# Patient Record
Sex: Female | Born: 1960 | Race: Black or African American | Hispanic: No | Marital: Single | Smoking: Former smoker
Health system: Southern US, Community
[De-identification: ages and names within clinical notes are randomized; demographics above are authoritative.]

## PROBLEM LIST (undated history)

## (undated) DIAGNOSIS — E039 Hypothyroidism, unspecified: Secondary | ICD-10-CM

## (undated) DIAGNOSIS — E785 Hyperlipidemia, unspecified: Secondary | ICD-10-CM

## (undated) DIAGNOSIS — I2510 Atherosclerotic heart disease of native coronary artery without angina pectoris: Secondary | ICD-10-CM

## (undated) DIAGNOSIS — I219 Acute myocardial infarction, unspecified: Secondary | ICD-10-CM

## (undated) DIAGNOSIS — F1010 Alcohol abuse, uncomplicated: Secondary | ICD-10-CM

## (undated) DIAGNOSIS — R7303 Prediabetes: Secondary | ICD-10-CM

## (undated) DIAGNOSIS — K429 Umbilical hernia without obstruction or gangrene: Secondary | ICD-10-CM

## (undated) DIAGNOSIS — F1410 Cocaine abuse, uncomplicated: Secondary | ICD-10-CM

## (undated) DIAGNOSIS — R7989 Other specified abnormal findings of blood chemistry: Secondary | ICD-10-CM

## (undated) DIAGNOSIS — M8580 Other specified disorders of bone density and structure, unspecified site: Secondary | ICD-10-CM

## (undated) DIAGNOSIS — I252 Old myocardial infarction: Secondary | ICD-10-CM

## (undated) DIAGNOSIS — K219 Gastro-esophageal reflux disease without esophagitis: Secondary | ICD-10-CM

## (undated) HISTORY — PX: CARDIAC CATHETERIZATION: SHX172

## (undated) HISTORY — DX: Old myocardial infarction: I25.2

## (undated) HISTORY — DX: Hypothyroidism, unspecified: E03.9

## (undated) HISTORY — DX: Alcohol abuse, uncomplicated: F10.10

## (undated) HISTORY — DX: Umbilical hernia without obstruction or gangrene: K42.9

## (undated) HISTORY — DX: Other specified disorders of bone density and structure, unspecified site: M85.80

## (undated) HISTORY — DX: Prediabetes: R73.03

## (undated) HISTORY — DX: Acute myocardial infarction, unspecified: I21.9

---

## 1979-10-10 HISTORY — PX: ABDOMINAL HYSTERECTOMY: SHX81

## 2004-11-14 ENCOUNTER — Ambulatory Visit: Payer: Self-pay | Admitting: Family Medicine

## 2004-12-13 ENCOUNTER — Emergency Department: Payer: Self-pay | Admitting: General Practice

## 2005-02-02 ENCOUNTER — Emergency Department: Payer: Self-pay | Admitting: General Practice

## 2005-05-08 ENCOUNTER — Emergency Department: Payer: Self-pay | Admitting: Unknown Physician Specialty

## 2006-02-23 ENCOUNTER — Emergency Department: Payer: Self-pay | Admitting: Unknown Physician Specialty

## 2008-08-31 ENCOUNTER — Emergency Department (HOSPITAL_BASED_OUTPATIENT_CLINIC_OR_DEPARTMENT_OTHER): Admission: EM | Admit: 2008-08-31 | Discharge: 2008-08-31 | Payer: Self-pay | Admitting: Emergency Medicine

## 2009-10-10 ENCOUNTER — Emergency Department (HOSPITAL_BASED_OUTPATIENT_CLINIC_OR_DEPARTMENT_OTHER): Admission: EM | Admit: 2009-10-10 | Discharge: 2009-10-10 | Payer: Self-pay | Admitting: Emergency Medicine

## 2010-06-19 ENCOUNTER — Emergency Department (HOSPITAL_BASED_OUTPATIENT_CLINIC_OR_DEPARTMENT_OTHER)
Admission: EM | Admit: 2010-06-19 | Discharge: 2010-06-20 | Payer: Self-pay | Source: Home / Self Care | Admitting: Emergency Medicine

## 2010-06-20 ENCOUNTER — Ambulatory Visit: Payer: Self-pay | Admitting: Diagnostic Radiology

## 2010-07-06 ENCOUNTER — Encounter: Payer: Self-pay | Admitting: Cardiology

## 2010-07-06 ENCOUNTER — Ambulatory Visit: Payer: Self-pay | Admitting: Cardiology

## 2010-07-06 ENCOUNTER — Encounter (INDEPENDENT_AMBULATORY_CARE_PROVIDER_SITE_OTHER): Payer: Self-pay | Admitting: *Deleted

## 2010-07-06 DIAGNOSIS — R079 Chest pain, unspecified: Secondary | ICD-10-CM

## 2010-07-06 DIAGNOSIS — I208 Other forms of angina pectoris: Secondary | ICD-10-CM

## 2010-07-06 DIAGNOSIS — F1910 Other psychoactive substance abuse, uncomplicated: Secondary | ICD-10-CM

## 2010-07-07 ENCOUNTER — Ambulatory Visit: Payer: Self-pay | Admitting: Cardiology

## 2010-07-07 ENCOUNTER — Inpatient Hospital Stay (HOSPITAL_BASED_OUTPATIENT_CLINIC_OR_DEPARTMENT_OTHER): Admission: RE | Admit: 2010-07-07 | Discharge: 2010-07-07 | Payer: Self-pay | Admitting: Cardiology

## 2010-07-07 LAB — CONVERTED CEMR LAB
BUN: 12 mg/dL (ref 6–23)
MCV: 80.8 fL (ref 78.0–100.0)
Platelets: 374 10*3/uL (ref 150–400)
Potassium: 4.5 meq/L (ref 3.5–5.3)
Prothrombin Time: 12.9 s (ref 11.6–15.2)
WBC: 7.1 10*3/uL (ref 4.0–10.5)

## 2010-07-13 ENCOUNTER — Ambulatory Visit: Payer: Self-pay | Admitting: Cardiology

## 2010-07-13 ENCOUNTER — Encounter: Payer: Self-pay | Admitting: Cardiology

## 2010-09-07 ENCOUNTER — Encounter: Payer: Self-pay | Admitting: Cardiology

## 2010-09-07 ENCOUNTER — Ambulatory Visit: Payer: Self-pay | Admitting: Cardiology

## 2010-10-09 DIAGNOSIS — I219 Acute myocardial infarction, unspecified: Secondary | ICD-10-CM

## 2010-10-09 HISTORY — DX: Acute myocardial infarction, unspecified: I21.9

## 2010-11-08 NOTE — Assessment & Plan Note (Signed)
Summary: [facility]   Visit Type:  Initial Consult  CC:  Sob and chest pains.  History of Present Illness: 50 year old female for evaluation of dyspnea. Seen in the emergency room on June 19, 2010 with a UTI and also dyspnea. Urinalysis revealed a UTI. Chest x-ray showed no acute abnormalities. Hemoglobin was 12.7. Renal function was normal. Cardiac markers was negative. Electrocardiogram revealed sinus bradycardia at a rate of 58. There were nonspecific anterior T wave changes. The patient states that over the past year she has had dyspnea on exertion relieved with rest. There is no orthopnea, PND, pedal edema or syncope. She also over the past 6 months has chest pain. It is substernal in location. It occurs with climbing stairs and resolves with rest. There is shortness of breath but no nausea vomiting or diaphoresis. She does not have these symptoms at rest. She does note occasional left shoulder pain but only since taking medications prescribed in the emergency room. She also has indigestion after eating certain types of food. Because of the above we were asked to further evaluate.  Current Medications (verified): 1)  Patient Does Not Take Any Medications R  Allergies (verified): 1)  ! * Ciprofloxacin 2)  ! Jonne Ply  Past History:  Past Medical History: No prior history  Past Surgical History: Partial hysterectomy  Family History: Reviewed history from 07/06/2010 and no changes required. Father died with CHF Mother with MI at age 33 and prior pacemaker  Social History: Reviewed history from 07/06/2010 and no changes required. She is a covering drug  (ETOH, Crack cocaine; none since January 2006). Former smoker (none since 2009) Former drinker Single 1 Daughter  Review of Systems       no fevers or chills, productive cough, hemoptysis, dysphasia, odynophagia, melena, hematochezia, dysuria, hematuria, rash, seizure activity, orthopnea, PND, pedal edema, claudication.  Remaining systems are negative.   Vital Signs:  Patient profile:   50 year old female Height:      66 inches Weight:      172.75 pounds BMI:     27.98 Pulse rate:   56 / minute Pulse rhythm:   regular Resp:     18 per minute BP sitting:   100 / 80  (left arm) Cuff size:   large  Vitals Entered By: Vikki Ports (July 06, 2010 10:31 AM)  Physical Exam  General:  Well developed/well nourished in NAD Skin warm/dry Patient not depressed No peripheral clubbing Back-normal HEENT-normal/normal eyelids Neck supple/normal carotid upstroke bilaterally; no bruits; no JVD; no thyromegaly chest - CTA/ normal expansion CV - RRR/normal S1 and S2; no murmurs, rubs or gallops;  PMI nondisplaced Abdomen -NT/ND, no HSM, no mass, + bowel sounds, no bruit 2+ femoral pulses, no bruits Ext-no edema, chords, 2+ DP Neuro-grossly nonfocal     EKG  Procedure date:  07/06/2010  Findings:      sinus bradycardia rate 56. Left axis deviation. Nonspecific T-wave changes.  Impression & Recommendations:  Problem # 1:  CHEST PAIN UNSPECIFIED (ICD-786.50) Patient's symptoms are concerning for coronary disease. She has a burning chest pain with climbing stairs which resolves with rest. She also has risk factors including prior tobacco use, cocaine use and a strong family history. Her electrocardiogram in the emergency room also showed anterior T-wave changes. I will begin enteric-coated aspirin 81 mg p.o. daily. We will proceed with left heart catheterization. The risks and benefits have been discussed including but not limited to myocardial infarction, CVA and death. She agrees to  proceed. This will be arranged in the outpatient laboratory. If coronary disease is demonstrated we will add a statin.  Problem # 2:  SUBSTANCE ABUSE (ICD-305.90) Now resolved for approximately 2 years.  Other Orders: T-Basic Metabolic Panel [phone]) T-CBC No Diff ([id number]) T-Protime, Auto  ([id number]) Cardiac Catheterization (Cardiac Cath)  Patient Instructions: 1)  Your physician recommends that you schedule a follow-up appointment in: 8 WEEKS 2)  Your physician has requested that you have a cardiac catheterization.  Cardiac catheterization is used to diagnose and/or treat various heart conditions. Doctors may recommend this procedure for a number of different reasons. The most common reason is to evaluate chest pain. Chest pain can be a symptom of coronary artery disease (CAD), and cardiac catheterization can show whether plaque is narrowing or blocking your heart's arteries. This procedure is also used to evaluate the valves, as well as measure the blood flow and oxygen levels in different parts of your heart.  For further information please visit [email or web address]  Please follow instruction sheet, as given.

## 2010-11-08 NOTE — Assessment & Plan Note (Signed)
Summary: [facility]   Visit Type:  Post-hospital  CC:  No complains.  History of Present Illness: 50 year old female I saw in Sept 2011 for evaluation of dyspnea and chest pain. Seen in the emergency room on June 19, 2010 with a UTI and also dyspnea. Urinalysis revealed a UTI. Chest x-ray showed no acute abnormalities. Hemoglobin was 12.7. Renal function was normal. Cardiac markers was negative. Electrocardiogram revealed sinus bradycardia at a rate of 58. There were nonspecific anterior T wave changes. We were concerned about her symptoms and scheduled a cardiac catheterization. This was performed in September of 2011 and revealed normal coronary arteries, normal LV function and normal pulmonary pressures. Since then the patient denies any dyspnea on exertion, orthopnea, PND, pedal edema, palpitations, syncope or chest pain.   Current Medications (verified): 1)  Patient Does Not Take Any Medications R  Allergies: 1)  ! * Ciprofloxacin 2)  ! Jonne Ply  Past History:  Past Medical History: Reviewed history from 07/06/2010 and no changes required. No prior history  Past Surgical History: Reviewed history from 07/06/2010 and no changes required. Partial hysterectomy  Social History: Reviewed history from 07/06/2010 and no changes required. She is a covering drug  (ETOH, Crack cocaine; none since January 2006). Former smoker (none since 2009) Former drinker Single 1 Daughter  Review of Systems       no fevers or chills, productive cough, hemoptysis, dysphasia, odynophagia, melena, hematochezia, dysuria, hematuria, rash, seizure activity, orthopnea, PND, pedal edema, claudication. Remaining systems are negative.   Vital Signs:  Patient profile:   50 year old female Height:      66 inches Weight:      174 pounds BMI:     28.19 Pulse rate:   68 / minute Pulse rhythm:   regular Resp:     18 per minute BP sitting:   120 / 70  (right arm) Cuff size:   large  Vitals  Entered By: Vikki Ports (July 13, 2010 10:28 AM)  Physical Exam  General:  Well-developed well-nourished in no acute distress.  Skin is warm and dry.  HEENT is normal.  Neck is supple. No thyromegaly.  Chest is clear to auscultation with normal expansion.  Cardiovascular exam is regular rate and rhythm.  Abdominal exam nontender or distended. No masses palpated. Right groin shows no hematoma and no bruit. Extremities show no edema. neuro grossly intact    Impression & Recommendations:  Problem # 1:  CHEST PAIN UNSPECIFIED (ICD-786.50) Patient's cardiac catheterization was normal. Her symptoms have resolved. No further workup.

## 2010-11-08 NOTE — Assessment & Plan Note (Signed)
Summary: [facility]   Visit Type:  Follow-up  CC:  Post-cath.  History of Present Illness: Pleasant female I saw in Sept 2011 for evaluation of dyspnea and chest pain. Seen in the emergency room on June 19, 2010 with a UTI and also dyspnea. Urinalysis revealed a UTI. Chest x-ray showed no acute abnormalities. Hemoglobin was 12.7. Renal function was normal. Cardiac markers was negative. Electrocardiogram revealed sinus bradycardia at a rate of 58. There were nonspecific anterior T wave changes. We were concerned about her symptoms and scheduled a cardiac catheterization. This was performed in September of 2011 and revealed normal coronary arteries, normal LV function and normal pulmonary pressures. I last saw her in Oct 2011. Since then, the patient denies any dyspnea on exertion, orthopnea, PND, pedal edema, palpitations, syncope or chest pain.    Current Medications (verified): 1)  Patient Does Not Take Any Medications R  Allergies: 1)  ! * Ciprofloxacin 2)  ! Jonne Ply  Past History:  Past Medical History: Reviewed history from 07/06/2010 and no changes required. No prior history  Past Surgical History: Reviewed history from 07/06/2010 and no changes required. Partial hysterectomy  Social History: Reviewed history from 07/06/2010 and no changes required. She is a covering drug  (ETOH, Crack cocaine; none since January 2006). Former smoker (none since 2009) Former drinker Single 1 Daughter  Review of Systems       no fevers or chills, productive cough, hemoptysis, dysphasia, odynophagia, melena, hematochezia, dysuria, hematuria, rash, seizure activity, orthopnea, PND, pedal edema, claudication. Remaining systems are negative.   Vital Signs:  Patient profile:   50 year old female Height:      66 inches Weight:      178 pounds BMI:     28.83 Pulse rate:   70 / minute Pulse rhythm:   regular Resp:     18 per minute BP sitting:   110 / 76  (left arm) Cuff size:    large  Vitals Entered By: Vikki Ports (September 07, 2010 10:26 AM)  Physical Exam  General:  Well-developed well-nourished in no acute distress.  Skin is warm and dry.  HEENT is normal.  Neck is supple. No thyromegaly.  Chest is clear to auscultation with normal expansion.  Cardiovascular exam is regular rate and rhythm.  Abdominal exam nontender or distended. No masses palpated. Extremities show no edema. neuro grossly intact    EKG  Procedure date:  09/07/2010  Findings:      Sinus rhythm with nonspecific anterior T-wave changes.  Impression & Recommendations:  Problem # 1:  CHEST PAIN UNSPECIFIED (ICD-786.50) No further symptoms. Cardiac catheterization normal. No further evaluation. I have given her Dr. Olegario Messier card and she will contact him for her primary care needs.

## 2010-11-08 NOTE — Letter (Signed)
Summary: Cardiac Catheterization Instructions- [facility] Lab  [facility]  [address]   [place], [place] [address]   Phone: [phone]  Fax:      07/06/2010 MRN: [id number]  Kaitlyn Bowman [address] [place], [place]  [address]  Dear [name],   You are scheduled for a Cardiac Catheterization on THURSDAY 07-07-10 with Dr. Juanda Chance  Please arrive to the 1st floor of the [facility] at [facility] at    9:30  am      on the day of your procedure. Please do not arrive before 6:30 a.m. Call the Heart and Vascular Center at [phone] if you are unable to make your appointmnet. The Code to get into the parking garage under the building is 0200. Take the elevators to the 1st floor. You must have someone to drive you home. Someone must be with you for the first 24 hours after you arrive home. Please wear clothes that are easy to get on and off and wear slip-on shoes. Do not eat or drink after midnight except water with your medications that morning. Bring all your medications and current insurance cards with you.  ___ DO NOT take these medications before your procedure: ________________________________________________________________  ___ Make sure you take your aspirin.  ___ You may take ALL of your medications with water that morning. ________________________________________________________________________________________________________________________________  ___ DO NOT take ANY medications before your procedure.  ___ Pre-med instructions:  ________________________________________________________________________________________________________________________________  The usual length of stay after your procedure is 2 to 3 hours. This can vary.  If you have any questions, please call the office at the number listed above.   Deliah Goody, RN

## 2010-12-22 LAB — URINALYSIS, ROUTINE W REFLEX MICROSCOPIC
Bilirubin Urine: NEGATIVE
Glucose, UA: NEGATIVE mg/dL
Hgb urine dipstick: NEGATIVE
Ketones, ur: NEGATIVE mg/dL
Nitrite: POSITIVE — AB
Protein, ur: NEGATIVE mg/dL
Specific Gravity, Urine: 1.014 (ref 1.005–1.030)
Urobilinogen, UA: 1 mg/dL (ref 0.0–1.0)
pH: 6.5 (ref 5.0–8.0)

## 2010-12-22 LAB — DIFFERENTIAL
Basophils Absolute: 0.2 10*3/uL — ABNORMAL HIGH (ref 0.0–0.1)
Eosinophils Absolute: 0.3 10*3/uL (ref 0.0–0.7)
Lymphocytes Relative: 57 % — ABNORMAL HIGH (ref 12–46)
Lymphs Abs: 6.1 10*3/uL — ABNORMAL HIGH (ref 0.7–4.0)
Monocytes Relative: 4 % (ref 3–12)

## 2010-12-22 LAB — URINE MICROSCOPIC-ADD ON

## 2010-12-22 LAB — POCT CARDIAC MARKERS
CKMB, poc: 1.2 ng/mL (ref 1.0–8.0)
Troponin i, poc: <0.05 ng/mL (ref 0.00–0.09)

## 2010-12-22 LAB — POCT I-STAT 3, VENOUS BLOOD GAS (G3P V)
Acid-base deficit: 4 mmol/L — ABNORMAL HIGH (ref 0.0–2.0)
pO2, Ven: 36 mmHg (ref 30.0–45.0)

## 2010-12-22 LAB — CBC
MCHC: 32.5 g/dL (ref 30.0–36.0)
MCV: 81.7 fL (ref 78.0–100.0)
Platelets: 318 10*3/uL (ref 150–400)
RDW: 14.5 % (ref 11.5–15.5)
WBC: 10.6 10*3/uL — ABNORMAL HIGH (ref 4.0–10.5)

## 2010-12-22 LAB — POCT I-STAT 3, ART BLOOD GAS (G3+)
pCO2 arterial: 38.2 mmHg (ref 35.0–45.0)
pH, Arterial: 7.37 (ref 7.350–7.400)

## 2010-12-22 LAB — BASIC METABOLIC PANEL
BUN: 15 mg/dL (ref 6–23)
Creatinine, Ser: 0.8 mg/dL (ref 0.4–1.2)
GFR calc non Af Amer: >60 mL/min (ref >60)

## 2010-12-22 LAB — PROTIME-INR
INR: 0.96 (ref 0.00–1.49)
Prothrombin Time: 13 seconds (ref 11.6–15.2)

## 2011-05-22 ENCOUNTER — Encounter: Payer: Self-pay | Admitting: Cardiology

## 2011-07-12 LAB — URINALYSIS, ROUTINE W REFLEX MICROSCOPIC
Ketones, ur: NEGATIVE
Nitrite: NEGATIVE
Protein, ur: NEGATIVE
Urobilinogen, UA: 0.2
pH: 6.5

## 2011-07-12 LAB — GLUCOSE, CAPILLARY

## 2011-08-25 ENCOUNTER — Emergency Department (HOSPITAL_BASED_OUTPATIENT_CLINIC_OR_DEPARTMENT_OTHER)
Admission: EM | Admit: 2011-08-25 | Discharge: 2011-08-25 | Disposition: A | Discharge status: Home / Self Care | Payer: BC Managed Care – PPO | Attending: Emergency Medicine | Admitting: Emergency Medicine

## 2011-08-25 ENCOUNTER — Encounter (HOSPITAL_BASED_OUTPATIENT_CLINIC_OR_DEPARTMENT_OTHER): Payer: Self-pay | Admitting: *Deleted

## 2011-08-25 DIAGNOSIS — J069 Acute upper respiratory infection, unspecified: Secondary | ICD-10-CM | POA: Insufficient documentation

## 2011-08-25 MED ORDER — PSEUDOEPHEDRINE-GUAIFENESIN ER 60-600 MG PO TB12: 1.0000 | ORAL_TABLET | Freq: Two times a day (BID) | ORAL | Status: DC

## 2011-08-25 NOTE — ED Notes (Signed)
Patient states she developed a sore throat  2 days ago.  Now had progressed to bodyaches, productive cough with yellow secretions, intermittent fever and chills.  Treating with OTC multiple symptom cold meds with minimal relief.

## 2011-08-25 NOTE — ED Provider Notes (Signed)
History     CSN: [id number] Arrival date & time: 08/25/2011  9:02 AM   First MD Initiated Contact with Patient 08/25/11 [id number]      Chief Complaint  Patient presents with  . URI    cough, body aches, fever, chills    (Consider location/radiation/quality/duration/timing/severity/associated sxs/prior treatment) HPI Comments: Has tried multiple over-the-counter remedies without significant relief  Patient is a 50 y.o. female presenting with URI. The history is provided by the patient. No language interpreter was used.  URI The primary symptoms include fatigue, sore throat and cough. Primary symptoms do not include fever, headaches, ear pain, wheezing, abdominal pain, nausea, vomiting or arthralgias. The current episode started 2 days ago. This is a new problem. The problem has been gradually worsening.  The fatigue began 2 days ago. The fatigue has been unchanged since its onset.  The cough began 2 days ago. The cough is new. The cough is productive. The sputum is yellow.  Symptoms associated with the illness include congestion. The illness is not associated with chills or rhinorrhea.    History reviewed. No pertinent past medical history.  Past Surgical History  Procedure Date  . Partial hysterectomy   . Cardiac catheterization 2010    Negative    Family History  Problem Relation Age of Onset  . Heart attack Mother 73    prior pacemaker  . Heart failure Father     History  Substance Use Topics  . Smoking status: Former Games developer  . Smokeless tobacco: Not on file   Comment: None since 2009   . Alcohol Use: No     former    OB History    Grav Para Term Preterm Abortions TAB SAB Ect Mult Living                  Review of Systems  Constitutional: Positive for activity change and fatigue. Negative for fever, chills and appetite change.  HENT: Positive for congestion and sore throat. Negative for ear pain, rhinorrhea, neck pain and neck stiffness.   Respiratory:  Positive for cough. Negative for shortness of breath and wheezing.   Cardiovascular: Negative for chest pain and palpitations.  Gastrointestinal: Negative for nausea, vomiting, abdominal pain, diarrhea and constipation.  Genitourinary: Negative for dysuria, urgency, frequency and flank pain.  Musculoskeletal: Negative for back pain and arthralgias.  Neurological: Negative for dizziness, weakness, numbness and headaches.  All other systems reviewed and are negative.    Allergies  Aspirin and Ciprofloxacin  Home Medications   Current Outpatient Rx  Name Route Sig Dispense Refill  . PSEUDOEPHEDRINE-GUAIFENESIN 60-600 MG PO TB12 Oral Take 1 tablet by mouth every 12 (twelve) hours. 20 tablet 0    BP 125/79  Pulse 69  Temp(Src) 97.9 F (36.6 C) (Oral)  Resp 20  Ht 5\' 6"  (1.676 m)  Wt 182 lb (82.555 kg)  BMI 29.38 kg/m2  SpO2 98%  Physical Exam  Nursing note and vitals reviewed. Constitutional: She is oriented to person, place, and time. She appears well-developed and well-nourished. No distress.  HENT:  Head: Normocephalic and atraumatic.  Right Ear: External ear normal.  Left Ear: External ear normal.  Mouth/Throat: Oropharynx is clear and moist.  Eyes: Conjunctivae and EOM are normal. Pupils are equal, round, and reactive to light.  Neck: Normal range of motion. Neck supple.  Cardiovascular: Normal rate, regular rhythm, normal heart sounds and intact distal pulses.  Exam reveals no gallop and no friction rub.   No murmur heard. Pulmonary/Chest:  Effort normal and breath sounds normal. No respiratory distress.  Abdominal: Soft. Bowel sounds are normal. There is no tenderness.  Musculoskeletal: Normal range of motion. She exhibits no tenderness.  Neurological: She is alert and oriented to person, place, and time.  Skin: Skin is warm and dry. No rash noted.    ED Course  Procedures (including critical care time)  Labs Reviewed - No data to display No results  found.   1. Upper respiratory infection       MDM  Symptoms consistent with a upper respiratory infection. I will prescribe Mucinex D and instruct her to take ibuprofen for myalgias and to drink plenty of fluids. I explained that this is a viral cause and antibiotics are not indicated. She's instructed to followup with her primary care physician in one week        Dayton Bailiff, MD 08/25/11 1004

## 2012-01-08 DIAGNOSIS — I252 Old myocardial infarction: Secondary | ICD-10-CM

## 2012-01-08 HISTORY — DX: Old myocardial infarction: I25.2

## 2012-01-12 ENCOUNTER — Emergency Department (HOSPITAL_BASED_OUTPATIENT_CLINIC_OR_DEPARTMENT_OTHER): Payer: BC Managed Care – PPO

## 2012-01-12 ENCOUNTER — Other Ambulatory Visit: Payer: Self-pay

## 2012-01-12 ENCOUNTER — Encounter (HOSPITAL_COMMUNITY)
Admission: EM | Disposition: A | Discharge status: Home / Self Care | Payer: Self-pay | Source: Home / Self Care | Attending: Cardiovascular Disease

## 2012-01-12 ENCOUNTER — Inpatient Hospital Stay (HOSPITAL_BASED_OUTPATIENT_CLINIC_OR_DEPARTMENT_OTHER)
Admission: EM | Admit: 2012-01-12 | Discharge: 2012-01-15 | DRG: 853 | Disposition: A | Discharge status: Home / Self Care | Payer: BC Managed Care – PPO | Attending: Cardiovascular Disease | Admitting: Cardiovascular Disease

## 2012-01-12 ENCOUNTER — Ambulatory Visit (HOSPITAL_COMMUNITY): Admit: 2012-01-12 | Payer: Self-pay | Admitting: Cardiovascular Disease

## 2012-01-12 ENCOUNTER — Encounter (HOSPITAL_BASED_OUTPATIENT_CLINIC_OR_DEPARTMENT_OTHER): Payer: Self-pay | Admitting: *Deleted

## 2012-01-12 DIAGNOSIS — I2119 ST elevation (STEMI) myocardial infarction involving other coronary artery of inferior wall: Secondary | ICD-10-CM

## 2012-01-12 DIAGNOSIS — Z87891 Personal history of nicotine dependence: Secondary | ICD-10-CM

## 2012-01-12 DIAGNOSIS — I2510 Atherosclerotic heart disease of native coronary artery without angina pectoris: Secondary | ICD-10-CM

## 2012-01-12 DIAGNOSIS — I213 ST elevation (STEMI) myocardial infarction of unspecified site: Secondary | ICD-10-CM

## 2012-01-12 DIAGNOSIS — E039 Hypothyroidism, unspecified: Secondary | ICD-10-CM | POA: Diagnosis present

## 2012-01-12 DIAGNOSIS — R079 Chest pain, unspecified: Secondary | ICD-10-CM

## 2012-01-12 DIAGNOSIS — E785 Hyperlipidemia, unspecified: Secondary | ICD-10-CM | POA: Diagnosis present

## 2012-01-12 DIAGNOSIS — Z8639 Personal history of other endocrine, nutritional and metabolic disease: Secondary | ICD-10-CM

## 2012-01-12 HISTORY — DX: Cocaine abuse, uncomplicated: F14.10

## 2012-01-12 HISTORY — DX: Other specified abnormal findings of blood chemistry: R79.89

## 2012-01-12 HISTORY — DX: Atherosclerotic heart disease of native coronary artery without angina pectoris: I25.10

## 2012-01-12 HISTORY — PX: LEFT HEART CATHETERIZATION WITH CORONARY ANGIOGRAM: SHX5451

## 2012-01-12 HISTORY — DX: Hyperlipidemia, unspecified: E78.5

## 2012-01-12 HISTORY — DX: Gastro-esophageal reflux disease without esophagitis: K21.9

## 2012-01-12 LAB — CARDIAC PANEL(CRET KIN+CKTOT+MB+TROPI)
CK, MB: 8 ng/mL (ref 0.3–4.0)
Troponin I: 1.72 ng/mL (ref <0.30)

## 2012-01-12 LAB — COMPREHENSIVE METABOLIC PANEL
BUN: 12 mg/dL (ref 6–23)
CO2: 22 mEq/L (ref 19–32)
Calcium: 8.8 mg/dL (ref 8.4–10.5)
Creatinine, Ser: 0.62 mg/dL (ref 0.50–1.10)
GFR calc Af Amer: >90 mL/min (ref >90)
GFR calc non Af Amer: >90 mL/min (ref >90)
Glucose, Bld: 103 mg/dL — ABNORMAL HIGH (ref 70–99)
Sodium: 137 mEq/L (ref 135–145)
Total Protein: 6.9 g/dL (ref 6.0–8.3)

## 2012-01-12 LAB — MAGNESIUM: Magnesium: 1.9 mg/dL (ref 1.5–2.5)

## 2012-01-12 LAB — DIFFERENTIAL
Basophils Relative: 0 % (ref 0–1)
Eosinophils Absolute: 0.1 10*3/uL (ref 0.0–0.7)
Eosinophils Relative: 1 % (ref 0–5)
Lymphs Abs: 4.6 10*3/uL — ABNORMAL HIGH (ref 0.7–4.0)
Monocytes Relative: 5 % (ref 3–12)
Neutrophils Relative %: 61 % (ref 43–77)

## 2012-01-12 LAB — PROTIME-INR
INR: 2.1 — ABNORMAL HIGH (ref 0.00–1.49)
Prothrombin Time: 23.9 seconds — ABNORMAL HIGH (ref 11.6–15.2)

## 2012-01-12 LAB — CBC
Hemoglobin: 10.7 g/dL — ABNORMAL LOW (ref 12.0–15.0)
MCH: 25.5 pg — ABNORMAL LOW (ref 26.0–34.0)
MCHC: 32.5 g/dL (ref 30.0–36.0)
MCV: 78.3 fL (ref 78.0–100.0)
RBC: 4.2 MIL/uL (ref 3.87–5.11)

## 2012-01-12 SURGERY — LEFT HEART CATHETERIZATION WITH CORONARY ANGIOGRAM
Anesthesia: Moderate Sedation

## 2012-01-12 MED ORDER — ONDANSETRON HCL 4 MG/2ML IJ SOLN: 4.0000 mg | Freq: Four times a day (QID) | INTRAMUSCULAR | Status: DC | PRN

## 2012-01-12 MED ORDER — ONDANSETRON HCL 4 MG/2ML IJ SOLN
4.0000 mg | Freq: Four times a day (QID) | INTRAMUSCULAR | Status: DC | PRN
  Administered 2012-01-13: 2 mg via INTRAVENOUS
  Filled 2012-01-12: qty 2

## 2012-01-12 MED ORDER — DOPAMINE-DEXTROSE 3.2-5 MG/ML-% IV SOLN
INTRAVENOUS | Status: AC
  Filled 2012-01-12: qty 250

## 2012-01-12 MED ORDER — HEPARIN (PORCINE) IN NACL 2-0.9 UNIT/ML-% IJ SOLN
INTRAMUSCULAR | Status: AC
  Filled 2012-01-12: qty 2000

## 2012-01-12 MED ORDER — SODIUM CHLORIDE 0.9 % IV SOLN
0.2500 mg/kg/h | INTRAVENOUS | Status: AC
  Filled 2012-01-12: qty 250

## 2012-01-12 MED ORDER — LIDOCAINE HCL (PF) 1 % IJ SOLN
INTRAMUSCULAR | Status: AC
  Filled 2012-01-12: qty 30

## 2012-01-12 MED ORDER — ZOLPIDEM TARTRATE 5 MG PO TABS
10.0000 mg | ORAL_TABLET | Freq: Every evening | ORAL | Status: DC | PRN
  Administered 2012-01-12: 10 mg via ORAL
  Filled 2012-01-12: qty 2

## 2012-01-12 MED ORDER — ACETAMINOPHEN 325 MG PO TABS: 650.0000 mg | ORAL_TABLET | ORAL | Status: DC | PRN

## 2012-01-12 MED ORDER — HEPARIN (PORCINE) IN NACL 100-0.45 UNIT/ML-% IJ SOLN
16.0000 [IU]/kg/h | Freq: Once | INTRAMUSCULAR | Status: AC
  Administered 2012-01-12: 16 [IU]/kg/h via INTRAVENOUS
  Filled 2012-01-12: qty 250

## 2012-01-12 MED ORDER — ASPIRIN 81 MG PO CHEW
CHEWABLE_TABLET | ORAL | Status: AC
  Filled 2012-01-12: qty 4

## 2012-01-12 MED ORDER — NITROGLYCERIN 0.4 MG SL SUBL
0.4000 mg | SUBLINGUAL_TABLET | SUBLINGUAL | Status: DC | PRN
  Administered 2012-01-13: 0.4 mg via SUBLINGUAL
  Filled 2012-01-12: qty 75

## 2012-01-12 MED ORDER — NITROGLYCERIN 0.2 MG/ML ON CALL CATH LAB
INTRAVENOUS | Status: AC
  Filled 2012-01-12: qty 1

## 2012-01-12 MED ORDER — NITROGLYCERIN IN D5W 200-5 MCG/ML-% IV SOLN
INTRAVENOUS | Status: AC
  Administered 2012-01-12: 10 ug/min via INTRAVENOUS
  Filled 2012-01-12: qty 250

## 2012-01-12 MED ORDER — MIDAZOLAM HCL 2 MG/2ML IJ SOLN
INTRAMUSCULAR | Status: AC
  Filled 2012-01-12: qty 2

## 2012-01-12 MED ORDER — MORPHINE SULFATE 2 MG/ML IJ SOLN
2.0000 mg | Freq: Once | INTRAMUSCULAR | Status: AC
  Administered 2012-01-12: 2 mg via INTRAVENOUS

## 2012-01-12 MED ORDER — TICAGRELOR 90 MG PO TABS
ORAL_TABLET | ORAL | Status: AC
  Administered 2012-01-13: 90 mg via ORAL
  Filled 2012-01-12: qty 2

## 2012-01-12 MED ORDER — OXYCODONE-ACETAMINOPHEN 5-325 MG PO TABS
1.0000 | ORAL_TABLET | ORAL | Status: DC | PRN
  Administered 2012-01-14: 1 via ORAL
  Filled 2012-01-12: qty 1

## 2012-01-12 MED ORDER — FENTANYL CITRATE 0.05 MG/ML IJ SOLN
INTRAMUSCULAR | Status: AC
  Filled 2012-01-12: qty 2

## 2012-01-12 MED ORDER — ONDANSETRON HCL 4 MG/2ML IJ SOLN
INTRAMUSCULAR | Status: AC
  Administered 2012-01-13: 2 mg via INTRAVENOUS
  Filled 2012-01-12: qty 2

## 2012-01-12 MED ORDER — TICAGRELOR 90 MG PO TABS
90.0000 mg | ORAL_TABLET | Freq: Two times a day (BID) | ORAL | Status: DC
  Administered 2012-01-12 – 2012-01-15 (×6): 90 mg via ORAL
  Filled 2012-01-12 (×7): qty 1

## 2012-01-12 MED ORDER — ASPIRIN 81 MG PO CHEW
243.0000 mg | CHEWABLE_TABLET | Freq: Once | ORAL | Status: AC
  Administered 2012-01-12: 243 mg via ORAL

## 2012-01-12 MED ORDER — ATORVASTATIN CALCIUM 80 MG PO TABS
80.0000 mg | ORAL_TABLET | Freq: Every day | ORAL | Status: DC
  Administered 2012-01-13 – 2012-01-14 (×2): 80 mg via ORAL
  Filled 2012-01-12 (×3): qty 1

## 2012-01-12 MED ORDER — MORPHINE SULFATE 2 MG/ML IJ SOLN
INTRAMUSCULAR | Status: AC
  Filled 2012-01-12: qty 1

## 2012-01-12 MED ORDER — BIVALIRUDIN 250 MG IV SOLR
INTRAVENOUS | Status: AC
  Filled 2012-01-12: qty 250

## 2012-01-12 MED ORDER — ASPIRIN EC 81 MG PO TBEC: 81.0000 mg | DELAYED_RELEASE_TABLET | Freq: Every day | ORAL | Status: DC

## 2012-01-12 MED ORDER — SODIUM CHLORIDE 0.9 % IV SOLN
INTRAVENOUS | Status: AC
  Administered 2012-01-12: 23:00:00 via INTRAVENOUS

## 2012-01-12 MED ORDER — ASPIRIN 81 MG PO CHEW
81.0000 mg | CHEWABLE_TABLET | Freq: Every day | ORAL | Status: DC
  Administered 2012-01-13 – 2012-01-15 (×3): 81 mg via ORAL
  Filled 2012-01-12 (×3): qty 1

## 2012-01-12 MED ORDER — NITROGLYCERIN IN D5W 200-5 MCG/ML-% IV SOLN
2.0000 ug/min | INTRAVENOUS | Status: DC
  Administered 2012-01-12 (×2): 10 ug/min via INTRAVENOUS

## 2012-01-12 MED ORDER — HEPARIN BOLUS VIA INFUSION
4000.0000 [IU] | Freq: Once | INTRAVENOUS | Status: AC
  Administered 2012-01-12: 4000 [IU] via INTRAVENOUS

## 2012-01-12 NOTE — Progress Notes (Signed)
Spiritual Care:  Paged at 1946 for a CODE STEMI arriving from [facility]. There was no ETA so I went to the Cath Lab and found that the pt was at least twenty minutes out and would go directly to the Cath Lab bypassing the ED. I left word at both the ED and Short Stay entrances to be paged should family arrive. Was paged at 2058 that family had arrived at the ED. I escorted the family members to Waiting Room 4 and provided comfort and care until 2140 when the attending physician briefed family on the success of the procedure. I escorted family to Unit [address] and requested a nurse alert the family in the 2900 waiting room when [name] was ready for visitors.  Ms Kaitlyn Bowman is a 51 year old female pt who has had one cath experience prior to this one, which was caused by a 100% blockage. She got through the procedure with the blockage opened. Family much relieved.  Benjie Karvonen. Paloma Grange, APC, D.Min. Chaplain 10:02 PM   01/12/2012

## 2012-01-12 NOTE — ED Notes (Signed)
Pt c/o bilat upper chest pain that began at apprx. 1330 hrs while eating lunch. Pt sts pain became worse on the way to the ER.

## 2012-01-12 NOTE — ED Provider Notes (Signed)
History     CSN: [id number]  Arrival date & time 01/12/12  Ernestina Columbia   First MD Initiated Contact with Patient 01/12/12 1930      Chief Complaint  Patient presents with  . Chest Pain    (Consider location/radiation/quality/duration/timing/severity/associated sxs/prior treatment) HPI Comments: Patient presents with substernal chest pain the first onset around 1:30 PM while eating lunch. It waxed and waned throughout the afternoon to come more severe in the last 30 minutes. Associated with shortness of breath and nausea and sweating. The pain radiates to her neck and arm. History is limited given patient's active MI. She reports having a clean catheterization in 2011.  The history is provided by the patient. The history is limited by the condition of the patient.    History reviewed. No pertinent past medical history.  Past Surgical History  Procedure Date  . Partial hysterectomy   . Cardiac catheterization 2010    Negative    Family History  Problem Relation Age of Onset  . Heart attack Mother 26    prior pacemaker  . Heart failure Father     History  Substance Use Topics  . Smoking status: Former Games developer  . Smokeless tobacco: Not on file   Comment: None since 2009   . Alcohol Use: No     former    OB History    Grav Para Term Preterm Abortions TAB SAB Ect Mult Living                  Review of Systems  Unable to perform ROS: Unstable vital signs  Cardiovascular: Positive for chest pain.    Allergies  Ciprofloxacin  Home Medications   Current Outpatient Rx  Name Route Sig Dispense Refill  . PSEUDOEPHEDRINE-GUAIFENESIN ER 60-600 MG PO TB12 Oral Take 1 tablet by mouth every 12 (twelve) hours. 20 tablet 0    BP 131/80  Pulse 75  Temp(Src) 98.3 F (36.8 C) (Oral)  Resp 18  Wt 185 lb (83.915 kg)  SpO2 100%  Physical Exam  Constitutional: She is oriented to person, place, and time. She appears well-developed and well-nourished. She appears distressed.     Anxious, diaphoretic  HENT:  Head: Normocephalic and atraumatic.  Mouth/Throat: Oropharynx is clear and moist. No oropharyngeal exudate.  Eyes: Conjunctivae are normal. Pupils are equal, round, and reactive to light.  Neck: Normal range of motion. Neck supple.  Cardiovascular: Normal rate and normal heart sounds.   No murmur heard. Pulmonary/Chest: Effort normal and breath sounds normal. No respiratory distress. She exhibits no tenderness.  Abdominal: Soft. There is no tenderness. There is no rebound and no guarding.  Musculoskeletal: Normal range of motion. She exhibits no edema and no tenderness.  Neurological: She is oriented to person, place, and time. No cranial nerve deficit.  Skin: Skin is warm.    ED Course  Procedures (including critical care time)   Labs Reviewed  CBC  DIFFERENTIAL  COMPREHENSIVE METABOLIC PANEL  TROPONIN I   No results found.   1. STEMI (ST elevation myocardial infarction)       MDM  Substernal chest pain with shortness of breath, diaphoresis, nausea. Vitals stable. Inferior STEMI on EKG with ST depressions in leads 1, aVL and V2 suggestive of right ventricle involvement.  Code STEMI activated.  Given ASA, NTG, Heparin.  Nitro gtt and heparin gtt begun.  D/w Dr. Clifton James.  EMS here to transport patient to cath lab.   Date: 01/12/2012  Rate: 65  Rhythm: normal  sinus rhythm  QRS Axis: normal  Intervals: normal  ST/T Wave abnormalities: ST elevations inferiorly and ST depressions anteriorly  Conduction Disutrbances:none  Narrative Interpretation:   Old EKG Reviewed: changes noted  CRITICAL CARE Performed by: Glynn Octave   Total critical care time: 30  Critical care time was exclusive of separately billable procedures and treating other patients.  Critical care was necessary to treat or prevent imminent or life-threatening deterioration.  Critical care was time spent personally by me on the following activities: development of  treatment plan with patient and/or surrogate as well as nursing, discussions with consultants, evaluation of patient's response to treatment, examination of patient, obtaining history from patient or surrogate, ordering and performing treatments and interventions, ordering and review of laboratory studies, ordering and review of radiographic studies, pulse oximetry and re-evaluation of patient's condition.   Glynn Octave, MD 01/12/12 2000

## 2012-01-12 NOTE — CV Procedure (Signed)
   Cardiac Catheterization Operative Report  Kaitlyn Bowman [id number] 4/5/20139:22 PM No primary provider on file.  Procedure Performed:  1. Left Heart Catheterization 2. Selective Coronary Angiography 3. Left ventricular angiogram 4. PTCA/DES x 1 mid RCA  Operator: Verne Carrow, MD  Indication: Inferior STEMI                                      Procedure Details: The pt was brought emergently to the cath lab by EMS. EKG with inferior ST elevation. Pt with ongoing chest pain. Emergency consent obtained. I explained risks, benefits, complications, treatment options, and expected outcomes with the patient. The patient agree with the plan. The patient was sedated with Versed and Fentanyl. Allens test was negative on the right wrist. The right groin was prepped and draped in the usual manner. Using the modified Seldinger access technique, a 6 French sheath was placed in the right femoral artery. Standard diagnostic catheters were used to perform selective coronary angiography. The RCA was found to be occluded. The patient was given a bolus of Angiomax and a drip was started. She was given Brilinta 180 mg po x 1. When the ACT was greater than 200, I engaged the RCA with a 6 [place] JR-4 guiding catheter. A Cougar IC wire was advanced down the RCA. Flow was re-established with the wire. A 2.5 x 12 mm balloon was used to pre-dilate the stenosis. A 2.5 x 20 mm Promus Element DES was deployed in the mid vessel. This was post-dilated with a 2.75 x 15 mm [place] balloon. There was an excellent result with excellent flow down the vessel. The guide and wire were removed.  A pigtail catheter was used to perform a left ventricular angiogram.  There were no immediate complications. The patient was taken to the CCU in stable condition.   Hemodynamic Findings: Central aortic pressure: 100/64 Left ventricular pressure: 103/10/23  Angiographic Findings:  Left main: No obstructive disease.   Left Anterior  Descending Artery: Large vessel that courses to the apex. Mild luminal irregularities in the mid vessel. Moderate sized diagonal branch with no disease.   Circumflex Artery: Large caliber vessel with serial 20% lesions in the mid vessel. OM1 is moderate sized with no disease. The second and third marginals are small. The fourth OM branch is moderate sized and has a 30% proximal stenosis.   Right Coronary Artery: Dominant vessel with 100% mid occlusion.   Left Ventricular Angiogram: LVEF 45%. Inferior wall hypokinesis.   Impression: 1. Acute inferior STEMI with occluded mid RCA 2. Successful PTCA/DES x 1 in the mid RCA 3. Hypokinesis of the inferior wall  Recommendations: She will need ASA and Brilinta for at least one year. Will add beta blocker as BP tolerates. High dose statin tonight.        Complications:  None. The patient tolerated the procedure well.

## 2012-01-12 NOTE — ED Notes (Signed)
[place] here for tranport

## 2012-01-12 NOTE — ED Notes (Signed)
Attempt to call report to report to cath report

## 2012-01-13 ENCOUNTER — Other Ambulatory Visit: Payer: Self-pay

## 2012-01-13 DIAGNOSIS — I219 Acute myocardial infarction, unspecified: Secondary | ICD-10-CM

## 2012-01-13 LAB — RAPID URINE DRUG SCREEN, HOSP PERFORMED
Benzodiazepines: POSITIVE — AB
Cocaine: NOT DETECTED

## 2012-01-13 LAB — LIPID PANEL
LDL Cholesterol: 197 mg/dL — ABNORMAL HIGH (ref 0–99)
Triglycerides: 201 mg/dL — ABNORMAL HIGH (ref <150)
VLDL: 40 mg/dL (ref 0–40)

## 2012-01-13 LAB — CBC
Hemoglobin: 10.7 g/dL — ABNORMAL LOW (ref 12.0–15.0)
MCH: 26.1 pg (ref 26.0–34.0)
Platelets: 247 10*3/uL (ref 150–400)
RBC: 4.1 MIL/uL (ref 3.87–5.11)
WBC: 10.5 10*3/uL (ref 4.0–10.5)

## 2012-01-13 LAB — BASIC METABOLIC PANEL
CO2: 23 mEq/L (ref 19–32)
Calcium: 9.1 mg/dL (ref 8.4–10.5)
Chloride: 107 mEq/L (ref 96–112)
Glucose, Bld: 104 mg/dL — ABNORMAL HIGH (ref 70–99)
Potassium: 4.3 mEq/L (ref 3.5–5.1)
Sodium: 139 mEq/L (ref 135–145)

## 2012-01-13 LAB — CARDIAC PANEL(CRET KIN+CKTOT+MB+TROPI)
CK, MB: 26.3 ng/mL (ref 0.3–4.0)
Total CK: 324 U/L — ABNORMAL HIGH (ref 7–177)
Troponin I: 6.33 ng/mL (ref <0.30)
Troponin I: 4.85 ng/mL (ref <0.30)

## 2012-01-13 MED ORDER — ATROPINE SULFATE 1 MG/ML IJ SOLN
INTRAMUSCULAR | Status: AC
  Filled 2012-01-13: qty 1

## 2012-01-13 MED ORDER — AMLODIPINE BESYLATE 5 MG PO TABS: 5.0000 mg | ORAL_TABLET | Freq: Every day | ORAL | Status: DC

## 2012-01-13 MED ORDER — SODIUM CHLORIDE 0.9 % IJ SOLN
3.0000 mL | Freq: Two times a day (BID) | INTRAMUSCULAR | Status: DC
  Administered 2012-01-13 – 2012-01-14 (×2): 3 mL via INTRAVENOUS

## 2012-01-13 MED ORDER — CARVEDILOL 3.125 MG PO TABS
3.1250 mg | ORAL_TABLET | Freq: Two times a day (BID) | ORAL | Status: DC
  Administered 2012-01-13 – 2012-01-15 (×5): 3.125 mg via ORAL
  Filled 2012-01-13 (×7): qty 1

## 2012-01-13 MED ORDER — LISINOPRIL 2.5 MG PO TABS
2.5000 mg | ORAL_TABLET | Freq: Every day | ORAL | Status: DC
  Administered 2012-01-13 – 2012-01-15 (×3): 2.5 mg via ORAL
  Filled 2012-01-13 (×3): qty 1

## 2012-01-13 MED ORDER — ALPRAZOLAM 0.25 MG PO TABS
0.2500 mg | ORAL_TABLET | Freq: Three times a day (TID) | ORAL | Status: DC | PRN
  Administered 2012-01-13 – 2012-01-14 (×4): 0.25 mg via ORAL
  Filled 2012-01-13 (×4): qty 1

## 2012-01-13 NOTE — Progress Notes (Signed)
Report from Night RN. Chart reviewed together. Handoff complete.  

## 2012-01-13 NOTE — Progress Notes (Addendum)
Patient ID: Kaitlyn Bowman, female   DOB: 1961/06/30, 51 y.o.   MRN: [id number]    SUBJECTIVE: No chest pain.  Mild dyspnea.  SBP in 110s this am.      . aspirin  243 mg Oral Once  . aspirin  81 mg Oral Daily  . atorvastatin  80 mg Oral q1800  . bivalirudin      . carvedilol  3.125 mg Oral BID WC  . DOPamine      . fentaNYL      . heparin      . heparin  16 Units/kg/hr Intravenous Once  . heparin  4,000 Units Intravenous Once  . lidocaine      . lisinopril  2.5 mg Oral Daily  . midazolam      .  morphine injection  2 mg Intravenous Once  . nitroGLYCERIN      . ondansetron      . sodium chloride  3 mL Intravenous Q12H  . Ticagrelor      . Ticagrelor  90 mg Oral BID  . DISCONTD: amLODipine  5 mg Oral Daily  . DISCONTD: aspirin EC  81 mg Oral Daily      Filed Vitals:   01/13/12 0400 01/13/12 0500 01/13/12 0600 01/13/12 0741  BP: 106/60 102/65 113/69   Pulse: 59 58 63   Temp:    97.6 F (36.4 C)  TempSrc:    Oral  Resp: 15 14 14    Height:      Weight:  183 lb 13.8 oz (83.4 kg)    SpO2: 99% 99% 96% 97%    Intake/Output Summary (Last 24 hours) at 01/13/12 0814 Last data filed at 01/13/12 0600  Gross per 24 hour  Intake 303.75 ml  Output   1000 ml  Net -696.25 ml    LABS: Basic Metabolic Panel:  Basename 01/13/12 0303 01/12/12 2200  NA 139 137  K 4.3 4.4  CL 107 104  CO2 23 22  GLUCOSE 104* 103*  BUN 10 12  CREATININE 0.68 0.62  CALCIUM 9.1 8.8  MG -- 1.9  PHOS -- --   Liver Function Tests:  Basename 01/12/12 2200  AST 39*  ALT 25  ALKPHOS 73  BILITOT 0.2*  PROT 6.9  ALBUMIN 3.6   No results found for this basename: LIPASE:2,AMYLASE:2 in the last 72 hours CBC:  Basename 01/13/12 0303 01/12/12 2200  WBC 10.5 13.8*  NEUTROABS -- 8.5*  HGB 10.7* 10.7*  HCT 32.2* 32.9*  MCV 78.5 78.3  PLT 247 259   Cardiac Enzymes:  Basename 01/13/12 0302 01/12/12 2137  CKTOTAL 325* 217*  CKMB 24.8* 8.0*  CKMBINDEX -- --  TROPONINI 6.33* 1.72*    BNP: No components found with this basename: POCBNP:3 D-Dimer: No results found for this basename: DDIMER:2 in the last 72 hours Hemoglobin A1C: No results found for this basename: HGBA1C in the last 72 hours Fasting Lipid Panel:  Basename 01/13/12 0303  CHOL 273*  HDL 36*  LDLCALC 197*  TRIG 201*  CHOLHDL 7.6  LDLDIRECT --   Thyroid Function Tests:  Basename 01/12/12 2200  TSH 6.838*  T4TOTAL --  T3FREE --  THYROIDAB --   Anemia Panel: No results found for this basename: VITAMINB12,FOLATE,FERRITIN,TIBC,IRON,RETICCTPCT in the last 72 hours  RADIOLOGY: No results found.  PHYSICAL EXAM General: NAD Neck: No JVD, no thyromegaly or thyroid nodule.  Lungs: Rare rhonchi CV: Nondisplaced PMI.  Heart regular S1/S2, no S3/S4, no murmur.  No peripheral edema.  No  carotid bruit.  Normal pedal pulses.  Abdomen: Soft, nontender, no hepatosplenomegaly, no distention.  Neurologic: Alert and oriented x 3.  Psych: Normal affect. Extremities: No clubbing or cyanosis.   TELEMETRY: Reviewed telemetry pt in NSR  ASSESSMENT AND PLAN:  51 yo with hyperlipidemia and FH of CAD had acute inferior MI now s/p DES to RCA.  EF 45% on LV-gram with inferior hypokinesis.  She quit smoking years ago and says she quit using cocaine years ago as well.  - Echocardiogram - Start low dose lisinopril (2.5 mg daily) and low dose Coreg (3.125 mg bid) as BP stable in 110s this am.  - Given prior h/o cocaine, will check UDS.   If positive will stop beta blocker.  - Mobilize.  - ASA/Brilinta/high dose statin - If doing well, may be able to go home tomorrow afternoon.   [name] 01/13/2012 8:16 AM

## 2012-01-13 NOTE — Progress Notes (Signed)
Pt reports no more pain.

## 2012-01-13 NOTE — Progress Notes (Signed)
CARDIAC REHAB PHASE I   PRE:  Rate/Rhythm: 69 SR w/ PVC  BP:  Supine: 125/60  Sitting:   Standing:    SaO2: 98% RA  MODE:  Ambulation: 350 ft   POST:  Rate/Rhythem: 75  BP:  Supine:   Sitting: 126/70  Standing:    SaO2: 98% RA  1108-1148 Pt tolerated ambulation well with assist x1, no c/o VSS. Completed MI education including restrictions, risk factor modification, CP, NTG use and calling 911, heart healthy diet and exercise guidelines given. Pt voices understanding of instructions given. Discussed Phase 2 cardiac rehab, and pt gave permission to send her contact info to Cardiac Rehab program at [facility].  [place], [place]

## 2012-01-13 NOTE — H&P (Signed)
Kaitlyn Bowman, Kaitlyn Bowman NO.:  [id number]  MEDICAL RECORD NO.:  [id number]  LOCATION:  2906                         FACILITY:  [facility]  PHYSICIAN:  Natasha Bence, MD       DATE OF BIRTH:  [birth year]  DATE OF ADMISSION:  01/12/2012 DATE OF DISCHARGE:                             HISTORY & PHYSICAL   CHIEF COMPLAINT:  Chest pain.  HISTORY OF PRESENT ILLNESS:  The patient is a 51 year old black female with no known past medical history, who presented to the outside emergency room with chest discomfort that started 1 o'clock this afternoon.  She was found to have inferior ST elevation and was transferred for emergent catheterization.  The patient reports that she began having substernal chest pressure around 1 o'clock this afternoon, also accompanied by shortness of breath and diaphoresis.  She has not had any recent angina or shortness of breath prior to this.  No palpitations, lightheadedness, or dizziness.  She has a history of crack cocaine use in the distant past, but she said she has not used anything in a years.  Of note, she had normal coronary angiograms in 2011. Otherwise, she denies any fevers, chills, or sweats.  She has not had any problems with bleeding or melena.  She denies any skin rash or ulcers.  She has no upcoming surgeries planned.  She reports she can be compliant with anti-platelet therapy.  She also denies any recent smoking history.  Currently, she is still hurting in her chest, but the pain is somewhat improved, otherwise.  REVIEW OF SYSTEMS:  A complete review of systems was performed and was negative.  PAST MEDICAL HISTORY:  None.  PAST SURGICAL HISTORY:  She had a hysterectomy.  FAMILY HISTORY:  Mother had a massive heart attack.  ALLERGIES:  She is allergic to CIPROFLOXACIN.  HOME MEDICATIONS:  None.  PHYSICAL EXAMINATION:  VITAL SIGNS:  She is afebrile, temperature 98.3, pulse is 75 and regular, blood pressure 131/80, respiratory  rate of 18, O2 sats 100%. GENERAL:  She is a well-developed, well-nourished, black female in no apparent distress. EYES:  She has anicteric sclerae. NECK:  Normal jugular venous pressure.  No carotid bruits. LUNGS:  Clear auscultation bilaterally. CARDIOVASCULAR:  She has a regular rate and rhythm.  No murmurs, rubs, or gallops. ABDOMEN:  Soft, nontender, nondistended. EXTREMITIES:  Warm with no edema, pulses symmetrical throughout. SKIN:  No rashes or ulcers. NEURO:  Grossly afocal.  LABORATORY DATA:  Sodium 137, potassium 4.4, chloride 104, bicarb 22, BUN 12, creatinine 0.62, calcium 8.8, glucose 103.  CK-MB was 8.0, CK 217.  Troponin was 1.72.  White count was 13.8, hematocrit was 33, platelet count 259.  EKG showed sinus mechanism, rate of 65 beats per minute; otherwise, she had inferior ST elevation reciprocal changes.  IMPRESSION AND PLAN:  This is a 51 year old black female with no known past medical history, who presents with acute chest pain, found have inferior ST elevation.  The patient was taken emergently to the cath lab, where she was found to have occluded right coronary artery, which was stented promptly.  She tolerated procedure well and was currently pain free.  She  will continue dual anti-platelet therapy with aspirin and Brilinta, and she also will be placed on high-dose statin.  We will check an echocardiogram in the morning as well as a lipid panel. Currently, she is pain free and hemodynamically stable.  We will monitor her in the ICU post-catheterization.          ______________________________ Natasha Bence, MD     MH/MEDQ  D:  01/12/2012  T:  01/12/2012  Job:  [id number]

## 2012-01-13 NOTE — Progress Notes (Signed)
CRITICAL VALUE ALERT  Critical value received:  Trop 1.72  CKMB 8   PTT > 200  Date of notification: 01/12/2012  Time of notification:  2257  Critical value read back: yea  Nurse who received alert:  Jerry Caras   MD notified (1st page):  Dr. Margo Aye  Time of first page:  2300  MD notified (2nd page):  Time of second page:  Responding MD:  Dr. Margo Aye  Time MD responded:  2310

## 2012-01-13 NOTE — Progress Notes (Signed)
0130  Rt Femoral sheath removed per order. Pressure held times 25 minutes. No hematoma. 2+ Rt pedal pulse. Pt tolerated well. No adverse events.  Pressure dressing applied and Pt educated to call for bleeding or pain. Bed rest til 0530 this am. Jerry Caras 01/13/2012

## 2012-01-13 NOTE — Progress Notes (Signed)
Pt c/o chest "burning" mid sternal non radiating. Rated 5/10. o2 placed 2 lpm via n/c EKG to bedside. bp 114/72 hr 58. No shob or diuresis. Skin normal in temp and consistancy.

## 2012-01-13 NOTE — Progress Notes (Addendum)
[id number] Initiated MI education with pt, MI book, heart healthy diet sheet given. Pt wanted to visit with arriving family, requested to ambulate  Later. Will return to complete education and ambulation.  Cristy Hilts, MS ACSM CES

## 2012-01-14 DIAGNOSIS — I059 Rheumatic mitral valve disease, unspecified: Secondary | ICD-10-CM

## 2012-01-14 DIAGNOSIS — Z8639 Personal history of other endocrine, nutritional and metabolic disease: Secondary | ICD-10-CM

## 2012-01-14 DIAGNOSIS — E039 Hypothyroidism, unspecified: Secondary | ICD-10-CM

## 2012-01-14 DIAGNOSIS — E785 Hyperlipidemia, unspecified: Secondary | ICD-10-CM

## 2012-01-14 DIAGNOSIS — I2119 ST elevation (STEMI) myocardial infarction involving other coronary artery of inferior wall: Secondary | ICD-10-CM

## 2012-01-14 LAB — BASIC METABOLIC PANEL
CO2: 26 mEq/L (ref 19–32)
Calcium: 9.2 mg/dL (ref 8.4–10.5)
Glucose, Bld: 96 mg/dL (ref 70–99)
Sodium: 139 mEq/L (ref 135–145)

## 2012-01-14 LAB — CBC
HCT: 36.3 % (ref 36.0–46.0)
Hemoglobin: 11.9 g/dL — ABNORMAL LOW (ref 12.0–15.0)
MCH: 26.4 pg (ref 26.0–34.0)
MCV: 80.7 fL (ref 78.0–100.0)
Platelets: 266 10*3/uL (ref 150–400)
RBC: 4.5 MIL/uL (ref 3.87–5.11)

## 2012-01-14 NOTE — Progress Notes (Signed)
Patient ID: Kaitlyn Bowman, female   DOB: 12/23/1960, 51 y.o.   MRN: [id number]     SUBJECTIVE: Chest pain yesterday afternoon, none since that time.  SBP in the 110s this am.     . aspirin  81 mg Oral Daily  . atorvastatin  80 mg Oral q1800  . carvedilol  3.125 mg Oral BID WC  . lisinopril  2.5 mg Oral Daily  . sodium chloride  3 mL Intravenous Q12H  . Ticagrelor  90 mg Oral BID      Filed Vitals:   01/13/12 1540 01/13/12 2000 01/13/12 2330 01/14/12 0325  BP:  99/54  95/72  Pulse:      Temp: 97.4 F (36.3 C) 97.8 F (36.6 C) 98.1 F (36.7 C) 98 F (36.7 C)  TempSrc: Oral Oral Oral Oral  Resp: 18 16 16 16   Height:      Weight:      SpO2: 99% 98% 94% 94%    Intake/Output Summary (Last 24 hours) at 01/14/12 0747 Last data filed at 01/13/12 2000  Gross per 24 hour  Intake    120 ml  Output    200 ml  Net    -80 ml    LABS: Basic Metabolic Panel:  Basename 01/14/12 0520 01/13/12 0303 01/12/12 2200  NA 139 139 --  K 4.0 4.3 --  CL 105 107 --  CO2 26 23 --  GLUCOSE 96 104* --  BUN 13 10 --  CREATININE 0.78 0.68 --  CALCIUM 9.2 9.1 --  MG -- -- 1.9  PHOS -- -- --   Liver Function Tests:  Basename 01/12/12 2200  AST 39*  ALT 25  ALKPHOS 73  BILITOT 0.2*  PROT 6.9  ALBUMIN 3.6   No results found for this basename: LIPASE:2,AMYLASE:2 in the last 72 hours CBC:  Basename 01/14/12 0520 01/13/12 0303 01/12/12 2200  WBC 7.5 10.5 --  NEUTROABS -- -- 8.5*  HGB 11.9* 10.7* --  HCT 36.3 32.2* --  MCV 80.7 78.5 --  PLT 266 247 --   Cardiac Enzymes:  Basename 01/13/12 1007 01/13/12 0302 01/12/12 2137  CKTOTAL 324* 325* 217*  CKMB 26.3* 24.8* 8.0*  CKMBINDEX -- -- --  TROPONINI 4.85* 6.33* 1.72*   BNP: No components found with this basename: POCBNP:3 D-Dimer: No results found for this basename: DDIMER:2 in the last 72 hours Hemoglobin A1C: No results found for this basename: HGBA1C in the last 72 hours Fasting Lipid Panel:  Basename 01/13/12 0303    CHOL 273*  HDL 36*  LDLCALC 197*  TRIG 201*  CHOLHDL 7.6  LDLDIRECT --   Thyroid Function Tests:  Basename 01/12/12 2200  TSH 6.838*  T4TOTAL --  T3FREE --  THYROIDAB --   Anemia Panel: No results found for this basename: VITAMINB12,FOLATE,FERRITIN,TIBC,IRON,RETICCTPCT in the last 72 hours  RADIOLOGY: No results found.  PHYSICAL EXAM General: NAD Neck: No JVD, no thyromegaly or thyroid nodule.  Lungs: Rare rhonchi CV: Nondisplaced PMI.  Heart regular S1/S2, no S3/S4, no murmur.  No peripheral edema.  No carotid bruit.  Normal pedal pulses.  Abdomen: Soft, nontender, no hepatosplenomegaly, no distention.  Neurologic: Alert and oriented x 3.  Psych: Normal affect. Extremities: No clubbing or cyanosis.   TELEMETRY: Reviewed telemetry pt in NSR  ASSESSMENT AND PLAN:  51 yo with hyperlipidemia and FH of CAD had acute inferior MI now s/p DES to RCA.  EF 45% on LV-gram with inferior hypokinesis.  She quit smoking years ago and  says she quit using cocaine years ago as well.  - Awaiting echocardiogram - Continue low dose lisinopril (2.5 mg daily) and low dose Coreg (3.125 mg bid) as BP stable in 110s this am.  - UDS negative for cocaine. - Cardiac rehab - ASA/Brilinta/high dose statin - Plan discharge Monday.  Transfer to telemetry now.   Kaitlyn Bowman 01/14/2012 7:47 AM

## 2012-01-14 NOTE — Progress Notes (Signed)
  Echocardiogram 2D Echocardiogram has been performed.  Kaitlyn Bowman, Real Cons 01/14/2012, 5:25 PM

## 2012-01-14 NOTE — Progress Notes (Signed)
Patient Name: Kaitlyn Bowman      SUBJECTIVE:admitted with chest pain>>IMI RCA stent  ejection fraction 45% with inferior wall hypokinesis; remote history of cocaine use but UDS was negative. Plan was discharge on Monday. Currently on beta blockers and ACE inhibitors  Past Medical History  Diagnosis Date  . Coronary artery disease   . Angina   . Shortness of breath   . GERD (gastroesophageal reflux disease)   . Headache     PHYSICAL EXAM Filed Vitals:   01/13/12 2000 01/13/12 2330 01/14/12 0325 01/14/12 0823  BP: 99/54  95/72   Pulse:      Temp: 97.8 F (36.6 C) 98.1 F (36.7 C) 98 F (36.7 C) 97.9 F (36.6 C)  TempSrc: Oral Oral Oral Oral  Resp: 16 16 16 20   Height:      Weight:      SpO2: 98% 94% 94% 98%   Well developed and nourished in no acute distress HENT normal Neck supple with JVP-flat Clear Regular rate and rhythm, no murmurs or gallops Abd-soft with active BS No Clubbing cyanosis edema Skin-warm and dry A & Oriented  Grossly normal sensory and motor function  TELEMETRY: Reviewed telemetry pt in NSR:    Intake/Output Summary (Last 24 hours) at 01/14/12 0955 Last data filed at 01/13/12 2000  Gross per 24 hour  Intake    120 ml  Output    200 ml  Net    -80 ml    LABS: Basic Metabolic Panel:  Lab 01/14/12 1610 01/13/12 0303 01/12/12 2200  NA 139 139 137  K 4.0 4.3 4.4  CL 105 107 104  CO2 26 23 22   GLUCOSE 96 104* 103*  BUN 13 10 12   CREATININE 0.78 0.68 0.62  CALCIUM 9.2 9.1 --  MG -- -- 1.9  PHOS -- -- --   Cardiac Enzymes:  Basename 01/13/12 1007 01/13/12 0302 01/12/12 2137  CKTOTAL 324* 325* 217*  CKMB 26.3* 24.8* 8.0*  CKMBINDEX -- -- --  TROPONINI 4.85* 6.33* 1.72*   CBC:  Lab 01/14/12 0520 01/13/12 0303 01/12/12 2200  WBC 7.5 10.5 13.8*  NEUTROABS -- -- 8.5*  HGB 11.9* 10.7* 10.7*  HCT 36.3 32.2* 32.9*  MCV 80.7 78.5 78.3  PLT 266 247 259   PROTIME:  Basename 01/12/12 2200  LABPROT 23.9*  INR 2.10*   Liver  Function Tests:  Basename 01/12/12 2200  AST 39*  ALT 25  ALKPHOS 73  BILITOT 0.2*  PROT 6.9  ALBUMIN 3.6     Basename 01/13/12 0303  CHOL 273*  HDL 36*  LDLCALC 197*  TRIG 201*  CHOLHDL 7.6  LDLDIRECT --   Thyroid Function Tests:  Basename 01/12/12 2200  TSH 6.838*  T4TOTAL --  T3FREE --  THYROIDAB --   Anemia Panel: No results found for this basename: VITAMINB12,FOLATE,FERRITIN,TIBC,IRON,RETICCTPCT in the last 72 hours      ASSESSMENT AND PLAN:  Patient Active Hospital Problem List: Inferior MI-status post stenting 4/13 (01/14/2012)   Hyperlipidemia (01/14/2012)   Hypothyroidism (01/14/2012)  a patient is status post acute inferior wall MI. Blood pressures are borderline on low-dose ACE inhibitors and beta blockers. She is also on Brelinta  Her TSH is elevated free T3 and free T4 are pending. Anticipated discharge is tomorrow  Financial issues are concerned. We will the case manager to help her with this. We have discussed [employer] program And also the need to stop eating a [employer]  Signed, Sherryl Manges MD  01/14/2012

## 2012-01-15 ENCOUNTER — Encounter (HOSPITAL_COMMUNITY): Payer: Self-pay | Admitting: Physician Assistant

## 2012-01-15 LAB — CBC
HCT: 39.5 % (ref 36.0–46.0)
MCHC: 32.7 g/dL (ref 30.0–36.0)
RDW: 15 % (ref 11.5–15.5)

## 2012-01-15 LAB — T3, FREE: T3, Free: 3 pg/mL (ref 2.3–4.2)

## 2012-01-15 LAB — POCT ACTIVATED CLOTTING TIME: Activated Clotting Time: 435 s

## 2012-01-15 MED ORDER — CARVEDILOL 3.125 MG PO TABS: 3.1250 mg | ORAL_TABLET | Freq: Two times a day (BID) | ORAL | Status: DC

## 2012-01-15 MED ORDER — NITROGLYCERIN 0.4 MG SL SUBL: 0.4000 mg | SUBLINGUAL_TABLET | SUBLINGUAL | Status: DC | PRN

## 2012-01-15 MED ORDER — ASPIRIN 81 MG PO TABS: 81.0000 mg | ORAL_TABLET | Freq: Every day | ORAL | Status: AC

## 2012-01-15 MED ORDER — TICAGRELOR 90 MG PO TABS: 90.0000 mg | ORAL_TABLET | Freq: Two times a day (BID) | ORAL | Status: DC

## 2012-01-15 MED ORDER — LISINOPRIL 2.5 MG PO TABS: 2.5000 mg | ORAL_TABLET | Freq: Every day | ORAL | Status: DC

## 2012-01-15 MED ORDER — ATORVASTATIN CALCIUM 80 MG PO TABS: 80.0000 mg | ORAL_TABLET | Freq: Every day | ORAL | Status: DC

## 2012-01-15 NOTE — Discharge Summary (Signed)
Discharge Summary   Patient ID: Kaitlyn Bowman MRN: [id number], DOB/AGE: 52/01/1961 52 y.o. Admit date: 01/12/2012 D/C date:     01/15/2012   Primary Discharge Diagnoses:  1. Newly diagnosed CAD  - with inferior STEMI this admission s/p PTCA/DES to mid RCA 01/13/12 - initial LV dysfunction with EF 45% by cath, 55-60% by echo the next day 2. Dyslipidemia (trig 201, HDL 36, LDL 197)  - will need f/u LFTs/lipids in 6 weeks 3. Abnormal TSH - instructed to f/u with PCP 4. History of remote cocaine abuse  Hospital Course: 51 y/o F with very remote hx of crack cocaine use and reported normal coronary angiogram in 2011 who presented to [facility] 01/13/12 overnight with complaints of substernal chest pressure starting around 1pm earlier that day with associated SOB and diaphoresis. EKG demonstrated sinus rhythm with inferior ST elevation and reciprocal changes and she was taken for emergent cath demonstrating occluded mid RCA which successfully had PTCA/DES to this vessel. LVEF was 45% with inferior hypokinesis by cath, later estimated at 55-60% by echo the following day. She was started on ASA, Brilinta, statin, & low-dose BB as blood pressure tolerated. UDS was negative for cocaine (+ for benzo, opiates but she had received while in hospital). TSH was mildly elevated. Free T3 is pending at time of discharge but patient will be regardless instructed to follow-up with PCP for recheck as abnormal TFTs are seen in acute stress. Today she is feeling well and has ambulated. The patient was seen and examined today and felt stable for discharge by Dr. Clifton James. He feels she can return to work in 1 week; the patient requests light duty thereafter which is reasonable until her followup appointment in the office.   Discharge Vitals: Blood pressure 109/76, pulse 73, temperature 98.1 F (36.7 C), temperature source Oral, resp. rate 18, height 5\' 4"  (1.626 m), weight 187 lb 6.3 oz (85 kg), SpO2 95.00%.  Labs: Lab  Results  Component Value Date   WBC 7.0 01/15/2012   HGB 12.9 01/15/2012   HCT 39.5 01/15/2012   MCV 80.1 01/15/2012   PLT 313 01/15/2012    Lab 01/14/12 0520 01/12/12 2200  NA 139 --  K 4.0 --  CL 105 --  CO2 26 --  BUN 13 --  CREATININE 0.78 --  CALCIUM 9.2 --  PROT -- 6.9  BILITOT -- 0.2*  ALKPHOS -- 73  ALT -- 25  AST -- 39*  GLUCOSE 96 --    Basename 01/13/12 1007 01/13/12 0302 01/12/12 2137  CKTOTAL 324* 325* 217*  CKMB 26.3* 24.8* 8.0*  TROPONINI 4.85* 6.33* 1.72*   Lab Results  Component Value Date   CHOL 273* 01/13/2012   HDL 36* 01/13/2012   LDLCALC 197* 01/13/2012   TRIG 201* 01/13/2012  Baseline coags were abnormal felt secondary to anticoag medicine used during catheterization. She had normal Hgb at time of discharge and no s/sx of bleeding or liver dysfunction.  Diagnostic Studies/Procedures   1. 2D echo Study Conclusions 01/14/12 - Left ventricle: The cavity size was normal. Systolic function was normal. The estimated ejection fraction was in the range of 55% to 60%. Wall motion was normal; there were no regional wall motion abnormalities. - Mitral valve: Mild regurgitation. - Left atrium: The atrium was mildly dilated. - Atrial septum: No defect or patent foramen ovale was identified.  2. Cardiac catheterization this admission, please see full report and above for summary.  Discharge Medications   Medication List  As of 01/15/2012 11:05 AM   STOP taking these medications         pseudoephedrine-guaifenesin 60-600 MG per tablet         TAKE these medications         aspirin 81 MG tablet   Take 1 tablet (81 mg total) by mouth daily.      atorvastatin 80 MG tablet   Commonly known as: LIPITOR   Take 1 tablet (80 mg total) by mouth at bedtime.      carvedilol 3.125 MG tablet   Commonly known as: COREG   Take 1 tablet (3.125 mg total) by mouth 2 (two) times daily with a meal.      lisinopril 2.5 MG tablet   Commonly known as: PRINIVIL,ZESTRIL   Take 1  tablet (2.5 mg total) by mouth daily.      nitroGLYCERIN 0.4 MG SL tablet   Commonly known as: NITROSTAT   Place 1 tablet (0.4 mg total) under the tongue every 5 (five) minutes x 3 doses as needed for chest pain.      Ticagrelor 90 MG Tabs tablet   Commonly known as: BRILINTA   Take 1 tablet (90 mg total) by mouth 2 (two) times daily.            Disposition   The patient will be discharged in stable condition to home. Discharge Orders    Future Appointments: Provider: Department: Dept Phone: Center:   01/30/2012 2:00 PM Kathleene Hazel, MD [facility] [phone] [id number]     Future Orders Please Complete By Expires   Diet - low sodium heart healthy      Increase activity slowly      Comments:   No driving for 2 weeks. No heavy lifting for 4 weeks. No sexual activity for 4 weeks. You may return to work on 01/23/12 light duty - you will see Dr. Clifton James 4/23 at which time you may be released back to full duty. Keep procedure site clean & dry. If you notice increased pain, swelling, bleeding or pus, call/return!  You may shower, but no soaking baths/hot tubs/pools for 1 week.       Follow-up Information    Follow up with [facility], [place]. (01/30/12 at 2pm)    Contact information:   [facility] [address] [place] [address] [phone]       Follow up with Primary Care Doctor. (Your thyroid function labwork was mildly abnormal but this is common to see in situations of acute stress on the body. Please see primary care doctor for repeat thyroid hormone testing.)            Duration of Discharge Encounter: Greater than 30 minutes including physician and PA time.  Signed, Peg Fifer PA-C 01/15/2012, 11:05 AM

## 2012-01-15 NOTE — Discharge Instructions (Signed)
Myocardial Infarction A myocardial infarction (MI) is damage to the heart that is not reversible. It is also called a heart attack. An MI usually occurs when a heart (coronary) artery becomes blocked or narrowed. This cuts off the blood supply to the heart. When one or more of the heart (coronary) arteries becomes blocked, that area of the heart begins to die. This causes pain felt during an MI.  If you think you might be having an MI, call your local emergency services immediately (911 in [place] It is recommended that you take a 162 mg non-enteric coated aspirin if you do not have an aspirin allergy. Do not drive yourself to the hospital or wait to see if your symptoms go away. The sooner MI is treated, the greater the amount of heart muscle saved. Time is muscle. It can save your life. CAUSES  An MI can occur from:  A gradual buildup of a fatty substance called plaque. When plaque builds up in the arteries, this condition is called atherosclerosis. This buildup can block or reduce the blood supply to the heart artery(s).   A sudden plaque rupture within a heart artery that causes a blood clot (thrombus). A blood clot can block the heart artery which does not allow blood flow to the heart.   A severe tightening (spasm) of the heart artery. This is a less common cause of a heart attack. When a heart artery spasms, it cuts off blood flow through the artery. Spasms can occur in heart arteries that do not have atherosclerosis.  RISK FACTORS People at risk for an MI usually have one or more risk factors, such as:  High blood pressure.   High cholesterol.   Smoking.   Gender. Men have a higher heart attack risk.   Overweight/obesity.   Age.   Family history.   Lack of exercise.   Diabetes.   Stress.   Excessive alcohol use.   Street drug use (cocaine and methamphetamines).  SYMPTOMS  MI symptoms can vary, such as:  In both men and women, MI symptoms can include the following:    Chest pain. The chest pain may feel like a crushing, squeezing, or "pressure" type feeling. MI pain can be "referred," meaning pain can be caused in one part of the body but felt in another part of the body. Referred MI pain may occur in the left arm, neck, or jaw. Pain may even be felt in the right arm.   Shortness of breath (dyspnea).   Heartburn or indigestion with or without vomiting, shortness of breath, or sweating (diaphoresis).   Sudden, cold sweats.   Sudden lightheadedness.   Upper back pain.   Women can have unique MI symptoms, such as:   Unexplained feelings of nervousness or anxiety.   Discomfort between the shoulder blades (scapula) or upper back.   Tingling in the hands and arms.   In elderly people (regardless of gender), MI symptoms can be subtle, such as:   Sweating (diaphoresis).   Shortness of breath (dyspnea).   General tiredness (fatigue) or not feeling well (malaise).  DIAGNOSIS  Diagnosis of an MI involves several tests such as:  An assessment of your vital signs such as heart rhythm, blood pressure, respiratory rate, and oxygen level.   An EKG (ECG) to look at the electrical activity of your heart.   Blood tests called cardiac markers are drawn at scheduled times to measure proteins or enzymes released by the damaged heart muscle.   A chest   X-ray.   An echocardiogram to evaluate heart motion and blood flow.   Coronary angiography (cardiac catheterization). This is a diagnostic procedure to look at the heart arteries.  TREATMENT  Acute Intervention. For an MI, the national standard in the [place] is to have an acute intervention in under 90 minutes from the time you get to the hospital. An acute intervention is a special procedure to open up the heart arteries. It is done in a treatment room called a "catheterization lab" (cath lab). Some hospitals do no have a cath lab. If you are having an MI and the hospital does not have a cath lab, the  standard is to transport you to a hospital that has one. In the cath lab, acute intervention includes:  Angioplasty. An angioplasty involves inserting a thin, flexible tube (catheter) into an artery in either your groin or wrist. The catheter is threaded to the heart arteries. A balloon at the end of the catheter is inflated to open a narrowed or blocked heart artery. During an angioplasty procedure, a small mesh tube (stent) may be used to keep the heart artery open. Depending on your condition and health history, one of two types of stents may be placed:   Drug-eluting stent (DES). A DES is coated with a medicine to prevent scar tissue from growing over the stent. With drug-eluting stents, blood thinning medicine will need to be taken for up to a year.   Bare metal stent. This type of stent has no special coating to keep tissue from growing over it. This type of stent is used if you cannot take blood thinning medicine for a prolonged time or you need surgery in the near future. After a bare metal stent is placed, blood thinning medicine will need to be taken for about a month.   If you are taking blood thinning medicine (anti-platelet therapy) after stent placement, do not stop taking it unless your caregiver says it is okay to do so. Make sure you understand how long you need to take the medicine.  Surgical Intervention  If an acute intervention is not successful, surgery may be needed:   Open heart surgery (coronary artery bypass graft, CABG). CABG takes a vein (saphenous vein) from your leg. The vein is then attached to the blocked heart artery which bypasses the blockage. This then allows blood flow to the heart muscle.  Additional Interventions  A "clot buster" medicine (thrombolytic) may be given. This medicine can help break up a clot in the heart artery. This medicine may be given if a person cannot get to a cath lab right away.   Intra-aortic balloon pump (IABP). If you have suffered a  very severe MI and are too unstable to go to the cath lab or to surgery, an IABP may be used. This is a temporary mechanical device used to increase blood flow to the heart and reduce the workload of the heart until you are stable enough to go to the cath lab or surgery.  HOME CARE INSTRUCTIONS After an MI, you may need the following:  Medication. Take medication as directed by your caregiver. Medications after an MI may:   Keep your blood from clotting easily (blood thinners).   Control your blood pressure.   Help lower your cholesterol.   Control abnormal heart rhythms.   Lifestyle changes. Under the guidance of your caregiver, lifestyle changes include:   Quitting smoking, if you smoke. Your caregiver can help you quit.   Being   physically active.   Maintaining a healthy weight.   Eating a heart healthy diet. A dietician can help you learn healthy eating options.   Managing diabetes.   Reducing stress.   Limiting alcohol intake.  SEEK IMMEDIATE MEDICAL CARE IF:   You have severe chest pain, especially if the pain is crushing or pressure-like and spreads to the arms, back, neck, or jaw. This is an emergency. Do not wait to see if the pain will go away. Get medical help at once. Call your local emergency services (911 in the [place] Do not drive yourself to the hospital.   You have shortness of breath during rest, sleep, or with activity.   You have sudden sweating or clammy skin.   You feel sick to your stomach (nauseous) and throw up (vomit).   You suddenly become lightheaded or dizzy.   You feel your heart beating rapidly or you notice "skipped" beats.  MAKE SURE YOU:   Understand these instructions.   Will watch your condition.   Will get help right away if you are not doing well or get worse.  Document Released: 09/25/2005 Document Revised: 09/14/2011 Document Reviewed: 02/22/2011 Exit[employer] Patient Information 2012 [employer]. 

## 2012-01-15 NOTE — Progress Notes (Signed)
UR Completed. Simmons, Marquelle Balow F [phone]  

## 2012-01-15 NOTE — Progress Notes (Signed)
    SUBJECTIVE: No chest pain or SOB.   BP 106/69  Pulse 73  Temp(Src) 98.1 F (36.7 C) (Oral)  Resp 18  Ht 5\' 4"  (1.626 m)  Wt 187 lb 6.3 oz (85 kg)  BMI 32.17 kg/m2  SpO2 95%  Intake/Output Summary (Last 24 hours) at 01/15/12 0802 Last data filed at 01/14/12 1900  Gross per 24 hour  Intake    480 ml  Output      0 ml  Net    480 ml    PHYSICAL EXAM General: Well developed, well nourished, in no acute distress. Alert and oriented x 3.  Psych:  Good affect, responds appropriately Neck: No JVD. No masses noted.  Lungs: Clear bilaterally with no wheezes or rhonci noted.  Heart: RRR with no murmurs noted. Abdomen: Bowel sounds are present. Soft, non-tender.  Extremities: No lower extremity edema.   LABS: Basic Metabolic Panel:  Basename 01/14/12 0520 01/13/12 0303 01/12/12 2200  NA 139 139 --  K 4.0 4.3 --  CL 105 107 --  CO2 26 23 --  GLUCOSE 96 104* --  BUN 13 10 --  CREATININE 0.78 0.68 --  CALCIUM 9.2 9.1 --  MG -- -- 1.9  PHOS -- -- --   CBC:  Basename 01/15/12 0645 01/14/12 0520 01/12/12 2200  WBC 7.0 7.5 --  NEUTROABS -- -- 8.5*  HGB 12.9 11.9* --  HCT 39.5 36.3 --  MCV 80.1 80.7 --  PLT 313 266 --   Cardiac Enzymes:  Basename 01/13/12 1007 01/13/12 0302 01/12/12 2137  CKTOTAL 324* 325* 217*  CKMB 26.3* 24.8* 8.0*  CKMBINDEX -- -- --  TROPONINI 4.85* 6.33* 1.72*   Fasting Lipid Panel:  Basename 01/13/12 0303  CHOL 273*  HDL 36*  LDLCALC 197*  TRIG 201*  CHOLHDL 7.6  LDLDIRECT --    Current Meds:    . aspirin  81 mg Oral Daily  . atorvastatin  80 mg Oral q1800  . carvedilol  3.125 mg Oral BID WC  . lisinopril  2.5 mg Oral Daily  . sodium chloride  3 mL Intravenous Q12H  . Ticagrelor  90 mg Oral BID     ASSESSMENT AND PLAN:  1. Inferior STEMI: s/p DES x 1 RCA. Doing well. She is on ASA and Brilinta and will need dual antiplatelet therapy for one year.  Continue high dose statin. Continue low dose beta blocker and  Ace-inhibitor.   2. Dispo: D/C home today. She can f/u with me in 2-3 weeks. She will need paperwork for assistance with Brilinta and free 30 day card.   Jazzalyn Loewenstein  4/8/20138:02 AM

## 2012-01-15 NOTE — Progress Notes (Signed)
   CARE MANAGEMENT NOTE 01/15/2012  Patient:  Kaitlyn Bowman, Kaitlyn Bowman   Account Number:  [id number]  Date Initiated:  01/15/2012  Documentation initiated by:  Johny Shock  Subjective/Objective Assessment:   Request for assistance with Brilinta     Action/Plan:   Met with pt and gave card for 30 day free and 11 refills with savings. search for [employer] with Brilinta. Located this at [facility] on [address]. Pt familiar with location.   Anticipated DC Date:  01/15/2012   Anticipated DC Plan:  HOME/SELF CARE         Choice offered to / List presented to:             Status of service:  Completed, signed off Medicare Important Message given?   (If response is "NO", the following Medicare IM given date fields will be blank) Date Medicare IM given:   Date Additional Medicare IM given:    Discharge Disposition:  HOME/SELF CARE  Per UR Regulation:    If discussed at Long Length of Stay Meetings, dates discussed:    Comments:

## 2012-01-16 NOTE — Discharge Summary (Signed)
See full note.cdm 

## 2012-01-18 ENCOUNTER — Encounter: Payer: Self-pay | Admitting: *Deleted

## 2012-01-18 ENCOUNTER — Telehealth: Payer: Self-pay | Admitting: Cardiovascular Disease

## 2012-01-18 NOTE — Telephone Encounter (Signed)
That will be ok. Thanks, chris 

## 2012-01-18 NOTE — Telephone Encounter (Signed)
Spoke with pt and she had planned to return to work on January 23, 2012 but now feels she needs to wait until seen by Dr. Clifton James on January 30, 2012 and would like note sent to her employer stating she can return to work on January 31, 2012.  Note should be faxed to [phone]. Attention:Liz Sherral Hammers.  I told pt I would review with Dr. Clifton James and if OK with him I would fax note.

## 2012-01-18 NOTE — Telephone Encounter (Signed)
Please return call to patient at hm# [phone] regarding medication side affects  Patient had stent last week, medication seems to be giving patient a hard time.  C/o diarrhea and upset stomach, unable to sleep.  Please return call to patient to advise.

## 2012-01-18 NOTE — Telephone Encounter (Signed)
Spoke with pt. She states she had upset stomach last night and took Brilinta, Coreg and Lipitor at bedtime. Shortly after this she had diarrhea. Diarrhea continued a couple times during night.This is the first time this happened. Has been tolerating medications up to this point.  Today she is feeling better. No diarrhea and upset stomach improved. States she has not been able to sleep well since being in hospital. I asked pt to continue medications since symptoms have improved and to call us if upset stomach/diarrhea reoccurs.She is agreeable with this plan.   She is also concerned about lipitor and possible side effects and will discuss these with Dr. Clifton James at upcoming appt.

## 2012-01-18 NOTE — Telephone Encounter (Signed)
Letter written and faxed.

## 2012-01-18 NOTE — Telephone Encounter (Signed)
New problem:  Patient calling need 2 weeks out of work .  pls call back to discuss.

## 2012-01-24 ENCOUNTER — Encounter: Payer: Self-pay | Admitting: Family

## 2012-01-24 ENCOUNTER — Ambulatory Visit (INDEPENDENT_AMBULATORY_CARE_PROVIDER_SITE_OTHER): Payer: BC Managed Care – PPO | Admitting: Family

## 2012-01-24 VITALS — BP 100/74 | HR 74 | Temp 97.9°F | Resp 16 | Ht 67.5 in | Wt 177.0 lb

## 2012-01-24 DIAGNOSIS — E039 Hypothyroidism, unspecified: Secondary | ICD-10-CM

## 2012-01-24 DIAGNOSIS — I2119 ST elevation (STEMI) myocardial infarction involving other coronary artery of inferior wall: Secondary | ICD-10-CM

## 2012-01-24 DIAGNOSIS — J302 Other seasonal allergic rhinitis: Secondary | ICD-10-CM

## 2012-01-24 DIAGNOSIS — F1910 Other psychoactive substance abuse, uncomplicated: Secondary | ICD-10-CM

## 2012-01-24 DIAGNOSIS — J309 Allergic rhinitis, unspecified: Secondary | ICD-10-CM

## 2012-01-24 NOTE — Assessment & Plan Note (Signed)
Recommended that she try claritin (not claritin D) once daily as needed.  She has a mildly enlarged cervical lymph node which is tender and likely contributing to her neck pain.  Recommended tylenol for this as needed and to call us if symptoms worsen.

## 2012-01-24 NOTE — Progress Notes (Signed)
Subjective:    Patient ID: Kaitlyn Bowman, female    DOB: 16-Jun-1961, 51 y.o.   MRN: [id number]  HPI  Ms.  Dinger is a 51 yr old female who presents today to establish care.  She was admitted earlier this month for acute inferior MI.  She underwent cardiac catheterization with placement of DES to RCA. EF 45% on LV-gram with inferior hypokinesis.  Hospitalized x 3 days. She has follow up apt with cardiology on 4/23 (Dr. Sanjuana Kava)  She has not had a primary care physician for many years. Denies further chest pain or shortness of breath.  She is maintained on brilinta.   She reports some neck pain, swelling. Hurts to turn her head, especially to the left.  She notes feeling a little off balance.  Has been sneezing.   She reports low back pain.   Hypothyroid- reports they told her thyroid was underactive.  She reports that her weight has fluctuated for the last 1 year.  Appetite is fair.    Substance abuse- "clean for 8 yrs." Hx of crack and alcohol.  Went to rehab.    Hyperlipidemia- on statin now.    Review of Systems  Constitutional: Positive for fatigue.  HENT: Positive for neck stiffness.   Eyes: Negative for visual disturbance.  Respiratory: Negative for shortness of breath.   Cardiovascular: Negative for leg swelling.  Gastrointestinal:       Occasional nausea. No vomitting  Genitourinary: Negative for dysuria.  Musculoskeletal: Positive for back pain.  Skin: Negative for rash.  Neurological: Negative for headaches.  Hematological: Does not bruise/bleed easily.  Psychiatric/Behavioral:       Denies depression/anxiety   Past Medical History  Diagnosis Date  . Coronary artery disease      inferior STEMI 01/13/12 s/p PTCA/DES to mid RCA. Initial EF 45% by cath but improved to 55-60% by echo 01/14/12  . GERD (gastroesophageal reflux disease)   . Cocaine abuse     Remote crack cocaine use. UDS negative for cocaine 01/2012  . Dyslipidemia     Trig 201, HDL 36, LDL 197 01/2012  .  Abnormal TSH   . History of MI (myocardial infarction) 01/2012    History   Social History  . Marital Status: Single    Spouse Name: N/A    Number of Children: 1  . Years of Education: N/A   Occupational History  . 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 Smoking status: Former Smoker -- 33 years    Types: Cigarettes    Quit date: 06/13/2008  . Smokeless tobacco: Not on file   Comment: None since 2009   . Alcohol Use: No     former  . Drug Use: No     Crack cocaine; none since Jan 2006   . Sexually Active: Yes    Birth Control/ Protection: Surgical   Other Topics Concern  . Not on file   Social History Narrative   Regular exercise:  Walks 5-7 x weeklyCaffeine use:  1 dailyManager on duty at [employer] 6 GSOSingleDaughter age 62 and 52 yr old grandson- daughter lives in [place].Enjoys reading, sewing, Fill in puzzles, walkingCompleted GED.No pets.    Past Surgical History  Procedure Date  . Partial hysterectomy   . Cardiac catheterization 2010, 2013    Negative. 2013--100% blockage  . Abdominal hysterectomy     partial    Family History  Problem Relation Age of Onset  . Heart attack Mother 35  prior pacemaker  . Heart disease Mother   . Diabetes Mother   . Heart failure Father   . Heart disease Father     Allergies  Allergen Reactions  . Ciprofloxacin     REACTION: nause-sweating-chest tightness    Current Outpatient Prescriptions on File Prior to Visit  Medication Sig Dispense Refill  . aspirin 81 MG tablet Take 1 tablet (81 mg total) by mouth daily.      Marland Kitchen atorvastatin (LIPITOR) 80 MG tablet Take 1 tablet (80 mg total) by mouth at bedtime.  30 tablet  6  . carvedilol (COREG) 3.125 MG tablet Take 1 tablet (3.125 mg total) by mouth 2 (two) times daily with a meal.  60 tablet  6  . lisinopril (PRINIVIL,ZESTRIL) 2.5 MG tablet Take 1 tablet (2.5 mg total) by mouth daily.  30 tablet  6  . nitroGLYCERIN (NITROSTAT) 0.4 MG SL tablet Place 1 tablet (0.4 mg  total) under the tongue every 5 (five) minutes x 3 doses as needed for chest pain.  25 tablet  4  . Ticagrelor (BRILINTA) 90 MG TABS tablet Take 1 tablet (90 mg total) by mouth 2 (two) times daily.  60 tablet  6    BP 100/74  Pulse 74  Temp(Src) 97.9 F (36.6 C) (Oral)  Resp 16  Ht 5' 7.5" (1.715 m)  Wt 177 lb (80.287 kg)  BMI 27.31 kg/m2  SpO2 98%        Objective:   Physical Exam  Constitutional: She appears well-developed and well-nourished. No distress.  HENT:  Head: Normocephalic and atraumatic.  Right Ear: Tympanic membrane and ear canal normal.  Left Ear: Tympanic membrane and ear canal normal.  Mouth/Throat: No posterior oropharyngeal edema or posterior oropharyngeal erythema.  Neck:       Tender cervical lymph node on the left beneath chin.   Cardiovascular: Normal rate and regular rhythm.   No murmur heard. Pulmonary/Chest: Effort normal and breath sounds normal. No respiratory distress. She has no wheezes. She has no rales. She exhibits no tenderness.  Musculoskeletal: She exhibits no edema.  Skin: Skin is warm and dry. No erythema.  Psychiatric: She has a normal mood and affect. Her behavior is normal. Judgment and thought content normal.          Assessment & Plan:

## 2012-01-24 NOTE — Patient Instructions (Signed)
Please complete your lab work prior to leaving.  Follow up in 1 month for a complete physical. Welcome to [place]! 

## 2012-01-24 NOTE — Assessment & Plan Note (Signed)
TSH 6.8.  Obtain free T3/T4.  Will plan to start synthroid after review of these results.

## 2012-01-24 NOTE — Assessment & Plan Note (Signed)
She reports that she has been clean for 8 yrs.

## 2012-01-24 NOTE — Assessment & Plan Note (Signed)
Clinically stable.  Defer management to cardiology. 

## 2012-01-26 ENCOUNTER — Telehealth: Payer: Self-pay | Admitting: Family

## 2012-01-26 MED ORDER — LEVOTHYROXINE SODIUM 25 MCG PO TABS: 25.0000 ug | ORAL_TABLET | Freq: Every day | ORAL | Status: DC

## 2012-01-26 NOTE — Telephone Encounter (Signed)
Pls call pt and let her know that I have reviewed her labs and would like for her to start synthroid.  Keep follow up in 1 month.

## 2012-01-29 NOTE — Telephone Encounter (Signed)
Notified pt. 

## 2012-01-30 ENCOUNTER — Encounter: Payer: Self-pay | Admitting: *Deleted

## 2012-01-30 ENCOUNTER — Ambulatory Visit (INDEPENDENT_AMBULATORY_CARE_PROVIDER_SITE_OTHER): Payer: BC Managed Care – PPO | Admitting: Cardiovascular Disease

## 2012-01-30 ENCOUNTER — Encounter: Payer: Self-pay | Admitting: Cardiovascular Disease

## 2012-01-30 VITALS — BP 117/77 | HR 64 | Ht 66.0 in | Wt 177.0 lb

## 2012-01-30 DIAGNOSIS — I2510 Atherosclerotic heart disease of native coronary artery without angina pectoris: Secondary | ICD-10-CM | POA: Insufficient documentation

## 2012-01-30 NOTE — Assessment & Plan Note (Addendum)
Stable post inferior MI. She is tolerating all medications well. Continue current medications. She will need dual antiplatelet therapy with ASA and Brilinta for at least 6 months then may switch to ASA and Plavix. Continue beta blocker, statin and Ace-inh. Repeat lipids and LFTs in 12 weeks.

## 2012-01-30 NOTE — Progress Notes (Signed)
History of Present Illness: 51 yo AAF with history of recent acute inferior MI 01/12/12 with DES x 1 mid RCA, former substance abuse, hyperlipidemia, hypothyroidism here today for cardiac follow up. She underwent emergent cardiac catheterization 01/12/12 with placement of DES to RCA to the occluded mid RCA. EF 45% on LV-gram with inferior hypokinesis. Echo 01/14/12 with improvement in LVEF to 55%.  Hospitalized x 3 days and had no complications.   She is here today for cardiac follow up. She is doing great. She has had no chest pain, SOB, palpitations, near syncope or syncope. Tolerating all meds.   Primary Care Provider: Sandford Craze  Last Lipid Profile:  Lipid Panel     Component Value Date/Time   CHOL 273* 01/13/2012 0303   TRIG 201* 01/13/2012 0303   HDL 36* 01/13/2012 0303   CHOLHDL 7.6 01/13/2012 0303   VLDL 40 01/13/2012 0303   LDLCALC 197* 01/13/2012 0303     Past Medical History  Diagnosis Date  . Coronary artery disease      inferior STEMI 01/13/12 s/p PTCA/DES to mid RCA. Initial EF 45% by cath but improved to 55-60% by echo 01/14/12  . GERD (gastroesophageal reflux disease)   . Cocaine abuse     Remote crack cocaine use. UDS negative for cocaine 01/2012  . Dyslipidemia     Trig 201, HDL 36, LDL 197 01/2012  . Abnormal TSH   . History of MI (myocardial infarction) 01/2012    Past Surgical History  Procedure Date  . Partial hysterectomy   . Cardiac catheterization 2010, 2013    Negative. 2013--100% blockage  . Abdominal hysterectomy     partial    Current Outpatient Prescriptions  Medication Sig Dispense Refill  . aspirin 81 MG tablet Take 1 tablet (81 mg total) by mouth daily.      Marland Kitchen atorvastatin (LIPITOR) 80 MG tablet Take 1 tablet (80 mg total) by mouth at bedtime.  30 tablet  6  . carvedilol (COREG) 3.125 MG tablet Take 1 tablet (3.125 mg total) by mouth 2 (two) times daily with a meal.  60 tablet  6  . levothyroxine (SYNTHROID, LEVOTHROID) 25 MCG tablet Take 1 tablet  (25 mcg total) by mouth daily.  30 tablet  1  . lisinopril (PRINIVIL,ZESTRIL) 2.5 MG tablet Take 1 tablet (2.5 mg total) by mouth daily.  30 tablet  6  . nitroGLYCERIN (NITROSTAT) 0.4 MG SL tablet Place 1 tablet (0.4 mg total) under the tongue every 5 (five) minutes x 3 doses as needed for chest pain.  25 tablet  4  . Ticagrelor (BRILINTA) 90 MG TABS tablet Take 1 tablet (90 mg total) by mouth 2 (two) times daily.  60 tablet  6    Allergies  Allergen Reactions  . Ciprofloxacin     REACTION: nause-sweating-chest tightness    History   Social History  . Marital Status: Single    Spouse Name: N/A    Number of Children: 1  . Years of Education: N/A   Occupational History  . 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 Smoking status: Former Smoker -- 33 years    Types: Cigarettes    Quit date: 06/13/2008  . Smokeless tobacco: Not on file   Comment: None since 2009   . Alcohol Use: No     former  . Drug Use: No     Crack cocaine; none since Jan 2006   . Sexually Active: Yes  Birth Control/ Protection: Surgical   Other Topics Concern  . Not on file   Social History Narrative   Regular exercise:  Walks 5-7 x weeklyCaffeine use:  1 dailyManager on duty at [employer] 6 GSOSingleDaughter age 57 and 27 yr old grandson- daughter lives in [place].Enjoys reading, sewing, Fill in puzzles, walkingCompleted GED.No pets.    Family History  Problem Relation Age of Onset  . Heart attack Mother 34    prior pacemaker  . Heart disease Mother   . Diabetes Mother   . Heart failure Father   . Heart disease Father     Review of Systems:  As stated in the HPI and otherwise negative.   BP 117/77  Pulse 64  Ht 5\' 6"  (1.676 m)  Wt 177 lb (80.287 kg)  BMI 28.57 kg/m2  Physical Examination: General: Well developed, well nourished, NAD HEENT: OP clear, mucus membranes moist SKIN: warm, dry. No rashes. Neuro: No focal deficits Musculoskeletal: Muscle strength 5/5 all ext Psychiatric:  Mood and affect normal Neck: No JVD, no carotid bruits, no thyromegaly, no lymphadenopathy. Lungs:Clear bilaterally, no wheezes, rhonci, crackles Cardiovascular: Regular rate and rhythm. No murmurs, gallops or rubs. Abdomen:Soft. Bowel sounds present. Non-tender.  Extremities: No lower extremity edema. Pulses are 2 + in the bilateral DP/PT.   Cardiac Cath: 01/12/12:  Left main: No obstructive disease.  Left Anterior Descending Artery: Large vessel that courses to the apex. Mild luminal irregularities in the mid vessel. Moderate sized diagonal branch with no disease.  Circumflex Artery: Large caliber vessel with serial 20% lesions in the mid vessel. OM1 is moderate sized with no disease. The second and third marginals are small. The fourth OM branch is moderate sized and has a 30% proximal stenosis.  Right Coronary Artery: Dominant vessel with 100% mid occlusion.  Left Ventricular Angiogram: LVEF 45%. Inferior wall hypokinesis.  Impression:  1. Acute inferior STEMI with occluded mid RCA  2. Successful PTCA/DES x 1 in the mid RCA  3. Hypokinesis of the inferior wall

## 2012-01-30 NOTE — Patient Instructions (Signed)
Your physician wants you to follow-up in: 6 months. You will receive a reminder letter in the mail two months in advance. If you don't receive a letter, please call our office to schedule the follow-up appointment.  Your physician recommends that you return for fasting lab work in: 12 weeks.  (week of April 22, 2012).  Lipid and Liver profile.  The lab opens at 8:30 AM every day

## 2012-01-31 ENCOUNTER — Telehealth: Payer: Self-pay | Admitting: Cardiovascular Disease

## 2012-01-31 NOTE — Telephone Encounter (Signed)
New msg Pt was here yesterday. She had some questions for you . Please call

## 2012-01-31 NOTE — Telephone Encounter (Signed)
Spoke with pt who is asking if OK to continue taking a multi vitamin.  I told her it was OK to continue this

## 2012-02-06 ENCOUNTER — Telehealth: Payer: Self-pay | Admitting: Cardiovascular Disease

## 2012-02-06 NOTE — Telephone Encounter (Signed)
Spoke with pt. She reports a cough for the last 5 days or so. Worse when lying down at night. No nasal drainage or symptoms of a cold.  Lisinopril was started during last hospitalization. Is on 2.5 mg daily. I told pt I would send to Dr. Clifton James for review

## 2012-02-06 NOTE — Telephone Encounter (Signed)
New msg Pt wants to talk to you about a cough that she has. Please call her back

## 2012-02-06 NOTE — Telephone Encounter (Signed)
Can we call her and  Have her stop the Lisinopril and see if her cough clears up? Thanks,chris

## 2012-02-07 NOTE — Telephone Encounter (Signed)
Patient is aware to stop taken Lisinopril medication.

## 2012-02-14 ENCOUNTER — Telehealth: Payer: Self-pay | Admitting: Cardiovascular Disease

## 2012-02-14 NOTE — Telephone Encounter (Signed)
New msg Pt wants to talk to you about her coughing at night. No chest pain. Please call

## 2012-02-14 NOTE — Telephone Encounter (Signed)
Spoke with pt. She reports she still has cough at times at night. Has nasal drainage and sneezing.  Using Claritin as needed.  Woke up coughing during night and noticed small blood clot in saliva when brushing her teeth.  None since.  She will monitor and call us back if any increase in bleeding. Has appt with primary care for physical on May 15.

## 2012-02-21 ENCOUNTER — Ambulatory Visit (INDEPENDENT_AMBULATORY_CARE_PROVIDER_SITE_OTHER): Payer: BC Managed Care – PPO | Admitting: Family

## 2012-02-21 ENCOUNTER — Other Ambulatory Visit: Payer: Self-pay | Admitting: Family

## 2012-02-21 ENCOUNTER — Ambulatory Visit (HOSPITAL_BASED_OUTPATIENT_CLINIC_OR_DEPARTMENT_OTHER)
Admission: RE | Admit: 2012-02-21 | Discharge: 2012-02-21 | Disposition: A | Discharge status: Home / Self Care | Payer: BC Managed Care – PPO | Source: Ambulatory Visit | Attending: Family | Admitting: Family

## 2012-02-21 ENCOUNTER — Ambulatory Visit (HOSPITAL_BASED_OUTPATIENT_CLINIC_OR_DEPARTMENT_OTHER): Admission: RE | Admit: 2012-02-21 | Payer: BC Managed Care – PPO | Source: Ambulatory Visit

## 2012-02-21 ENCOUNTER — Encounter: Payer: Self-pay | Admitting: Family

## 2012-02-21 ENCOUNTER — Encounter: Payer: Self-pay | Admitting: Gastroenterology

## 2012-02-21 DIAGNOSIS — Z0000 Encounter for general adult medical examination without abnormal findings: Secondary | ICD-10-CM | POA: Insufficient documentation

## 2012-02-21 DIAGNOSIS — E039 Hypothyroidism, unspecified: Secondary | ICD-10-CM

## 2012-02-21 DIAGNOSIS — Z1231 Encounter for screening mammogram for malignant neoplasm of breast: Secondary | ICD-10-CM | POA: Insufficient documentation

## 2012-02-21 DIAGNOSIS — Z23 Encounter for immunization: Secondary | ICD-10-CM

## 2012-02-21 DIAGNOSIS — R05 Cough: Secondary | ICD-10-CM

## 2012-02-21 LAB — LIPID PANEL: LDL Cholesterol: 52 mg/dL (ref 0–99)

## 2012-02-21 LAB — TSH: TSH: 2.302 u[IU]/mL (ref 0.350–4.500)

## 2012-02-21 LAB — HEPATIC FUNCTION PANEL
ALT: 31 U/L (ref 0–35)
Alkaline Phosphatase: 84 U/L (ref 39–117)
Indirect Bilirubin: 0.2 mg/dL (ref 0.0–0.9)
Total Protein: 7.3 g/dL (ref 6.0–8.3)

## 2012-02-21 MED ORDER — HYDROCOD POLST-CHLORPHEN POLST 10-8 MG/5ML PO LQCR
5.0000 mL | Freq: Every evening | ORAL | Status: DC | PRN
Start: 2012-02-21 — End: 2012-08-20

## 2012-02-21 MED ORDER — FLUTICASONE PROPIONATE 50 MCG/ACT NA SUSP: 2.0000 | Freq: Every day | NASAL | Status: DC

## 2012-02-21 NOTE — Assessment & Plan Note (Signed)
Likely related to recent ACE inhibitor use and post nasal drip.  Plan to have pt continue claritin.  Add flonase, tussionex HS prn.  Pt to call if symptoms worsen, or if no improvement.

## 2012-02-21 NOTE — Assessment & Plan Note (Signed)
Will refer for dexa, colo, mammo.  Tdap given today.

## 2012-02-21 NOTE — Progress Notes (Signed)
Addended by: Mervin Kung A on: 02/21/2012 10:37 AM   Modules accepted: Orders

## 2012-02-21 NOTE — Progress Notes (Signed)
Subjective:    Patient ID: Kaitlyn Bowman, female    DOB: 09-11-1961, 51 y.o.   MRN: [id number]  HPI  Ms.  Friesenhahn is a 51 yr old female who presents today for her complete physical.   Cough- x 2 weeks, improved with coming off of lisinopril, but not resolved.  Using cough drop.  She denies fever. Trouble sleeping at night. Delsym helping.   Hypothyroid- pt started on synthroid recently.  Reports feeling much better since starting.   Preventative-  She has never had colo, or dexa.  Last pap 5 yrs ago- s/p partial hyserectomy. Last tetanus >10 yrs ago.  Declines pneumovax.   Review of Systems  Constitutional: Negative for unexpected weight change.  HENT: Negative for hearing loss.   Eyes: Negative for visual disturbance.  Respiratory: Positive for cough.   Cardiovascular: Negative for chest pain, palpitations and leg swelling.  Gastrointestinal: Negative for nausea, vomiting and diarrhea.  Genitourinary: Negative for dysuria and hematuria.  Musculoskeletal: Negative for myalgias.       She reports some mild muscle soreness in her legs.  Improves with walking.    Neurological: Negative for headaches.  Hematological: Bruises/bleeds easily.  Psychiatric/Behavioral:       Denies depression   Past Medical History  Diagnosis Date  . Coronary artery disease      inferior STEMI 01/13/12 s/p PTCA/DES to mid RCA. Initial EF 45% by cath but improved to 55-60% by echo 01/14/12  . GERD (gastroesophageal reflux disease)   . Cocaine abuse     Remote crack cocaine use. UDS negative for cocaine 01/2012  . Dyslipidemia     Trig 201, HDL 36, LDL 197 01/2012  . Abnormal TSH   . History of MI (myocardial infarction) 01/2012    History   Social History  . Marital Status: Single    Spouse Name: N/A    Number of Children: 1  . Years of Education: N/A   Occupational History  . 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 Smoking status: Former Smoker -- 33 years    Types: Cigarettes    Quit  date: 06/13/2008  . Smokeless tobacco: Not on file   Comment: None since 2009   . Alcohol Use: No     former  . Drug Use: No     Crack cocaine; none since Jan 2006   . Sexually Active: Yes    Birth Control/ Protection: Surgical   Other Topics Concern  . Not on file   Social History Narrative   Regular exercise:  Walks 5-7 x weeklyCaffeine use:  1 dailyManager on duty at [employer] 6 GSOSingleDaughter age 20 and 98 yr old grandson- daughter lives in [place].Enjoys reading, sewing, Fill in puzzles, walkingCompleted GED.No pets.    Past Surgical History  Procedure Date  . Partial hysterectomy   . Cardiac catheterization 2010, 2013    Negative. 2013--100% blockage  . Abdominal hysterectomy     partial    Family History  Problem Relation Age of Onset  . Heart attack Mother 13    prior pacemaker  . Heart disease Mother   . Diabetes Mother   . Heart failure Father   . Heart disease Father     Allergies  Allergen Reactions  . Ciprofloxacin     REACTION: nause-sweating-chest tightness    Current Outpatient Prescriptions on File Prior to Visit  Medication Sig Dispense Refill  . aspirin 81 MG tablet Take 1 tablet (81 mg total)  by mouth daily.      Marland Kitchen atorvastatin (LIPITOR) 80 MG tablet Take 1 tablet (80 mg total) by mouth at bedtime.  30 tablet  6  . carvedilol (COREG) 3.125 MG tablet Take 1 tablet (3.125 mg total) by mouth 2 (two) times daily with a meal.  60 tablet  6  . levothyroxine (SYNTHROID, LEVOTHROID) 25 MCG tablet Take 1 tablet (25 mcg total) by mouth daily.  30 tablet  1  . nitroGLYCERIN (NITROSTAT) 0.4 MG SL tablet Place 1 tablet (0.4 mg total) under the tongue every 5 (five) minutes x 3 doses as needed for chest pain.  25 tablet  4  . Ticagrelor (BRILINTA) 90 MG TABS tablet Take 1 tablet (90 mg total) by mouth 2 (two) times daily.  60 tablet  6  . fluticasone (FLONASE) 50 MCG/ACT nasal spray Place 2 sprays into the nose daily.  16 g  3    BP 100/72  Pulse 66   Temp(Src) 98.2 F (36.8 C) (Oral)  Resp 16  Ht 5' 7.5" (1.715 m)  Wt 176 lb (79.833 kg)  BMI 27.16 kg/m2  SpO2 99%       Objective:   Physical Exam  Physical Exam  Constitutional: She is oriented to person, place, and time. She appears well-developed and well-nourished. No distress.  HENT: Poor dentition. Head: Normocephalic and atraumatic.  Right Ear: Tympanic membrane and ear canal normal.  Left Ear: Tympanic membrane and ear canal normal.  Mouth/Throat: Oropharynx is clear and moist.  Eyes: Pupils are equal, round, and reactive to light. No scleral icterus.  Neck: Normal range of motion. No thyromegaly present.  Cardiovascular: Normal rate and regular rhythm.   No murmur heard. Pulmonary/Chest: Effort normal and breath sounds normal. No respiratory distress. He has no wheezes. She has no rales. She exhibits no tenderness.  Abdominal: Soft. Bowel sounds are normal. He exhibits no distension and no mass. There is no tenderness. There is no rebound and no guarding.  Musculoskeletal: She exhibits no edema.  Lymphadenopathy:    She has no cervical adenopathy.  Neurological: She is alert and oriented to person, place, and time. She has normal reflexes. She exhibits normal muscle tone. Coordination normal.  Skin: Skin is warm and dry.  Psychiatric: She has a normal mood and affect. Her behavior is normal. Judgment and thought content normal.  Breasts: Examined lying Right: Without masses, retractions, discharge or axillary adenopathy.  Left: Without masses, retractions, discharge or axillary adenopathy.  Inguinal/mons: Normal without inguinal adenopathy  External genitalia: Normal  BUS/Urethra/Skene's glands: Normal  Bladder: Normal  Vagina: Normal  Cervix: surgically absent Uterus: absent Adnexa/parametria:  Rt: Without masses or tenderness.  Lt: Without masses or tenderness.  Anus and perineum: Normal           Assessment & Plan:    Assessment & Plan:

## 2012-02-21 NOTE — Patient Instructions (Signed)
Please complete lab work prior to leaving. Schedule mammogram on first floor. Schedule bone density at front desk. You will be contact about your referral for colonoscopy. Please let us know if you have not heard back within 1 week about your referral. Call if your cough worsens, if you develop fever, or if you are not feeling better in 1 week. Follow up in 3 months- sooner if problems/concerns.

## 2012-02-21 NOTE — Assessment & Plan Note (Signed)
Check TSH 

## 2012-02-22 LAB — URINALYSIS, ROUTINE W REFLEX MICROSCOPIC
Glucose, UA: NEGATIVE mg/dL
Leukocytes, UA: NEGATIVE
Protein, ur: NEGATIVE mg/dL
pH: 6 (ref 5.0–8.0)

## 2012-02-22 LAB — CBC WITH DIFFERENTIAL/PLATELET
Hemoglobin: 12 g/dL (ref 12.0–15.0)
Lymphs Abs: 3.4 10*3/uL (ref 0.7–4.0)
Monocytes Relative: 3 % (ref 3–12)
Neutro Abs: 3.4 10*3/uL (ref 1.7–7.7)
Neutrophils Relative %: 47 % (ref 43–77)
RBC: 4.62 MIL/uL (ref 3.87–5.11)
WBC: 7.2 10*3/uL (ref 4.0–10.5)

## 2012-02-22 LAB — BASIC METABOLIC PANEL WITH GFR
BUN: 7 mg/dL (ref 6–23)
Chloride: 108 mEq/L (ref 96–112)
GFR, Est African American: >89 mL/min
Glucose, Bld: 96 mg/dL (ref 70–99)
Potassium: 4.1 mEq/L (ref 3.5–5.3)

## 2012-02-27 ENCOUNTER — Telehealth: Payer: Self-pay | Admitting: Cardiovascular Disease

## 2012-02-27 NOTE — Telephone Encounter (Signed)
Patient calling for samples of Brilenta RX, she can be reached at [phone].

## 2012-02-27 NOTE — Telephone Encounter (Signed)
Spoke with pt and told her I would leave samples at front desk for her to pick up.  Brilinta 32 tablets--Lot [id number]--exp 03/09/14 left at desk

## 2012-03-12 ENCOUNTER — Encounter: Payer: Self-pay | Admitting: Gastroenterology

## 2012-03-25 ENCOUNTER — Other Ambulatory Visit: Payer: Self-pay | Admitting: Family

## 2012-03-26 ENCOUNTER — Telehealth: Payer: Self-pay | Admitting: Cardiovascular Disease

## 2012-03-26 NOTE — Telephone Encounter (Signed)
Pt needs samples Brilinta if not at work she can be reached at her cell number

## 2012-03-26 NOTE — Telephone Encounter (Signed)
Spoke with pt and told her I would leave samples at front desk for her to pick up.  Brilinta 90 mg --40 tabs--Lot [id number]--exp 04/08/14 left at desk.

## 2012-03-27 ENCOUNTER — Encounter: Payer: BC Managed Care – PPO | Admitting: Gastroenterology

## 2012-04-02 ENCOUNTER — Encounter: Payer: Self-pay | Admitting: Cardiovascular Disease

## 2012-04-02 ENCOUNTER — Ambulatory Visit (INDEPENDENT_AMBULATORY_CARE_PROVIDER_SITE_OTHER): Payer: BC Managed Care – PPO | Admitting: Cardiovascular Disease

## 2012-04-02 VITALS — BP 122/82 | HR 60 | Ht 66.0 in | Wt 173.0 lb

## 2012-04-02 DIAGNOSIS — I2510 Atherosclerotic heart disease of native coronary artery without angina pectoris: Secondary | ICD-10-CM

## 2012-04-02 NOTE — Patient Instructions (Addendum)
Your physician wants you to follow-up in:  6 months. You will receive a reminder letter in the mail two months in advance. If you don't receive a letter, please call our office to schedule the follow-up appointment.   

## 2012-04-02 NOTE — Progress Notes (Signed)
History of Present Illness: 51 yo AAF with history of recent acute inferior MI 01/12/12 with DES x 1 mid RCA, former substance abuse, hyperlipidemia, hypothyroidism here today for cardiac follow up. She underwent emergent cardiac catheterization 01/12/12 with placement of DES to RCA to the occluded mid RCA. EF 45% on LV-gram with inferior hypokinesis. Echo 01/14/12 with improvement in LVEF to 55%.   She is here today for cardiac follow up. She is doing great. She has had no chest pain, SOB, palpitations, near syncope or syncope. Tolerating all meds. She still has a cough. This is non-productive. Her cough was present 6 weeks ago but went away for several weeks and now has recurred. BP has been well controlled at home.   Primary Care Provider: Sandford Craze   Last Lipid Profile:  Lipid Panel    Component  Value  Date/Time    CHOL  273*  01/13/2012 0303    TRIG  201*  01/13/2012 0303    HDL  36*  01/13/2012 0303    CHOLHDL  7.6  01/13/2012 0303    VLDL  40  01/13/2012 0303    LDLCALC  197*  01/13/2012 0303     Past Medical History  Diagnosis Date  . Coronary artery disease      inferior STEMI 01/13/12 s/p PTCA/DES to mid RCA. Initial EF 45% by cath but improved to 55-60% by echo 01/14/12  . GERD (gastroesophageal reflux disease)   . Cocaine abuse     Remote crack cocaine use. UDS negative for cocaine 01/2012  . Dyslipidemia     Trig 201, HDL 36, LDL 197 01/2012  . Abnormal TSH   . History of MI (myocardial infarction) 01/2012    Past Surgical History  Procedure Date  . Partial hysterectomy   . Cardiac catheterization 2010, 2013    Negative. 2013--100% blockage  . Abdominal hysterectomy     partial    Current Outpatient Prescriptions  Medication Sig Dispense Refill  . aspirin 81 MG tablet Take 1 tablet (81 mg total) by mouth daily.      Marland Kitchen atorvastatin (LIPITOR) 80 MG tablet Take 1 tablet (80 mg total) by mouth at bedtime.  30 tablet  6  . carvedilol (COREG) 3.125 MG tablet Take 1 tablet  (3.125 mg total) by mouth 2 (two) times daily with a meal.  60 tablet  6  . chlorpheniramine-HYDROcodone (TUSSIONEX) 10-8 MG/5ML LQCR Take 5 mLs by mouth at bedtime as needed.  140 mL  0  . fluticasone (FLONASE) 50 MCG/ACT nasal spray Place 2 sprays into the nose daily.  16 g  3  . levothyroxine (SYNTHROID, LEVOTHROID) 25 MCG tablet TAKE ONE TABLET BY MOUTH ONE TIME DAILY  30 tablet  2  . nitroGLYCERIN (NITROSTAT) 0.4 MG SL tablet Place 1 tablet (0.4 mg total) under the tongue every 5 (five) minutes x 3 doses as needed for chest pain.  25 tablet  4  . Ticagrelor (BRILINTA) 90 MG TABS tablet Take 1 tablet (90 mg total) by mouth 2 (two) times daily.  60 tablet  6    Allergies  Allergen Reactions  . Ciprofloxacin     REACTION: nause-sweating-chest tightness    History   Social History  . Marital Status: Single    Spouse Name: N/A    Number of Children: 1  . Years of Education: N/A   Occupational History  . 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 Smoking status: Former Smoker --  33 years    Types: Cigarettes    Quit date: 06/13/2008  . Smokeless tobacco: Not on file   Comment: None since 2009   . Alcohol Use: No     former  . Drug Use: No     Crack cocaine; none since Jan 2006   . Sexually Active: Yes    Birth Control/ Protection: Surgical   Other Topics Concern  . Not on file   Social History Narrative   Regular exercise:  Walks 5-7 x weeklyCaffeine use:  1 dailyManager on duty at [employer] 6 GSOSingleDaughter age 64 and 81 yr old grandson- daughter lives in [place].Enjoys reading, sewing, Fill in puzzles, walkingCompleted GED.No pets.    Family History  Problem Relation Age of Onset  . Heart attack Mother 72    prior pacemaker  . Heart disease Mother   . Diabetes Mother   . Heart failure Father   . Heart disease Father     Review of Systems:  As stated in the HPI and otherwise negative.   BP 122/82  Pulse 60  Ht 5\' 6"  (1.676 m)  Wt 173 lb (78.472 kg)   BMI 27.92 kg/m2  Physical Examination: General: Well developed, well nourished, NAD HEENT: OP clear, mucus membranes moist SKIN: warm, dry. No rashes. Neuro: No focal deficits Musculoskeletal: Muscle strength 5/5 all ext Psychiatric: Mood and affect normal Neck: No JVD, no carotid bruits, no thyromegaly, no lymphadenopathy. Lungs:Clear bilaterally, no wheezes, rhonci, crackles Cardiovascular: Regular rate and rhythm. No murmurs, gallops or rubs. Abdomen:Soft. Bowel sounds present. Non-tender.  Extremities: No lower extremity edema. Pulses are 2 + in the bilateral DP/PT.

## 2012-04-02 NOTE — Assessment & Plan Note (Signed)
Stable. No chest pains. Continue current medical therapy. Cough appears unrelated to medications. NO changes today. Lipids well controlled. BP well controlled.

## 2012-04-15 ENCOUNTER — Encounter: Payer: Self-pay | Admitting: Gastroenterology

## 2012-04-15 ENCOUNTER — Ambulatory Visit (INDEPENDENT_AMBULATORY_CARE_PROVIDER_SITE_OTHER): Payer: BC Managed Care – PPO | Admitting: Gastroenterology

## 2012-04-15 VITALS — BP 110/70 | HR 64 | Ht 66.0 in | Wt 174.2 lb

## 2012-04-15 DIAGNOSIS — Z1211 Encounter for screening for malignant neoplasm of colon: Secondary | ICD-10-CM | POA: Insufficient documentation

## 2012-04-15 NOTE — Progress Notes (Signed)
History of Present Illness: Kaitlyn Bowman 51 year old Afro-American female deferred at the request of [name] O. Lendell Caprice for screening colonoscopy. She has no GI complaints including change of bowel habits, abdominal pain, melena or hematochezia She had an acute MI in March, 2013.  A drug-eluting stent was placed and she was put on Brilinta.    Past Medical History  Diagnosis Date  . Coronary artery disease      inferior STEMI 01/13/12 s/p PTCA/DES to mid RCA. Initial EF 45% by cath but improved to 55-60% by echo 01/14/12  . GERD (gastroesophageal reflux disease)   . Cocaine abuse     Remote crack cocaine use. UDS negative for cocaine 01/2012  . Dyslipidemia     Trig 201, HDL 36, LDL 197 01/2012  . Abnormal TSH   . History of MI (myocardial infarction) 01/2012  . Alcohol abuse   . Hypothyroidism    Past Surgical History  Procedure Date  . Cardiac catheterization 2010, 2013    Negative. 2013--100% blockage  . Abdominal hysterectomy     partial   family history includes Diabetes in her mother; Heart attack (age of onset:58) in her mother; Heart disease in her father and mother; Heart failure in her father; and Stroke in her brother. Current Outpatient Prescriptions  Medication Sig Dispense Refill  . aspirin 81 MG tablet Take 1 tablet (81 mg total) by mouth daily.      Marland Kitchen atorvastatin (LIPITOR) 80 MG tablet Take 1 tablet (80 mg total) by mouth at bedtime.  30 tablet  6  . carvedilol (COREG) 3.125 MG tablet Take 1 tablet (3.125 mg total) by mouth 2 (two) times daily with a meal.  60 tablet  6  . chlorpheniramine-HYDROcodone (TUSSIONEX) 10-8 MG/5ML LQCR Take 5 mLs by mouth at bedtime as needed.  140 mL  0  . levothyroxine (SYNTHROID, LEVOTHROID) 25 MCG tablet TAKE ONE TABLET BY MOUTH ONE TIME DAILY  30 tablet  2  . nitroGLYCERIN (NITROSTAT) 0.4 MG SL tablet Place 1 tablet (0.4 mg total) under the tongue every 5 (five) minutes x 3 doses as needed for chest pain.  25 tablet  4  . Ticagrelor (BRILINTA)  90 MG TABS tablet Take 1 tablet (90 mg total) by mouth 2 (two) times daily.  60 tablet  6   Allergies as of 04/15/2012 - Review Complete 04/15/2012  Allergen Reaction Noted  . Ciprofloxacin  07/06/2010    reports that she quit smoking about 3 years ago. Her smoking use included Cigarettes. She quit after 33 years of use. She has never used smokeless tobacco. She reports that she does not drink alcohol or use illicit drugs.     Review of Systems: Pertinent positive and negative review of systems were noted in the above HPI section. All other review of systems were otherwise negative.  Vital signs were reviewed in today's medical record Physical Exam: General: Well developed , well nourished, no acute distress Head: Normocephalic and atraumatic Eyes:  sclerae anicteric, EOMI Ears: Normal auditory acuity Mouth: No deformity or lesions Neck: Supple, no masses or thyromegaly Lungs: Clear throughout to auscultation Heart: Regular rate and rhythm; no murmurs, rubs or bruits Abdomen: Soft, non tender and non distended. No masses, hepatosplenomegaly or hernias noted. Normal Bowel sounds Rectal:deferred Musculoskeletal: Symmetrical with no gross deformities  Skin: No lesions on visible extremities Pulses:  Normal pulses noted Extremities: No clubbing, cyanosis, edema or deformities noted Neurological: Alert oriented x 4, grossly nonfocal Cervical Nodes:  No significant cervical adenopathy  Inguinal Nodes: No significant inguinal adenopathy Psychological:  Alert and cooperative. Normal mood and affect

## 2012-04-15 NOTE — Assessment & Plan Note (Signed)
Screening colonoscopy will be deferred until Brilinta is discontinued. She has no GI symptoms prompting any urgency for this procedure.

## 2012-04-15 NOTE — Patient Instructions (Addendum)
Patient will call back to schedule colonoscopy after she has completed taking her blood thinner

## 2012-04-23 ENCOUNTER — Other Ambulatory Visit: Payer: BC Managed Care – PPO

## 2012-04-29 ENCOUNTER — Telehealth: Payer: Self-pay | Admitting: Cardiovascular Disease

## 2012-04-29 NOTE — Telephone Encounter (Signed)
Pt needs [place] samples , will pick up tomorrow when comes in for lab work

## 2012-04-29 NOTE — Telephone Encounter (Signed)
Samples outfront 

## 2012-04-30 ENCOUNTER — Other Ambulatory Visit (INDEPENDENT_AMBULATORY_CARE_PROVIDER_SITE_OTHER): Payer: BC Managed Care – PPO

## 2012-04-30 DIAGNOSIS — I2510 Atherosclerotic heart disease of native coronary artery without angina pectoris: Secondary | ICD-10-CM

## 2012-04-30 LAB — LIPID PANEL
Cholesterol: 139 mg/dL (ref 0–200)
VLDL: 26.8 mg/dL (ref 0.0–40.0)

## 2012-04-30 LAB — HEPATIC FUNCTION PANEL
ALT: 41 U/L — ABNORMAL HIGH (ref 0–35)
AST: 30 U/L (ref 0–37)
Bilirubin, Direct: 0 mg/dL (ref 0.0–0.3)
Total Protein: 7.9 g/dL (ref 6.0–8.3)

## 2012-05-17 ENCOUNTER — Telehealth: Payer: Self-pay | Admitting: Cardiovascular Disease

## 2012-05-17 NOTE — Telephone Encounter (Signed)
Patient called back was told samples of brilinta left at 3rd floor front desk.Patient stated she is weak, has had dry cough for 4 days.Advised to see PCP.

## 2012-05-17 NOTE — Telephone Encounter (Signed)
New problem:    patient calling C/O cough - side effect from  medication - levothyroxine. Also  Sample of brilinta 90 mg.

## 2012-05-17 NOTE — Telephone Encounter (Signed)
I left a message for the patient to call. I went ahead and pulled samples of Brilinta 90 mg. Lot # [id number] , Exp: 08/09/14, # 48. I am not certain about the side effect of cough from levothyroxine.

## 2012-05-20 ENCOUNTER — Ambulatory Visit (INDEPENDENT_AMBULATORY_CARE_PROVIDER_SITE_OTHER): Payer: BC Managed Care – PPO | Admitting: Family

## 2012-05-20 ENCOUNTER — Ambulatory Visit (HOSPITAL_BASED_OUTPATIENT_CLINIC_OR_DEPARTMENT_OTHER)
Admission: RE | Admit: 2012-05-20 | Discharge: 2012-05-20 | Disposition: A | Discharge status: Home / Self Care | Payer: BC Managed Care – PPO | Source: Ambulatory Visit | Attending: Family | Admitting: Family

## 2012-05-20 DIAGNOSIS — K429 Umbilical hernia without obstruction or gangrene: Secondary | ICD-10-CM

## 2012-05-20 DIAGNOSIS — R05 Cough: Secondary | ICD-10-CM

## 2012-05-20 DIAGNOSIS — R059 Cough, unspecified: Secondary | ICD-10-CM | POA: Insufficient documentation

## 2012-05-20 DIAGNOSIS — Z87891 Personal history of nicotine dependence: Secondary | ICD-10-CM | POA: Insufficient documentation

## 2012-05-20 MED ORDER — FLUTICASONE PROPIONATE 50 MCG/ACT NA SUSP: 2.0000 | Freq: Every day | NASAL | Status: DC

## 2012-05-20 MED ORDER — LORATADINE 10 MG PO TABS: 10.0000 mg | ORAL_TABLET | Freq: Every day | ORAL | Status: DC

## 2012-05-20 NOTE — Assessment & Plan Note (Addendum)
New. Suspect that her "knot" is an umbilical hernia which today is reduced.   We discussed obtaining CT to confirm- she declines at this time.  We discussed signs/symptoms of incarcerated hernia and that she is to go to ED if this occurs.  Pt verbalizes understanding.

## 2012-05-20 NOTE — Assessment & Plan Note (Signed)
ACE cough is resolved.  Today's symptoms I believe are most likely allergy related.  I recommended to the patient that she start claritin and flonase.  Will obtain baseline cxr due to hx of tobacco abuse in the past.

## 2012-05-20 NOTE — Progress Notes (Signed)
Subjective:    Patient ID: Kaitlyn Bowman, female    DOB: 23-Nov-1960, 51 y.o.   MRN: [id number]  HPI  Ms.  Kaitlyn Bowman is a 51 yr old female who presents today with chief complaint of cough.  She reports that her cough started on 8/4.  Previously she had developed an ACE cough in the spring.  ACE was discontinued.  She reports that her cough resolved until 8/4.  Cough is described as dry.  Denies associated fever, chest pain or unusual shortness of breath.  She does report  associated "tickle in my throat," eyes watering and post nasal drip.  Abdominal "knot"- reports bulge above her belly button which is now "gone."    Review of Systems    see HPI  Past Medical History  Diagnosis Date  . Coronary artery disease      inferior STEMI 01/13/12 s/p PTCA/DES to mid RCA. Initial EF 45% by cath but improved to 55-60% by echo 01/14/12  . GERD (gastroesophageal reflux disease)   . Cocaine abuse     Remote crack cocaine use. UDS negative for cocaine 01/2012  . Dyslipidemia     Trig 201, HDL 36, LDL 197 01/2012  . Abnormal TSH   . History of MI (myocardial infarction) 01/2012  . Alcohol abuse   . Hypothyroidism     History   Social History  . Marital Status: Single    Spouse Name: N/A    Number of Children: 1  . Years of Education: N/A   Occupational History  . 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 Smoking status: Former Smoker -- 33 years    Types: Cigarettes    Quit date: 06/13/2008  . Smokeless tobacco: Never Used   Comment: None since 2009   . Alcohol Use: No     former  . Drug Use: No     Crack cocaine; none since Jan 2006   . Sexually Active: Yes    Birth Control/ Protection: Surgical   Other Topics Concern  . Not on file   Social History Narrative   Regular exercise:  Walks 5-7 x weeklyCaffeine use:  1 dailyManager on duty at [employer] 6 GSOSingleDaughter age 60 and 70 yr old grandson- daughter lives in [place].Enjoys reading, sewing, Fill in puzzles, walkingCompleted  GED.No pets.    Past Surgical History  Procedure Date  . Cardiac catheterization 2010, 2013    Negative. 2013--100% blockage  . Abdominal hysterectomy     partial    Family History  Problem Relation Age of Onset  . Heart attack Mother 64    prior pacemaker  . Heart disease Mother   . Diabetes Mother   . Heart failure Father     CHF  . Heart disease Father   . Stroke Brother     Allergies  Allergen Reactions  . Ciprofloxacin     REACTION: nause-sweating-chest tightness    Current Outpatient Prescriptions on File Prior to Visit  Medication Sig Dispense Refill  . aspirin 81 MG tablet Take 1 tablet (81 mg total) by mouth daily.      Marland Kitchen atorvastatin (LIPITOR) 80 MG tablet Take 1 tablet (80 mg total) by mouth at bedtime.  30 tablet  6  . carvedilol (COREG) 3.125 MG tablet Take 1 tablet (3.125 mg total) by mouth 2 (two) times daily with a meal.  60 tablet  6  . chlorpheniramine-HYDROcodone (TUSSIONEX) 10-8 MG/5ML LQCR Take 5 mLs by mouth at bedtime as  needed.  140 mL  0  . levothyroxine (SYNTHROID, LEVOTHROID) 25 MCG tablet TAKE ONE TABLET BY MOUTH ONE TIME DAILY  30 tablet  2  . nitroGLYCERIN (NITROSTAT) 0.4 MG SL tablet Place 1 tablet (0.4 mg total) under the tongue every 5 (five) minutes x 3 doses as needed for chest pain.  25 tablet  4  . Ticagrelor (BRILINTA) 90 MG TABS tablet Take 1 tablet (90 mg total) by mouth 2 (two) times daily.  60 tablet  6  . loratadine (CLARITIN) 10 MG tablet Take 1 tablet (10 mg total) by mouth daily.  30 tablet  0    There were no vitals taken for this visit.    Objective:   Physical Exam  Constitutional: She appears well-developed and well-nourished. No distress.  Cardiovascular: Normal rate and regular rhythm.   No murmur heard. Pulmonary/Chest: Effort normal and breath sounds normal. No respiratory distress. She has no wheezes. She has no rales. She exhibits no tenderness.  Abdominal: Soft. Bowel sounds are normal. She exhibits no  distension.    Psychiatric: She has a normal mood and affect. Her behavior is normal. Judgment and thought content normal.          Assessment & Plan:

## 2012-05-20 NOTE — Patient Instructions (Addendum)
Please complete your chest x-ray on the first floor.  Call if cough worsens, or if no improvement in 1-2 weeks.  Follow up in 3 months.

## 2012-05-21 ENCOUNTER — Telehealth: Payer: Self-pay | Admitting: *Deleted

## 2012-05-21 NOTE — Telephone Encounter (Signed)
Pt notified and voices understanding.  Notes Recorded by Kathi Simpers, CMA on 05/20/2012 at 5:36 PM Left message on voicemail to return my call. ------  Notes Recorded by Sandford Craze, NP on 05/20/2012 at 2:44 PM Pls let pt know cxr is negative. No sign of infection. I think cough is related to allergies

## 2012-05-22 ENCOUNTER — Ambulatory Visit: Payer: BC Managed Care – PPO | Admitting: Family

## 2012-06-11 ENCOUNTER — Telehealth: Payer: Self-pay | Admitting: Cardiovascular Disease

## 2012-06-11 ENCOUNTER — Other Ambulatory Visit: Payer: Self-pay | Admitting: Family

## 2012-06-11 NOTE — Telephone Encounter (Signed)
Message left on pt's identified voicemail that samples would be left at front desk for her to pick up. Left message to call back if questions. . Brilinta 90 mg --48 tablets--exp 09/08/14--Lot [id number]

## 2012-06-11 NOTE — Telephone Encounter (Signed)
Pt would like samples of Brilinta if we have them

## 2012-06-11 NOTE — Telephone Encounter (Signed)
Done/SLS 

## 2012-06-17 ENCOUNTER — Ambulatory Visit (INDEPENDENT_AMBULATORY_CARE_PROVIDER_SITE_OTHER): Payer: BC Managed Care – PPO | Admitting: Family

## 2012-06-17 ENCOUNTER — Encounter: Payer: Self-pay | Admitting: Family

## 2012-06-17 VITALS — BP 116/80 | HR 65 | Temp 98.3°F | Resp 16 | Ht 67.5 in | Wt 175.0 lb

## 2012-06-17 DIAGNOSIS — M791 Myalgia, unspecified site: Secondary | ICD-10-CM

## 2012-06-17 DIAGNOSIS — IMO0001 Reserved for inherently not codable concepts without codable children: Secondary | ICD-10-CM

## 2012-06-17 DIAGNOSIS — K429 Umbilical hernia without obstruction or gangrene: Secondary | ICD-10-CM

## 2012-06-17 NOTE — Patient Instructions (Addendum)
You will be contact about your referral to surgeon. Please let us know if you have not heard back within 1 week about your referral.   Hernia A hernia occurs when an internal organ pushes out through a weak spot in the abdominal wall. Hernias most commonly occur in the groin and around the navel. Hernias often can be pushed back into place (reduced). Most hernias tend to get worse over time. Some abdominal hernias can get stuck in the opening (irreducible or incarcerated hernia) and cannot be reduced. An irreducible abdominal hernia which is tightly squeezed into the opening is at risk for impaired blood supply (strangulated hernia). A strangulated hernia is a medical emergency. Because of the risk for an irreducible or strangulated hernia, surgery may be recommended to repair a hernia. CAUSES   Heavy lifting.   Prolonged coughing.   Straining to have a bowel movement.   A cut (incision) made during an abdominal surgery.  HOME CARE INSTRUCTIONS   Bed rest is not required. You may continue your normal activities.   Avoid lifting more than 10 pounds (4.5 kg) or straining.   Cough gently. If you are a smoker it is best to stop. Even the best hernia repair can break down with the continual strain of coughing. Even if you do not have your hernia repaired, a cough will continue to aggravate the problem.   Do not wear anything tight over your hernia. Do not try to keep it in with an outside bandage or truss. These can damage abdominal contents if they are trapped within the hernia sac.   Eat a normal diet.   Avoid constipation. Straining over long periods of time will increase hernia size and encourage breakdown of repairs. If you cannot do this with diet alone, stool softeners may be used.  SEEK IMMEDIATE MEDICAL CARE IF:   You have a fever.   You develop increasing abdominal pain.   You feel nauseous or vomit.   Your hernia is stuck outside the abdomen, looks discolored, feels hard, or  is tender.   You have any changes in your bowel habits or in the hernia that are unusual for you.   You have increased pain or swelling around the hernia.   You cannot push the hernia back in place by applying gentle pressure while lying down.  MAKE SURE YOU:   Understand these instructions.   Will watch your condition.   Will get help right away if you are not doing well or get worse.  Document Released: 09/25/2005 Document Revised: 09/14/2011 Document Reviewed: 05/14/2008 [facility] Patient Information 2012 [place], [place].

## 2012-06-17 NOTE — Progress Notes (Signed)
Subjective:    Patient ID: Kaitlyn Bowman, female    DOB: 1961-04-05, 51 y.o.   MRN: [id number]  HPI  Kaitlyn Bowman is a 51 yr old female who presents today with chief complaint of abdominal pain.  She reports that she went to [facility] ED on Friday and was told that she has an abdominal hernia.  "There is fat in it."  Per pt was told that ultimately it was "ok."  Records are currently unavailable for review.  Over the weekend she reports that she slept well.  She reports feeling "a little better."  Currently tol po's denies nausea.  Last BM was a few days ago.     Review of Systems See HPI  Past Medical History  Diagnosis Date  . Coronary artery disease      inferior STEMI 01/13/12 s/p PTCA/DES to mid RCA. Initial EF 45% by cath but improved to 55-60% by echo 01/14/12  . GERD (gastroesophageal reflux disease)   . Cocaine abuse     Remote crack cocaine use. UDS negative for cocaine 01/2012  . Dyslipidemia     Trig 201, HDL 36, LDL 197 01/2012  . Abnormal TSH   . History of MI (myocardial infarction) 01/2012  . Alcohol abuse   . Hypothyroidism     History   Social History  . Marital Status: Single    Spouse Name: N/A    Number of Children: 1  . Years of Education: N/A   Occupational History  . 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 Smoking status: Former Smoker -- 33 years    Types: Cigarettes    Quit date: 06/13/2008  . Smokeless tobacco: Never Used   Comment: None since 2009   . Alcohol Use: No     former  . Drug Use: No     Crack cocaine; none since Jan 2006   . Sexually Active: Yes    Birth Control/ Protection: Surgical   Other Topics Concern  . Not on file   Social History Narrative   Regular exercise:  Walks 5-7 x weeklyCaffeine use:  1 dailyManager on duty at [employer] 6 GSOSingleDaughter age 49 and 36 yr old grandson- daughter lives in [place].Enjoys reading, sewing, Fill in puzzles, walkingCompleted GED.No pets.    Past Surgical History  Procedure Date    . Cardiac catheterization 2010, 2013    Negative. 2013--100% blockage  . Abdominal hysterectomy     partial    Family History  Problem Relation Age of Onset  . Heart attack Mother 97    prior pacemaker  . Heart disease Mother   . Diabetes Mother   . Heart failure Father     CHF  . Heart disease Father   . Stroke Brother     Allergies  Allergen Reactions  . Ciprofloxacin     REACTION: nause-sweating-chest tightness    Current Outpatient Prescriptions on File Prior to Visit  Medication Sig Dispense Refill  . aspirin 81 MG tablet Take 1 tablet (81 mg total) by mouth daily.      Marland Kitchen atorvastatin (LIPITOR) 80 MG tablet Take 1 tablet (80 mg total) by mouth at bedtime.  30 tablet  6  . carvedilol (COREG) 3.125 MG tablet Take 1 tablet (3.125 mg total) by mouth 2 (two) times daily with a meal.  60 tablet  6  . chlorpheniramine-HYDROcodone (TUSSIONEX) 10-8 MG/5ML LQCR Take 5 mLs by mouth at bedtime as needed.  140 mL  0  . fluticasone (FLONASE) 50 MCG/ACT nasal spray Place 2 sprays into the nose daily.  16 g  3  . levothyroxine (SYNTHROID, LEVOTHROID) 25 MCG tablet TAKE ONE TABLET BY MOUTH ONE TIME DAILY  30 tablet  2  . loratadine (CLARITIN) 10 MG tablet Take 1 tablet (10 mg total) by mouth daily.  30 tablet  0  . nitroGLYCERIN (NITROSTAT) 0.4 MG SL tablet Place 1 tablet (0.4 mg total) under the tongue every 5 (five) minutes x 3 doses as needed for chest pain.  25 tablet  4  . Ticagrelor (BRILINTA) 90 MG TABS tablet Take 1 tablet (90 mg total) by mouth 2 (two) times daily.  60 tablet  6    BP 116/80  Pulse 65  Temp 98.3 F (36.8 C) (Oral)  Resp 16  Ht 5' 7.5" (1.715 m)  Wt 175 lb 0.6 oz (79.398 kg)  BMI 27.01 kg/m2  SpO2 98%       Objective:   Physical Exam  Constitutional: She appears well-developed and well-nourished. No distress.  Cardiovascular: Normal rate and regular rhythm.   No murmur heard. Pulmonary/Chest: Effort normal and breath sounds normal. No respiratory  distress. She has no wheezes. She has no rales. She exhibits no tenderness.  Abdominal: Soft. Bowel sounds are normal. She exhibits no distension. There is no rebound and no guarding.       Mild tenderness above umbilicus without palpable mass or hernia today.  Psychiatric: She has a normal mood and affect. Her speech is normal.          Assessment & Plan:

## 2012-06-18 LAB — CK: Total CK: 108 U/L (ref 7–177)

## 2012-06-18 NOTE — Assessment & Plan Note (Signed)
Will request CT for review from [facility], and then plan referral to surgeon- pt is agreeable.  In the meantime, we reviewed symptoms of incarcerated hernia and pt knows to return to the ED if this occurs.

## 2012-06-25 ENCOUNTER — Telehealth: Payer: Self-pay | Admitting: *Deleted

## 2012-06-25 DIAGNOSIS — K469 Unspecified abdominal hernia without obstruction or gangrene: Secondary | ICD-10-CM

## 2012-06-25 NOTE — Telephone Encounter (Signed)
Notified pt. Correction to documentation below, records reviewed were from [facility]; verified with Provider.

## 2012-06-25 NOTE — Telephone Encounter (Signed)
Pt left message requesting status of referral for her hernia.  Please advise.

## 2012-06-25 NOTE — Telephone Encounter (Signed)
I have reviewed the records from [facility] and made referral to surgeon, Myriam Jacobson will call her apt date/time.

## 2012-06-27 ENCOUNTER — Encounter (INDEPENDENT_AMBULATORY_CARE_PROVIDER_SITE_OTHER): Payer: Self-pay

## 2012-06-27 ENCOUNTER — Encounter (INDEPENDENT_AMBULATORY_CARE_PROVIDER_SITE_OTHER): Payer: Self-pay | Admitting: Surgery

## 2012-07-09 ENCOUNTER — Telehealth: Payer: Self-pay | Admitting: Cardiovascular Disease

## 2012-07-09 NOTE — Telephone Encounter (Signed)
Pt requesting samples of brilinta

## 2012-07-09 NOTE — Telephone Encounter (Signed)
Pt called back, she is aware that we do not have Brilinta 90 mg samples, and was offered a medication card to get one month supply. Patient states six months ago she was given the same card. The pharmacist could not activate the card. Pt would like for Dr. Gibson Ramp nurse to call her regarding this.

## 2012-07-09 NOTE — Telephone Encounter (Signed)
Left pt a message regarding not having Brilinta samples, except for a card for one month supply of medication. Pt to call back.

## 2012-07-10 ENCOUNTER — Ambulatory Visit (INDEPENDENT_AMBULATORY_CARE_PROVIDER_SITE_OTHER): Payer: BC Managed Care – PPO | Admitting: Surgery

## 2012-07-10 NOTE — Telephone Encounter (Signed)
We now have samples in office. I spoke with pt and told her I would leave at front desk for her to pick up.  Brilinta 90 mg--64 tablets- Lot [id number], exp. 09/08/14 left at front desk.

## 2012-07-18 ENCOUNTER — Telehealth: Payer: Self-pay | Admitting: Cardiovascular Disease

## 2012-07-18 ENCOUNTER — Telehealth: Payer: Self-pay | Admitting: *Deleted

## 2012-07-18 DIAGNOSIS — I2510 Atherosclerotic heart disease of native coronary artery without angina pectoris: Secondary | ICD-10-CM

## 2012-07-18 MED ORDER — CLOPIDOGREL BISULFATE 75 MG PO TABS: 75.0000 mg | ORAL_TABLET | Freq: Every day | ORAL | Status: DC

## 2012-07-18 NOTE — Telephone Encounter (Signed)
Reviewed pt's cough with Dr. Clifton James.  Will forward this note to Dr. Clifton James for review

## 2012-07-18 NOTE — Telephone Encounter (Signed)
Spoke with pt who reports cough went away for awhile but it has returned. She also states she stopped taking thyroid medicine for a couple of days but has since resumed.  No chest pain or shortness of breath.  I asked her to contact primary care for evaluation of cough.  Pt agreeable with this plan.

## 2012-07-18 NOTE — Telephone Encounter (Signed)
Pt cough has come back and it is worse this time what can she do

## 2012-07-18 NOTE — Telephone Encounter (Signed)
Left message to call back  

## 2012-07-18 NOTE — Telephone Encounter (Signed)
May be due to Brilinta. Can we have her stop Brilinta and start clopidogrel 75 mg po Qdaily? Thanks, chris

## 2012-07-18 NOTE — Telephone Encounter (Signed)
Spoke with pt and gave her instructions from Dr. Clifton James. I will send prescription for clopidogrel to [facility] on [address] In [place].

## 2012-07-20 ENCOUNTER — Telehealth: Payer: Self-pay | Admitting: Physician Assistant

## 2012-07-20 NOTE — Telephone Encounter (Signed)
Patient has been feeling lightheaded for several days, even after being told to stop the Brilinta. Her last dose was 3 days ago, and she just started Plavix today. She continued on low dose ASA. She denies any CP.  I advised her to check her BP, given that she is on Coreg. She is not on any other anti hypertensives. I suggested that the Coreg may be responsible for her sxs, although she has been taking this same low dose since 03/2012.  She agreed with this recommendation and appreciated the call back.

## 2012-08-10 ENCOUNTER — Other Ambulatory Visit: Payer: Self-pay | Admitting: Physician Assistant

## 2012-08-19 ENCOUNTER — Ambulatory Visit: Payer: BC Managed Care – PPO | Admitting: Family

## 2012-08-20 ENCOUNTER — Ambulatory Visit (INDEPENDENT_AMBULATORY_CARE_PROVIDER_SITE_OTHER): Payer: BC Managed Care – PPO | Admitting: Family

## 2012-08-20 ENCOUNTER — Encounter: Payer: Self-pay | Admitting: Family

## 2012-08-20 VITALS — BP 116/84 | HR 67 | Temp 98.1°F | Resp 16 | Ht 67.5 in | Wt 178.0 lb

## 2012-08-20 DIAGNOSIS — E039 Hypothyroidism, unspecified: Secondary | ICD-10-CM

## 2012-08-20 DIAGNOSIS — R05 Cough: Secondary | ICD-10-CM

## 2012-08-20 DIAGNOSIS — I10 Essential (primary) hypertension: Secondary | ICD-10-CM

## 2012-08-20 LAB — BASIC METABOLIC PANEL
BUN: 13 mg/dL (ref 6–23)
CO2: 25 mEq/L (ref 19–32)
Calcium: 9.7 mg/dL (ref 8.4–10.5)
Creat: 0.75 mg/dL (ref 0.50–1.10)
Glucose, Bld: 86 mg/dL (ref 70–99)
Sodium: 141 mEq/L (ref 135–145)

## 2012-08-20 LAB — TSH: TSH: 1.688 u[IU]/mL (ref 0.350–4.500)

## 2012-08-20 MED ORDER — BENZONATATE 100 MG PO CAPS: 100.0000 mg | ORAL_CAPSULE | Freq: Three times a day (TID) | ORAL | Status: AC | PRN

## 2012-08-20 MED ORDER — FLUTICASONE PROPIONATE 50 MCG/ACT NA SUSP: 2.0000 | Freq: Every day | NASAL | Status: DC

## 2012-08-20 MED ORDER — FAMOTIDINE 20 MG PO TABS: 20.0000 mg | ORAL_TABLET | Freq: Two times a day (BID) | ORAL | Status: DC

## 2012-08-20 NOTE — Patient Instructions (Addendum)
Please complete your blood work prior to leaving.  Please call if cough worsens or if no improvement in 1-2 weeks.   Please schedule a follow up appointment in 3 months.

## 2012-08-20 NOTE — Assessment & Plan Note (Signed)
Clinically stable on synthroid. Obtain tsh.  

## 2012-08-20 NOTE — Assessment & Plan Note (Signed)
Suspect allergic rhinitis and possible GERD could be contributing factors.  Resume clartin and flonase. Start pepcid.

## 2012-08-20 NOTE — Progress Notes (Signed)
Subjective:    Patient ID: Kaitlyn Bowman, female    DOB: Oct 25, 1960, 51 y.o.   MRN: [id number]  HPI  Kaitlyn Bowman is a 51 year old female who presents today for follow up.  Cough-  Reports that she has had dry cough x 1 week.  Reports associated tearing due to cough.  In the past she has had improvement with flonase but she has not used. Some improvement with cough drops.  Reports that cardiology took her off of Brilinta due cough.  Instead, she was placed on plavix.    Hypothyroid- reports tolerating synthroid.      Review of Systems See HPI  Past Medical History  Diagnosis Date  . Coronary artery disease      inferior STEMI 01/13/12 s/p PTCA/DES to mid RCA. Initial EF 45% by cath but improved to 55-60% by echo 01/14/12  . GERD (gastroesophageal reflux disease)   . Cocaine abuse     Remote crack cocaine use. UDS negative for cocaine 01/2012  . Dyslipidemia     Trig 201, HDL 36, LDL 197 01/2012  . Abnormal TSH   . History of MI (myocardial infarction) 01/2012  . Alcohol abuse   . Hypothyroidism     History   Social History  . Marital Status: Single    Spouse Name: N/A    Number of Children: 1  . Years of Education: N/A   Occupational History  . 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 Smoking status: Former Smoker -- 33 years    Types: Cigarettes    Quit date: 06/13/2008  . Smokeless tobacco: Never Used     Comment: None since 2009   . Alcohol Use: No     Comment: former  . Drug Use: No     Comment: Crack cocaine; none since Jan 2006   . Sexually Active: Yes    Birth Control/ Protection: Surgical   Other Topics Concern  . Not on file   Social History Narrative   Regular exercise:  Walks 5-7 x weeklyCaffeine use:  1 dailyManager on duty at [employer] 6 GSOSingleDaughter age 6 and 14 yr old grandson- daughter lives in [place].Enjoys reading, sewing, Fill in puzzles, walkingCompleted GED.No pets.    Past Surgical History  Procedure Date  . Cardiac  catheterization 2010, 2013    Negative. 2013--100% blockage  . Abdominal hysterectomy     partial    Family History  Problem Relation Age of Onset  . Heart attack Mother 73    prior pacemaker  . Heart disease Mother   . Diabetes Mother   . Heart failure Father     CHF  . Heart disease Father   . Stroke Brother     Allergies  Allergen Reactions  . Ciprofloxacin     REACTION: nause-sweating-chest tightness    Current Outpatient Prescriptions on File Prior to Visit  Medication Sig Dispense Refill  . aspirin 81 MG tablet Take 1 tablet (81 mg total) by mouth daily.      Marland Kitchen atorvastatin (LIPITOR) 80 MG tablet TAKE ONE TABLET BY MOUTH AT BEDTIME  30 tablet  3  . carvedilol (COREG) 3.125 MG tablet TAKE ONE TABLET BY MOUTH TWICE DAILY  60 tablet  3  . clopidogrel (PLAVIX) 75 MG tablet Take 1 tablet (75 mg total) by mouth daily.  30 tablet  6  . levothyroxine (SYNTHROID, LEVOTHROID) 25 MCG tablet TAKE ONE TABLET BY MOUTH ONE TIME DAILY  30 tablet  2  . nitroGLYCERIN (NITROSTAT) 0.4 MG SL tablet Place 1 tablet (0.4 mg total) under the tongue every 5 (five) minutes x 3 doses as needed for chest pain.  25 tablet  4  . famotidine (PEPCID) 20 MG tablet Take 1 tablet (20 mg total) by mouth 2 (two) times daily.  60 tablet  2  . loratadine (CLARITIN) 10 MG tablet Take 1 tablet (10 mg total) by mouth daily.  30 tablet  0    BP 116/84  Pulse 67  Temp 98.1 F (36.7 C) (Oral)  Resp 16  Ht 5' 7.5" (1.715 m)  Wt 178 lb (80.74 kg)  BMI 27.47 kg/m2  SpO2 99%       Objective:   Physical Exam  Constitutional: She appears well-developed and well-nourished. No distress.  Cardiovascular: Normal rate and regular rhythm.   No murmur heard. Pulmonary/Chest: Effort normal and breath sounds normal. No respiratory distress. She has no wheezes. She has no rales. She exhibits no tenderness.  Musculoskeletal: She exhibits no edema.  Skin: Skin is warm and dry.  Psychiatric: She has a normal mood and  affect. Her behavior is normal. Judgment and thought content normal.          Assessment & Plan:

## 2012-08-22 ENCOUNTER — Encounter: Payer: Self-pay | Admitting: Family

## 2012-08-29 ENCOUNTER — Telehealth: Payer: Self-pay | Admitting: *Deleted

## 2012-08-29 NOTE — Telephone Encounter (Signed)
tussionex 5 cc po hs prn # 115 cc. Follow up in office tomorrow. Will need cxr.

## 2012-08-29 NOTE — Telephone Encounter (Signed)
Pt reports continued dry cough and doesn't feel like she can take it any longer. Reports that she has been using flonase and claritin regularly without relief of symptoms. States that she can't sleep at night because of the cough and wants to know what she should do?  Please advise.

## 2012-08-30 ENCOUNTER — Encounter: Payer: Self-pay | Admitting: Family

## 2012-08-30 ENCOUNTER — Ambulatory Visit (INDEPENDENT_AMBULATORY_CARE_PROVIDER_SITE_OTHER): Payer: BC Managed Care – PPO | Admitting: Family

## 2012-08-30 VITALS — BP 118/80 | HR 84 | Temp 98.2°F | Resp 16 | Wt 180.1 lb

## 2012-08-30 DIAGNOSIS — R05 Cough: Secondary | ICD-10-CM

## 2012-08-30 MED ORDER — PREDNISONE 10 MG PO TABS: ORAL_TABLET | ORAL | Status: DC

## 2012-08-30 MED ORDER — AZITHROMYCIN 250 MG PO TABS: ORAL_TABLET | ORAL | Status: DC

## 2012-08-30 NOTE — Assessment & Plan Note (Signed)
No improvement with claritin and flonase- pt wishes to stop these meds but continue with the pepcid.  Will plan trial of pred taper as well as a zpak for treatment of possible bronchitis with bronchospasm.  Will also obtain CXR.  If no improvement with these measures plan referral to pulmonary.

## 2012-08-30 NOTE — Patient Instructions (Addendum)
Please complete chest x-ray on the first floor prior to leaving. Call if your cough is not improved in 1 week.

## 2012-08-30 NOTE — Progress Notes (Signed)
Subjective:    Patient ID: Kaitlyn Bowman, female    DOB: Sep 05, 1961, 51 y.o.   MRN: [id number]  HPI  Kaitlyn Bowman is a 51 yr old female who presents today with chief complaint of cough.  She was seen on 11/12 with complaint of cough and claritin, flonase and pepcid was added.  She reports no improvement in her cough with these measures. Having trouble sleeping due to cough. Cough is worse with laying on her side.    Review of Systems See HPI  Past Medical History  Diagnosis Date  . Coronary artery disease      inferior STEMI 01/13/12 s/p PTCA/DES to mid RCA. Initial EF 45% by cath but improved to 55-60% by echo 01/14/12  . GERD (gastroesophageal reflux disease)   . Cocaine abuse     Remote crack cocaine use. UDS negative for cocaine 01/2012  . Dyslipidemia     Trig 201, HDL 36, LDL 197 01/2012  . Abnormal TSH   . History of MI (myocardial infarction) 01/2012  . Alcohol abuse   . Hypothyroidism     History   Social History  . Marital Status: Single    Spouse Name: N/A    Number of Children: 1  . Years of Education: N/A   Occupational History  . 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 Smoking status: Former Smoker -- 33 years    Types: Cigarettes    Quit date: 06/13/2008  . Smokeless tobacco: Never Used     Comment: None since 2009   . Alcohol Use: No     Comment: former  . Drug Use: No     Comment: Crack cocaine; none since Jan 2006   . Sexually Active: Yes    Birth Control/ Protection: Surgical   Other Topics Concern  . Not on file   Social History Narrative   Regular exercise:  Walks 5-7 x weeklyCaffeine use:  1 dailyManager on duty at [employer] 6 GS[employer]ingleDaughter age 67 and 12 yr old grandson- daughter lives in [place].Enjoys reading, sewing, Fill in puzzles, walkingCompleted GED.No pets.    Past Surgical History  Procedure Date  . Cardiac catheterization 2010, 2013    Negative. 2013--100% blockage  . Abdominal hysterectomy     partial    Family History    Problem Relation Age of Onset  . Heart attack Mother 77    prior pacemaker  . Heart disease Mother   . Diabetes Mother   . Heart failure Father     CHF  . Heart disease Father   . Stroke Brother     Allergies  Allergen Reactions  . Ciprofloxacin     REACTION: nause-sweating-chest tightness    Current Outpatient Prescriptions on File Prior to Visit  Medication Sig Dispense Refill  . aspirin 81 MG tablet Take 1 tablet (81 mg total) by mouth daily.      Marland Kitchen atorvastatin (LIPITOR) 80 MG tablet TAKE ONE TABLET BY MOUTH AT BEDTIME  30 tablet  3  . carvedilol (COREG) 3.125 MG tablet TAKE ONE TABLET BY MOUTH TWICE DAILY  60 tablet  3  . clopidogrel (PLAVIX) 75 MG tablet Take 1 tablet (75 mg total) by mouth daily.  30 tablet  6  . famotidine (PEPCID) 20 MG tablet Take 1 tablet (20 mg total) by mouth 2 (two) times daily.  60 tablet  2  . fluticasone (FLONASE) 50 MCG/ACT nasal spray Place 2 sprays into the nose daily.  16  g  3  . levothyroxine (SYNTHROID, LEVOTHROID) 25 MCG tablet TAKE ONE TABLET BY MOUTH ONE TIME DAILY  30 tablet  2  . loratadine (CLARITIN) 10 MG tablet Take 1 tablet (10 mg total) by mouth daily.  30 tablet  0  . nitroGLYCERIN (NITROSTAT) 0.4 MG SL tablet Place 1 tablet (0.4 mg total) under the tongue every 5 (five) minutes x 3 doses as needed for chest pain.  25 tablet  4    BP 118/80  Pulse 84  Temp 98.2 F (36.8 C) (Oral)  Resp 16  Wt 180 lb 1.3 oz (81.684 kg)  SpO2 98%       Objective:   Physical Exam  Constitutional: She appears well-developed and well-nourished.  Cardiovascular: Normal rate and regular rhythm.   No murmur heard. Pulmonary/Chest: Effort normal and breath sounds normal. No respiratory distress. She has no wheezes. She has no rales. She exhibits no tenderness.  Musculoskeletal: She exhibits no edema.  Psychiatric:       Pt became tearful when discussing her frustration with her cough          Assessment & Plan:

## 2012-08-30 NOTE — Telephone Encounter (Signed)
Notified pt of need to be seen. Rx not sent to pharmacy. Pt scheduled appt for today at 3:15pm.

## 2012-09-10 ENCOUNTER — Telehealth: Payer: Self-pay | Admitting: *Deleted

## 2012-09-10 NOTE — Telephone Encounter (Signed)
I would like her to repeat- I see no record in epic of it being completed either.

## 2012-09-10 NOTE — Telephone Encounter (Signed)
Left detailed message on pt's cell# to call radiology to schedule time for CXR and to call me if any further questions.

## 2012-09-10 NOTE — Telephone Encounter (Signed)
Received message from pt requesting results of her chest xray from 08/30/12. Spoke with pt and she reports that she completed xray on 08/30/12. Reports resolution of her cough. Spoke with radiology and they state they have no record of pt completing xray. Should pt have xray repeated?  Please advise.

## 2012-09-13 ENCOUNTER — Ambulatory Visit (HOSPITAL_BASED_OUTPATIENT_CLINIC_OR_DEPARTMENT_OTHER)
Admission: RE | Admit: 2012-09-13 | Discharge: 2012-09-13 | Disposition: A | Discharge status: Home / Self Care | Payer: BC Managed Care – PPO | Source: Ambulatory Visit | Attending: Family | Admitting: Family

## 2012-09-13 DIAGNOSIS — R05 Cough: Secondary | ICD-10-CM | POA: Insufficient documentation

## 2012-09-13 DIAGNOSIS — R059 Cough, unspecified: Secondary | ICD-10-CM | POA: Insufficient documentation

## 2012-09-18 ENCOUNTER — Telehealth: Payer: Self-pay | Admitting: Family

## 2012-09-18 DIAGNOSIS — K429 Umbilical hernia without obstruction or gangrene: Secondary | ICD-10-CM

## 2012-09-18 MED ORDER — LEVOTHYROXINE SODIUM 25 MCG PO TABS: 25.0000 ug | ORAL_TABLET | Freq: Every day | ORAL | Status: DC

## 2012-09-18 NOTE — Telephone Encounter (Signed)
She should have a 3 month follow up please.

## 2012-09-18 NOTE — Telephone Encounter (Signed)
Refill sent to pharmacy. Please advise when pt will be due for follow up as she was just seen in November but has no future appts on file.

## 2012-09-18 NOTE — Telephone Encounter (Signed)
Refill-levothyroxine 0.025mg (75mcg)tab. Take one tablet by mouth one time daily. Qty 30 last fill 12.10.13

## 2012-09-19 NOTE — Telephone Encounter (Signed)
Please call pt to arrange appointment as below.

## 2012-09-20 NOTE — Telephone Encounter (Signed)
Patient scheduled an appointment for February for a 3 month follow up  Patient also states that she would like to proceed with referral to GI for her hernia.

## 2012-09-20 NOTE — Telephone Encounter (Signed)
Please advise re: referral request below.

## 2012-09-20 NOTE — Telephone Encounter (Signed)
Surgery usually takes care of this, referral has been placed.

## 2012-10-14 ENCOUNTER — Ambulatory Visit: Payer: BC Managed Care – PPO | Admitting: Cardiovascular Disease

## 2012-10-22 ENCOUNTER — Encounter (INDEPENDENT_AMBULATORY_CARE_PROVIDER_SITE_OTHER): Payer: Self-pay | Admitting: Surgery

## 2012-10-22 ENCOUNTER — Ambulatory Visit (INDEPENDENT_AMBULATORY_CARE_PROVIDER_SITE_OTHER): Payer: BC Managed Care – PPO | Admitting: Surgery

## 2012-10-22 ENCOUNTER — Encounter (INDEPENDENT_AMBULATORY_CARE_PROVIDER_SITE_OTHER): Payer: Self-pay | Admitting: General Surgery

## 2012-10-22 VITALS — BP 138/80 | HR 80 | Temp 98.6°F | Resp 14 | Ht 66.0 in | Wt 182.2 lb

## 2012-10-22 DIAGNOSIS — K429 Umbilical hernia without obstruction or gangrene: Secondary | ICD-10-CM

## 2012-10-22 NOTE — Progress Notes (Signed)
Subjective:     Patient ID: Kaitlyn Bowman, female   DOB: Jul 04, 1961, 52 y.o.   MRN: [id number]  HPI She was initially referred to me back in September for evaluation of umbilical hernia. She missed that appointment is now here back today. She has occasional discomfort at the umbilicus but no obstructive symptoms. She is currently on Plavix after having a heart stent placed in April of 2013 after a MI.  Review of Systems     Objective:   Physical Exam She does have a chronically incarcerated hernia above the umbilicus which shows tenderness. I believe is incarcerated with omentum. Her abdomen is otherwise soft and nontender    Assessment:     Umbilical hernia    Plan:     At some point we will plan surgical repair. She will be seeing her cardiologist in February. If she remains fairly symptomatic, I could do this repair while still on Plavix. She may elect to wait until after she is done with her Plavix.

## 2012-11-18 ENCOUNTER — Ambulatory Visit (INDEPENDENT_AMBULATORY_CARE_PROVIDER_SITE_OTHER): Payer: Managed Care, Other (non HMO) | Admitting: Family

## 2012-11-18 ENCOUNTER — Encounter: Payer: Self-pay | Admitting: Family

## 2012-11-18 VITALS — BP 124/78 | HR 68 | Temp 97.7°F | Resp 16 | Ht 67.5 in | Wt 182.0 lb

## 2012-11-18 DIAGNOSIS — K429 Umbilical hernia without obstruction or gangrene: Secondary | ICD-10-CM

## 2012-11-18 DIAGNOSIS — I2510 Atherosclerotic heart disease of native coronary artery without angina pectoris: Secondary | ICD-10-CM

## 2012-11-18 NOTE — Patient Instructions (Addendum)
Please schedule a follow up appointment in 3 months.

## 2012-11-18 NOTE — Progress Notes (Signed)
Subjective:    Patient ID: Kaitlyn Bowman, female    DOB: 12-20-1960, 52 y.o.   MRN: [id number]  HPI  Kaitlyn Bowman is a 52 yr old female who presents today for follow up.  1) HTN- continues beta blocker.   2) HA- reports intermittent HA ranges 3-5/10 for about 6 weeks.  She reports that symptoms improve with tylenol then return.  She attributes HA to beta blocker.  3) Hernia- saw Dr.Blackman who plans repair at some point, however this may be deferred until after she has completed her plavix therapy.   Review of Systems See hpi  Past Medical History  Diagnosis Date  . Coronary artery disease      inferior STEMI 01/13/12 s/p PTCA/DES to mid RCA. Initial EF 45% by cath but improved to 55-60% by echo 01/14/12  . GERD (gastroesophageal reflux disease)   . Cocaine abuse     Remote crack cocaine use. UDS negative for cocaine 01/2012  . Dyslipidemia     Trig 201, HDL 36, LDL 197 01/2012  . Abnormal TSH   . History of MI (myocardial infarction) 01/2012  . Alcohol abuse   . Hypothyroidism     History   Social History  . Marital Status: Single    Spouse Name: Kaitlyn Bowman    Number of Children: 1  . Years of Education: Kaitlyn Bowman   Occupational History  . 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 Smoking status: Former Smoker -- 33 years    Types: Cigarettes    Quit date: 06/13/2008  . Smokeless tobacco: Never Used     Comment: None since 2009   . Alcohol Use: No     Comment: former  . Drug Use: No     Comment: Crack cocaine; none since Jan 2006   . Sexually Active: Yes    Birth Control/ Protection: Surgical   Other Topics Concern  . Not on file   Social History Narrative   Regular exercise:  Walks 5-7 x weekly   Caffeine use:  1 daily   Manager on duty at [employer]   Single   Daughter age 69 and 33 yr old grandson- daughter lives in [place].   Enjoys reading, sewing, Fill in puzzles, walking   Completed GED.   No pets.                   Past Surgical History  Procedure  Laterality Date  . Cardiac catheterization  2010, 2013    Negative. 2013--100% blockage  . Abdominal hysterectomy      partial    Family History  Problem Relation Age of Onset  . Heart attack Mother 32    prior pacemaker  . Heart disease Mother   . Diabetes Mother   . Heart failure Father     CHF  . Heart disease Father   . Stroke Brother     Allergies  Allergen Reactions  . Ciprofloxacin     REACTION: nause-sweating-chest tightness    Current Outpatient Prescriptions on File Prior to Visit  Medication Sig Dispense Refill  . aspirin 81 MG tablet Take 1 tablet (81 mg total) by mouth daily.      Marland Kitchen atorvastatin (LIPITOR) 80 MG tablet TAKE ONE TABLET BY MOUTH AT BEDTIME  30 tablet  3  . carvedilol (COREG) 3.125 MG tablet TAKE ONE TABLET BY MOUTH TWICE DAILY  60 tablet  3  . clopidogrel (PLAVIX) 75 MG tablet Take 1  tablet (75 mg total) by mouth daily.  30 tablet  6  . levothyroxine (SYNTHROID, LEVOTHROID) 25 MCG tablet Take 1 tablet (25 mcg total) by mouth daily.  30 tablet  2  . nitroGLYCERIN (NITROSTAT) 0.4 MG SL tablet Place 1 tablet (0.4 mg total) under the tongue every 5 (five) minutes x 3 doses as needed for chest pain.  25 tablet  4   No current facility-administered medications on file prior to visit.    BP 124/78  Pulse 68  Temp(Src) 97.7 F (36.5 C) (Oral)  Resp 16  Ht 5' 7.5" (1.715 m)  Wt 182 lb (82.555 kg)  BMI 28.07 kg/m2  SpO2 97%       Objective:   Physical Exam  Constitutional: She appears well-developed and well-nourished. No distress.  Cardiovascular: Normal rate and regular rhythm.   No murmur heard. Pulmonary/Chest: Effort normal and breath sounds normal. No respiratory distress. She has no wheezes. She has no rales.  Neurological: She is alert.  Skin: Skin is warm. There is erythema.  Psychiatric: She has a normal mood and affect. Her behavior is normal. Thought content normal.          Assessment & Plan:

## 2012-11-21 NOTE — Assessment & Plan Note (Signed)
Clinically unchanged, no sign of incarceration. She is established with surgeon who wishes to defer repair until she has completed plavix rx.

## 2012-11-21 NOTE — Assessment & Plan Note (Signed)
Pt has upcoming apt with cardiology. Recommended that she discuss her concern  re: HA and coreg with cards. Cont prn coreg for now.

## 2012-11-26 ENCOUNTER — Encounter: Payer: Self-pay | Admitting: Cardiovascular Disease

## 2012-11-26 ENCOUNTER — Ambulatory Visit (INDEPENDENT_AMBULATORY_CARE_PROVIDER_SITE_OTHER): Payer: Managed Care, Other (non HMO) | Admitting: Cardiovascular Disease

## 2012-11-26 VITALS — BP 125/75 | HR 64 | Ht 66.0 in | Wt 181.0 lb

## 2012-11-26 DIAGNOSIS — I2510 Atherosclerotic heart disease of native coronary artery without angina pectoris: Secondary | ICD-10-CM

## 2012-11-26 NOTE — Progress Notes (Signed)
History of Present Illness: 52 yo AAF with history of acute inferior MI 01/12/12 with DES x 1 mid RCA, former substance abuse, hyperlipidemia, hypothyroidism here today for cardiac follow up. She underwent emergent cardiac catheterization 01/12/12 with placement of DES to RCA to the occluded mid RCA. EF 45% on LV-gram with inferior hypokinesis. Echo 01/14/12 with improvement in LVEF to 55%.   She is here today for cardiac follow up. She is doing great. She has had no chest pain, SOB, palpitations, near syncope or syncope. Tolerating all meds. BP has been well controlled at home. She walks often.   Primary Care Provider: Sandford Craze   Last Lipid Profile:Lipid Panel     Component Value Date/Time   CHOL 139 04/30/2012 0845   TRIG 134.0 04/30/2012 0845   HDL 43.00 04/30/2012 0845   CHOLHDL 3 04/30/2012 0845   VLDL 26.8 04/30/2012 0845   LDLCALC 69 04/30/2012 0845     Past Medical History  Diagnosis Date  . Coronary artery disease      inferior STEMI 01/13/12 s/p PTCA/DES to mid RCA. Initial EF 45% by cath but improved to 55-60% by echo 01/14/12  . GERD (gastroesophageal reflux disease)   . Cocaine abuse     Remote crack cocaine use. UDS negative for cocaine 01/2012  . Dyslipidemia     Trig 201, HDL 36, LDL 197 01/2012  . Abnormal TSH   . History of MI (myocardial infarction) 01/2012  . Alcohol abuse   . Hypothyroidism     Past Surgical History  Procedure Laterality Date  . Cardiac catheterization  2010, 2013    Negative. 2013--100% blockage  . Abdominal hysterectomy      partial    Current Outpatient Prescriptions  Medication Sig Dispense Refill  . aspirin 81 MG tablet Take 1 tablet (81 mg total) by mouth daily.      Marland Kitchen atorvastatin (LIPITOR) 80 MG tablet TAKE ONE TABLET BY MOUTH AT BEDTIME  30 tablet  3  . carvedilol (COREG) 3.125 MG tablet TAKE ONE TABLET BY MOUTH TWICE DAILY  60 tablet  3  . clopidogrel (PLAVIX) 75 MG tablet Take 1 tablet (75 mg total) by mouth daily.  30  tablet  6  . levothyroxine (SYNTHROID, LEVOTHROID) 25 MCG tablet Take 1 tablet (25 mcg total) by mouth daily.  30 tablet  2  . nitroGLYCERIN (NITROSTAT) 0.4 MG SL tablet Place 1 tablet (0.4 mg total) under the tongue every 5 (five) minutes x 3 doses as needed for chest pain.  25 tablet  4   No current facility-administered medications for this visit.    Allergies  Allergen Reactions  . Ciprofloxacin     REACTION: nause-sweating-chest tightness    History   Social History  . Marital Status: Single    Spouse Name: N/A    Number of Children: 1  . Years of Education: N/A   Occupational History  . 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 Smoking status: Former Smoker -- 33 years    Types: Cigarettes    Quit date: 06/13/2008  . Smokeless tobacco: Never Used     Comment: None since 2009   . Alcohol Use: No     Comment: former  . Drug Use: No     Comment: Crack cocaine; none since Jan 2006   . Sexually Active: Yes    Birth Control/ Protection: Surgical   Other Topics Concern  . 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   Enjoys reading, sewing, Fill in puzzles, walking   Completed GED.   No pets.                   Family History  Problem Relation Age of Onset  . Heart attack Mother 18    prior pacemaker  . Heart disease Mother   . Diabetes Mother   . Heart failure Father     CHF  . Heart disease Father   . Stroke Brother     Review of Systems:  As stated in the HPI and otherwise negative.   BP 125/75  Pulse 64  Ht 5\' 6"  (1.676 m)  Wt 181 lb (82.101 kg)  BMI 29.23 kg/m2  Physical Examination: General: Well developed, well nourished, NAD HEENT: OP clear, mucus membranes moist SKIN: warm, dry. No rashes. Neuro: No focal deficits Musculoskeletal: Muscle strength 5/5 all  ext Psychiatric: Mood and affect normal Neck: No JVD, no carotid bruits, no thyromegaly, no lymphadenopathy. Lungs:Clear bilaterally, no wheezes, rhonci, crackles Cardiovascular: Regular rate and rhythm. No murmurs, gallops or rubs. Abdomen:Soft. Bowel sounds present. Non-tender.  Extremities: No lower extremity edema. Pulses are 2 + in the bilateral DP/PT.  Assessment and Plan:   1. CAD: Stable. No chest pains. Continue current medical therapy. No changes today. Lipids well controlled. BP well controlled.

## 2012-11-26 NOTE — Patient Instructions (Addendum)
Your physician wants you to follow-up in: 6 months. You will receive a reminder letter in the mail two months in advance. If you don't receive a letter, please call our office to schedule the follow-up appointment. Fasting lab work will be done at this appt.    

## 2012-11-28 ENCOUNTER — Encounter: Payer: Self-pay | Admitting: Cardiovascular Disease

## 2012-12-02 ENCOUNTER — Ambulatory Visit (INDEPENDENT_AMBULATORY_CARE_PROVIDER_SITE_OTHER): Payer: Private Health Insurance - Indemnity | Admitting: Surgery

## 2012-12-02 ENCOUNTER — Encounter (INDEPENDENT_AMBULATORY_CARE_PROVIDER_SITE_OTHER): Payer: Self-pay | Admitting: Surgery

## 2012-12-02 VITALS — BP 128/82 | HR 59 | Temp 96.0°F | Resp 18 | Ht 66.0 in | Wt 184.4 lb

## 2012-12-02 DIAGNOSIS — K429 Umbilical hernia without obstruction or gangrene: Secondary | ICD-10-CM

## 2012-12-02 NOTE — Progress Notes (Signed)
Subjective:     Patient ID: Kaitlyn Bowman, female   DOB: 29-Dec-1960, 52 y.o.   MRN: [id number]  HPI She is here for followup of her hernia above the umbilicus. She has seen her cardiologist and they are planning on keeping her on Plavix until August. She has minimal discomfort from the hernia  Review of Systems     Objective:   Physical Exam On exam, I can reduce the hernia today unlike previous office visit    Assessment:     Reducible umbilical hernia     Plan:     Because she is minimally symptomatic, it is reasonable to wait until later this year when she is off Plavix. If she becomes increasingly symptomatic we will readdress the situation. I will see her back in August

## 2012-12-25 ENCOUNTER — Telehealth: Payer: Self-pay | Admitting: Family

## 2012-12-25 NOTE — Telephone Encounter (Signed)
Refill- levothyroxine 0.025mg ( ) tab. Take one tablet by mouth daily. Qty 30 last fill 2.10.14

## 2012-12-26 ENCOUNTER — Other Ambulatory Visit: Payer: Self-pay

## 2012-12-26 MED ORDER — ATORVASTATIN CALCIUM 80 MG PO TABS: ORAL_TABLET | ORAL | Status: DC

## 2012-12-26 MED ORDER — LEVOTHYROXINE SODIUM 25 MCG PO TABS: 25.0000 ug | ORAL_TABLET | Freq: Every day | ORAL | Status: DC

## 2012-12-26 NOTE — Telephone Encounter (Signed)
Refill sent, #30 x 2 refills.

## 2013-01-29 ENCOUNTER — Other Ambulatory Visit: Payer: Self-pay | Admitting: *Deleted

## 2013-01-29 MED ORDER — CARVEDILOL 3.125 MG PO TABS: ORAL_TABLET | ORAL | Status: DC

## 2013-02-21 ENCOUNTER — Other Ambulatory Visit: Payer: Self-pay | Admitting: *Deleted

## 2013-02-21 DIAGNOSIS — I2510 Atherosclerotic heart disease of native coronary artery without angina pectoris: Secondary | ICD-10-CM

## 2013-02-21 MED ORDER — CLOPIDOGREL BISULFATE 75 MG PO TABS: 75.0000 mg | ORAL_TABLET | Freq: Every day | ORAL | Status: DC

## 2013-03-28 ENCOUNTER — Telehealth: Payer: Self-pay | Admitting: Family

## 2013-03-28 ENCOUNTER — Encounter (HOSPITAL_BASED_OUTPATIENT_CLINIC_OR_DEPARTMENT_OTHER): Payer: Self-pay | Admitting: *Deleted

## 2013-03-28 ENCOUNTER — Emergency Department (HOSPITAL_BASED_OUTPATIENT_CLINIC_OR_DEPARTMENT_OTHER)
Admission: EM | Admit: 2013-03-28 | Discharge: 2013-03-28 | Disposition: A | Discharge status: Home / Self Care | Payer: Managed Care, Other (non HMO) | Attending: Emergency Medicine | Admitting: Emergency Medicine

## 2013-03-28 ENCOUNTER — Emergency Department (HOSPITAL_BASED_OUTPATIENT_CLINIC_OR_DEPARTMENT_OTHER): Payer: Managed Care, Other (non HMO)

## 2013-03-28 ENCOUNTER — Telehealth: Payer: Self-pay | Admitting: *Deleted

## 2013-03-28 DIAGNOSIS — R141 Gas pain: Secondary | ICD-10-CM | POA: Insufficient documentation

## 2013-03-28 DIAGNOSIS — E785 Hyperlipidemia, unspecified: Secondary | ICD-10-CM | POA: Insufficient documentation

## 2013-03-28 DIAGNOSIS — Z87891 Personal history of nicotine dependence: Secondary | ICD-10-CM | POA: Insufficient documentation

## 2013-03-28 DIAGNOSIS — R142 Eructation: Secondary | ICD-10-CM | POA: Insufficient documentation

## 2013-03-28 DIAGNOSIS — Z8719 Personal history of other diseases of the digestive system: Secondary | ICD-10-CM | POA: Insufficient documentation

## 2013-03-28 DIAGNOSIS — Z79899 Other long term (current) drug therapy: Secondary | ICD-10-CM | POA: Insufficient documentation

## 2013-03-28 DIAGNOSIS — Z9889 Other specified postprocedural states: Secondary | ICD-10-CM | POA: Insufficient documentation

## 2013-03-28 DIAGNOSIS — K429 Umbilical hernia without obstruction or gangrene: Secondary | ICD-10-CM | POA: Insufficient documentation

## 2013-03-28 DIAGNOSIS — I2510 Atherosclerotic heart disease of native coronary artery without angina pectoris: Secondary | ICD-10-CM | POA: Insufficient documentation

## 2013-03-28 DIAGNOSIS — Z90710 Acquired absence of both cervix and uterus: Secondary | ICD-10-CM | POA: Insufficient documentation

## 2013-03-28 DIAGNOSIS — E039 Hypothyroidism, unspecified: Secondary | ICD-10-CM | POA: Insufficient documentation

## 2013-03-28 DIAGNOSIS — I252 Old myocardial infarction: Secondary | ICD-10-CM | POA: Insufficient documentation

## 2013-03-28 DIAGNOSIS — Z7902 Long term (current) use of antithrombotics/antiplatelets: Secondary | ICD-10-CM | POA: Insufficient documentation

## 2013-03-28 LAB — URINALYSIS, ROUTINE W REFLEX MICROSCOPIC
Glucose, UA: NEGATIVE mg/dL
Hgb urine dipstick: NEGATIVE
Leukocytes, UA: NEGATIVE
Protein, ur: NEGATIVE mg/dL
pH: 7 (ref 5.0–8.0)

## 2013-03-28 MED ORDER — TRAMADOL HCL 50 MG PO TABS
50.0000 mg | ORAL_TABLET | Freq: Once | ORAL | Status: AC
  Administered 2013-03-28: 50 mg via ORAL
  Filled 2013-03-28: qty 1

## 2013-03-28 MED ORDER — TRAMADOL HCL 50 MG PO TABS: 50.0000 mg | ORAL_TABLET | Freq: Four times a day (QID) | ORAL | Status: DC | PRN

## 2013-03-28 MED ORDER — HYDROCODONE-ACETAMINOPHEN 5-325 MG PO TABS
1.0000 | ORAL_TABLET | Freq: Once | ORAL | Status: DC
  Filled 2013-03-28: qty 1

## 2013-03-28 MED ORDER — DOCUSATE SODIUM 100 MG PO CAPS: 100.0000 mg | ORAL_CAPSULE | Freq: Two times a day (BID) | ORAL | Status: DC

## 2013-03-28 MED ORDER — LEVOTHYROXINE SODIUM 25 MCG PO TABS: 25.0000 ug | ORAL_TABLET | Freq: Every day | ORAL | Status: DC

## 2013-03-28 NOTE — Telephone Encounter (Signed)
Patient Information:  Caller Name: Kaitlyn Bowman  Phone: [phone]  Patient: Kaitlyn, Bowman  Gender: Female  DOB: Jan 03, 1961  Age: 52 Years  PCP: Sandford Craze (Adults only)  Pregnant: No  Office Follow Up:  Does the office need to follow up with this patient?: Yes  Instructions For The Office: Please call back re: work in appointment.  RN Note:  Unable to rate umbilical hernia pain on pain scale since its not painful; "just feels uncomfortable" now.  Feels "real sore" when touches soft golfball-sized protrusion at umbilicus. Pain increases if bends down. Currently working until 1500. Requests appointment close to 1600 because has to pay her light bill before 1700.   Please call back to advise time for work in appointment.    Symptoms  Reason For Call & Symptoms: Intermittent pain at site of umbilical hernia and soft, golfball-sized protrusion after picking up a heavy bag.  Hernia repair surgery postponed until blood thinner discontinued.  Reviewed Health History In EMR: Yes  Reviewed Medications In EMR: Yes  Reviewed Allergies In EMR: Yes  Reviewed Surgeries / Procedures: Yes  Date of Onset of Symptoms: 03/26/2013  Treatments Tried: Absorbin Jr topical lotion, hot water bottle  Treatments Tried Worked: Yes OB / GYN:  LMP: Unknown  Guideline(s) Used:  Abdominal Pain - Female  Disposition Per Guideline:   See Today in Office  Reason For Disposition Reached:   Moderate or mild pain that comes and goes (cramps) lasts > 24 hours  Advice Given:  Rest:  Lie down and rest until you feel better.  Call Back If:  Abdominal pain is constant and present for more than 2 hours  You become worse.  Patient Will Follow Care Advice:  YES

## 2013-03-28 NOTE — Telephone Encounter (Signed)
Rx request to pharmacy/SLS  

## 2013-03-28 NOTE — ED Notes (Signed)
Patient states she has an abdominal hernia.  Is scheduled for surgery in September 2014.  States 2 days ago, she was at work and lifted a heavy bag and felt a pop in her abdomen and now has swelling in her mid abdomen.  Spoke with her PCP's office and was referred here for further evaluation.  States while driving here, she drank a soda and belched and actually feels better.

## 2013-03-28 NOTE — ED Provider Notes (Signed)
History     CSN: [id number]  Arrival date & time 03/28/13  1707   First MD Initiated Contact with Patient 03/28/13 1735      Chief Complaint  Patient presents with  . Abdominal Pain    (Consider location/radiation/quality/duration/timing/severity/associated sxs/prior treatment) HPI Pt with known ventral hernia and plans to have it repaired in Sept p/w pain after bending down to pick up an object at work. States her hernia "came out" at that point and she has had persistent pain. No fever, chills. No N/V. Pt had BM today and passing gas in ED. No urinary symptoms.  Past Medical History  Diagnosis Date  . Coronary artery disease      inferior STEMI 01/13/12 s/p PTCA/DES to mid RCA. Initial EF 45% by cath but improved to 55-60% by echo 01/14/12  . GERD (gastroesophageal reflux disease)   . Cocaine abuse     Remote crack cocaine use. UDS negative for cocaine 01/2012  . Dyslipidemia     Trig 201, HDL 36, LDL 197 01/2012  . Abnormal TSH   . History of MI (myocardial infarction) 01/2012  . Alcohol abuse   . Hypothyroidism     Past Surgical History  Procedure Laterality Date  . Cardiac catheterization  2010, 2013    Negative. 2013--100% blockage  . Abdominal hysterectomy      partial    Family History  Problem Relation Age of Onset  . Heart attack Mother 15    prior pacemaker  . Heart disease Mother   . Diabetes Mother   . Heart failure Father     CHF  . Heart disease Father   . Stroke Brother     History  Substance Use Topics  . Smoking status: Former Smoker -- 33 years    Types: Cigarettes    Quit date: 06/13/2008  . Smokeless tobacco: Never Used     Comment: None since 2009   . Alcohol Use: No     Comment: former    OB History   Grav Para Term Preterm Abortions TAB SAB Ect Mult Living                  Review of Systems  Constitutional: Negative for fever and chills.  HENT: Negative for neck pain and neck stiffness.   Respiratory: Negative for chest  tightness and shortness of breath.   Cardiovascular: Negative for chest pain.  Gastrointestinal: Positive for abdominal pain and abdominal distention. Negative for nausea, vomiting and diarrhea.  Musculoskeletal: Negative for myalgias and back pain.  Skin: Negative for rash and wound.  Neurological: Negative for dizziness, weakness, light-headedness and numbness.  All other systems reviewed and are negative.    Allergies  Ciprofloxacin  Home Medications   Current Outpatient Rx  Name  Route  Sig  Dispense  Refill  . atorvastatin (LIPITOR) 80 MG tablet      TAKE ONE TABLET BY MOUTH AT BEDTIME   30 tablet   3   . carvedilol (COREG) 3.125 MG tablet      TAKE ONE TABLET BY MOUTH TWICE DAILY   60 tablet   5   . clopidogrel (PLAVIX) 75 MG tablet   Oral   Take 1 tablet (75 mg total) by mouth daily.   30 tablet   6   . docusate sodium (COLACE) 100 MG capsule   Oral   Take 1 capsule (100 mg total) by mouth every 12 (twelve) hours.   60 capsule   0   .  levothyroxine (SYNTHROID, LEVOTHROID) 25 MCG tablet   Oral   Take 1 tablet (25 mcg total) by mouth daily.   30 tablet   2   . EXPIRED: nitroGLYCERIN (NITROSTAT) 0.4 MG SL tablet   Sublingual   Place 1 tablet (0.4 mg total) under the tongue every 5 (five) minutes x 3 doses as needed for chest pain.   25 tablet   4   . traMADol (ULTRAM) 50 MG tablet   Oral   Take 1 tablet (50 mg total) by mouth every 6 (six) hours as needed for pain.   15 tablet   0     BP 129/68  Temp(Src) 98.2 F (36.8 C) (Oral)  Resp 20  Physical Exam  Nursing note and vitals reviewed. Constitutional: She is oriented to person, place, and time. She appears well-developed and well-nourished. No distress.  HENT:  Head: Normocephalic and atraumatic.  Mouth/Throat: Oropharynx is clear and moist.  Eyes: EOM are normal. Pupils are equal, round, and reactive to light.  Neck: Normal range of motion. Neck supple.  Cardiovascular: Normal rate and  regular rhythm.   Pulmonary/Chest: Effort normal and breath sounds normal. No respiratory distress. She has no wheezes. She has no rales.  Abdominal: Soft. Bowel sounds are normal. She exhibits no distension and no mass. There is tenderness (superior periumbilical TTP. No definite mass appreciated. No rebound or guarding. ). There is no rebound and no guarding.  Musculoskeletal: Normal range of motion. She exhibits no edema and no tenderness.  Neurological: She is alert and oriented to person, place, and time.  Skin: Skin is warm and dry. No rash noted. No erythema.  Psychiatric: She has a normal mood and affect. Her behavior is normal.    ED Course  Procedures (including critical care time)  Labs Reviewed  URINALYSIS, ROUTINE W REFLEX MICROSCOPIC   Dg Abd Acute W/chest  03/28/2013   *RADIOLOGY REPORT*  Clinical Data: Abdominal pain and bloating.  ACUTE ABDOMEN SERIES (ABDOMEN 2 VIEW & CHEST 1 VIEW)  Comparison: Chest x-ray 09/13/2012.  Findings: The upright chest x-ray is normal.  Two views of the abdomen demonstrate a normal bowel gas pattern. There is some scattered air and stool in the colon and scattered air filled loops of small bowel without distention or air fluid levels.  No free air.  Soft tissue shadows are maintained.  No worrisome calcifications.  The bony structures are intact.  IMPRESSION:  1.  No acute cardiopulmonary findings. 2.  No plain film findings for an acute abdominal process.   Original Report Authenticated By: Rudie Meyer, M.D.     1. Recurrent umbilical hernia       MDM    Abd pain is resolved. No evidence of obstruction. Pt encouraged to avoid heavy lifting, straining. Return precautions given.       Loren Racer, MD 03/28/13 1904

## 2013-03-28 NOTE — Telephone Encounter (Signed)
LMOM with contact name and number for return call to [facility] office [[phone]] for available appointment with Maximino Sarin, FNP and/or report to Urgent or Emergent facility for A&E as this could be a surgical problem/SLS Spoke with patient and informed of recommendation; understood and agreed to being seen today at [facility] ED/SLS

## 2013-04-02 ENCOUNTER — Emergency Department (HOSPITAL_BASED_OUTPATIENT_CLINIC_OR_DEPARTMENT_OTHER)
Admission: EM | Admit: 2013-04-02 | Discharge: 2013-04-02 | Disposition: A | Discharge status: Home / Self Care | Payer: Managed Care, Other (non HMO) | Attending: Emergency Medicine | Admitting: Emergency Medicine

## 2013-04-02 ENCOUNTER — Encounter (HOSPITAL_BASED_OUTPATIENT_CLINIC_OR_DEPARTMENT_OTHER): Payer: Self-pay | Admitting: Family Medicine

## 2013-04-02 DIAGNOSIS — X500XXA Overexertion from strenuous movement or load, initial encounter: Secondary | ICD-10-CM | POA: Insufficient documentation

## 2013-04-02 DIAGNOSIS — IMO0002 Reserved for concepts with insufficient information to code with codable children: Secondary | ICD-10-CM | POA: Insufficient documentation

## 2013-04-02 DIAGNOSIS — R209 Unspecified disturbances of skin sensation: Secondary | ICD-10-CM | POA: Insufficient documentation

## 2013-04-02 DIAGNOSIS — M5412 Radiculopathy, cervical region: Secondary | ICD-10-CM

## 2013-04-02 DIAGNOSIS — Z79899 Other long term (current) drug therapy: Secondary | ICD-10-CM | POA: Insufficient documentation

## 2013-04-02 DIAGNOSIS — Z8719 Personal history of other diseases of the digestive system: Secondary | ICD-10-CM | POA: Insufficient documentation

## 2013-04-02 DIAGNOSIS — T148XXA Other injury of unspecified body region, initial encounter: Secondary | ICD-10-CM

## 2013-04-02 DIAGNOSIS — Z9889 Other specified postprocedural states: Secondary | ICD-10-CM | POA: Insufficient documentation

## 2013-04-02 DIAGNOSIS — E039 Hypothyroidism, unspecified: Secondary | ICD-10-CM | POA: Insufficient documentation

## 2013-04-02 DIAGNOSIS — E785 Hyperlipidemia, unspecified: Secondary | ICD-10-CM | POA: Insufficient documentation

## 2013-04-02 DIAGNOSIS — Z7902 Long term (current) use of antithrombotics/antiplatelets: Secondary | ICD-10-CM | POA: Insufficient documentation

## 2013-04-02 DIAGNOSIS — Y9389 Activity, other specified: Secondary | ICD-10-CM | POA: Insufficient documentation

## 2013-04-02 DIAGNOSIS — Y92410 Unspecified street and highway as the place of occurrence of the external cause: Secondary | ICD-10-CM | POA: Insufficient documentation

## 2013-04-02 DIAGNOSIS — I2510 Atherosclerotic heart disease of native coronary artery without angina pectoris: Secondary | ICD-10-CM | POA: Insufficient documentation

## 2013-04-02 DIAGNOSIS — I252 Old myocardial infarction: Secondary | ICD-10-CM | POA: Insufficient documentation

## 2013-04-02 DIAGNOSIS — Z87891 Personal history of nicotine dependence: Secondary | ICD-10-CM | POA: Insufficient documentation

## 2013-04-02 MED ORDER — METHOCARBAMOL 500 MG PO TABS
500.0000 mg | ORAL_TABLET | Freq: Once | ORAL | Status: AC
  Administered 2013-04-02: 500 mg via ORAL
  Filled 2013-04-02: qty 1

## 2013-04-02 MED ORDER — KETOROLAC TROMETHAMINE 60 MG/2ML IM SOLN
60.0000 mg | Freq: Once | INTRAMUSCULAR | Status: AC
  Administered 2013-04-02: 60 mg via INTRAMUSCULAR
  Filled 2013-04-02: qty 2

## 2013-04-02 MED ORDER — METHOCARBAMOL 500 MG PO TABS: 500.0000 mg | ORAL_TABLET | Freq: Three times a day (TID) | ORAL | Status: DC | PRN

## 2013-04-02 NOTE — ED Provider Notes (Signed)
History    CSN: [id number] Arrival date & time 04/02/13  1636  First MD Initiated Contact with Patient 04/02/13 1651     Chief Complaint  Patient presents with  . Shoulder Pain   (Consider location/radiation/quality/duration/timing/severity/associated sxs/prior Treatment) HPI Pt c/o 3 days of L shoulder and l upper back pain with numbness in L arm. Pt states she has been driving a car without power steering and went to turn sharply and thinks she pulled something in her back. No weakness in arms. Pt has decreased sensation and is stocking glove distribution from shoulder down. No neck pain or trauma. No SOB or chest pain.  Past Medical History  Diagnosis Date  . Coronary artery disease      inferior STEMI 01/13/12 s/p PTCA/DES to mid RCA. Initial EF 45% by cath but improved to 55-60% by echo 01/14/12  . GERD (gastroesophageal reflux disease)   . Cocaine abuse     Remote crack cocaine use. UDS negative for cocaine 01/2012  . Dyslipidemia     Trig 201, HDL 36, LDL 197 01/2012  . Abnormal TSH   . History of MI (myocardial infarction) 01/2012  . Alcohol abuse   . Hypothyroidism    Past Surgical History  Procedure Laterality Date  . Cardiac catheterization  2010, 2013    Negative. 2013--100% blockage  . Abdominal hysterectomy      partial   Family History  Problem Relation Age of Onset  . Heart attack Mother 3    prior pacemaker  . Heart disease Mother   . Diabetes Mother   . Heart failure Father     CHF  . Heart disease Father   . Stroke Brother    History  Substance Use Topics  . Smoking status: Former Smoker -- 33 years    Types: Cigarettes    Quit date: 06/13/2008  . Smokeless tobacco: Never Used     Comment: None since 2009   . Alcohol Use: No     Comment: former   OB History   Grav Para Term Preterm Abortions TAB SAB Ect Mult Living                 Review of Systems  HENT: Negative for neck pain.   Respiratory: Negative for shortness of breath.    Cardiovascular: Negative for chest pain.  Gastrointestinal: Negative for nausea, vomiting and abdominal pain.  Musculoskeletal: Positive for myalgias and back pain.  Skin: Negative for wound.  Neurological: Positive for numbness. Negative for dizziness, weakness, light-headedness and headaches.  All other systems reviewed and are negative.    Allergies  Ciprofloxacin  Home Medications   Current Outpatient Rx  Name  Route  Sig  Dispense  Refill  . atorvastatin (LIPITOR) 80 MG tablet      TAKE ONE TABLET BY MOUTH AT BEDTIME   30 tablet   3   . carvedilol (COREG) 3.125 MG tablet      TAKE ONE TABLET BY MOUTH TWICE DAILY   60 tablet   5   . clopidogrel (PLAVIX) 75 MG tablet   Oral   Take 1 tablet (75 mg total) by mouth daily.   30 tablet   6   . docusate sodium (COLACE) 100 MG capsule   Oral   Take 1 capsule (100 mg total) by mouth every 12 (twelve) hours.   60 capsule   0   . levothyroxine (SYNTHROID, LEVOTHROID) 25 MCG tablet   Oral   Take 1  tablet (25 mcg total) by mouth daily.   30 tablet   2   . methocarbamol (ROBAXIN) 500 MG tablet   Oral   Take 1 tablet (500 mg total) by mouth 3 (three) times daily as needed.   20 tablet   0   . EXPIRED: nitroGLYCERIN (NITROSTAT) 0.4 MG SL tablet   Sublingual   Place 1 tablet (0.4 mg total) under the tongue every 5 (five) minutes x 3 doses as needed for chest pain.   25 tablet   4   . traMADol (ULTRAM) 50 MG tablet   Oral   Take 1 tablet (50 mg total) by mouth every 6 (six) hours as needed for pain.   15 tablet   0    BP 117/75  Pulse 73  Temp(Src) 98.4 F (36.9 C) (Oral)  Resp 20  Ht 5\' 6"  (1.676 m)  SpO2 97% Physical Exam  Nursing note and vitals reviewed. Constitutional: She is oriented to person, place, and time. She appears well-developed and well-nourished. No distress.  HENT:  Head: Normocephalic and atraumatic.  Mouth/Throat: Oropharynx is clear and moist.  Eyes: EOM are normal. Pupils are  equal, round, and reactive to light.  Neck: Normal range of motion. Neck supple.  No midline TTP or posterior cervical spine  Cardiovascular: Normal rate and regular rhythm.   Pulmonary/Chest: Effort normal and breath sounds normal. No respiratory distress. She has no wheezes. She has no rales. She exhibits no tenderness.  Abdominal: Soft. Bowel sounds are normal. She exhibits no distension and no mass. There is no tenderness. There is no rebound and no guarding.  Musculoskeletal: Normal range of motion. She exhibits tenderness (TTP L rhomboid, trapezius and latisimus. ). She exhibits no edema.  Neurological: She is alert and oriented to person, place, and time.  5/5 motor in all ext, decreased sensation LUE in stocking glove distribution  Skin: Skin is warm and dry. No rash noted. No erythema.  Psychiatric: She has a normal mood and affect. Her behavior is normal.    ED Course  Procedures (including critical care time) Labs Reviewed - No data to display No results found. 1. Left cervical radiculopathy   2. Muscle strain     MDM  Will treat symptomatically. Spine surgery referral given for persistent symptoms.   Loren Racer, MD 04/02/13 1719

## 2013-04-02 NOTE — ED Notes (Addendum)
Pt c/o left shoulder pain and axillary soreness since Monday. Pt sts her "power steering went out and I think I hurt myself". Pt sts pain worse if laying down on shoulder and sts asorbine jr rub helps.

## 2013-05-02 ENCOUNTER — Other Ambulatory Visit: Payer: Self-pay | Admitting: *Deleted

## 2013-05-02 MED ORDER — ATORVASTATIN CALCIUM 80 MG PO TABS: ORAL_TABLET | ORAL | Status: DC

## 2013-05-14 ENCOUNTER — Telehealth: Payer: Self-pay | Admitting: Cardiovascular Disease

## 2013-05-14 ENCOUNTER — Telehealth: Payer: Self-pay | Admitting: Family

## 2013-05-14 NOTE — Telephone Encounter (Signed)
New Prob     Pt states she is having a lot of side effects to LEVOTHYROXINE. Please call.

## 2013-05-14 NOTE — Telephone Encounter (Signed)
Patient Information:  Caller Name: Kaitlyn Bowman  Phone: [phone]  Patient: Kaitlyn Bowman, Kaitlyn Bowman  Gender: Female  DOB: 1960-10-16  Age: 52 Years  PCP: Sandford Craze (Adults only)  Pregnant: No  Office Follow Up:  Does the office need to follow up with this patient?: No  Instructions For The Office: N/A  RN Note:  Patient would like to have appt for medication change and TSH recheck/ Patient declines to see another provider.  Office Contacted.   Stephanie in office booked appt for Friday 05/16/13 at 2;30 pm.  Patinet expressed understanding  Symptoms  Reason For Call & Symptoms: Patient states she thinks her mouth is getting dry, light headed and sweaty  and she relates this too her Levothyroxine. she left this medication off today and feels better.  She had her last Thyroid check in 08/2012 and would like her TSH Level rechecked  Reviewed Health History In EMR: Yes  Reviewed Medications In EMR: Yes  Reviewed Allergies In EMR: Yes  Reviewed Surgeries / Procedures: Yes  Date of Onset of Symptoms: 04/30/2013 OB / GYN:  LMP: Unknown  Guideline(s) Used:  Weakness (Generalized) and Fatigue  Disposition Per Guideline:   See Today in Office  Reason For Disposition Reached:   Patient wants to be seen  Advice Given:  Reassurance  Weakness often accompanies viral illnesses (e.g., colds and flu).  The weakness is usually worse the first 3 days of the illness, then gets better.  A fever can make you feel weak.  Here is some care advice that should help.  Call Back If:  Unable to stand or walk  Passes out  Breathing difficulty occurs  You become worse.  For All Fevers:  Drink cold fluids orally to prevent dehydration (Reason: good hydration replaces sweat and improves heat loss via skin). Adults should drink 6-8 glasses of water daily.  Dress in one layer of lightweight clothing and sleep with one light blanket.  For fevers 100-101 F (37.8-38.3 C) this is the only treatment and fever  medicine is unnecessary.  Patient Will Follow Care Advice:  YES

## 2013-05-14 NOTE — Telephone Encounter (Signed)
Spoke with pt. She reports she worked a double shift on Sunday. The next day she noticed dry mouth, light-headedness and being unable to stay focused. She thinks this is related to her thyroid medication. She is not having any chest pain. I have asked her to contact primary MD regarding this. Pt agreeable with this plan.

## 2013-05-16 ENCOUNTER — Ambulatory Visit (INDEPENDENT_AMBULATORY_CARE_PROVIDER_SITE_OTHER): Payer: Managed Care, Other (non HMO) | Admitting: Family

## 2013-05-16 ENCOUNTER — Ambulatory Visit (HOSPITAL_BASED_OUTPATIENT_CLINIC_OR_DEPARTMENT_OTHER)
Admission: RE | Admit: 2013-05-16 | Discharge: 2013-05-16 | Disposition: A | Discharge status: Home / Self Care | Payer: Managed Care, Other (non HMO) | Source: Ambulatory Visit | Attending: Family | Admitting: Family

## 2013-05-16 ENCOUNTER — Telehealth: Payer: Self-pay | Admitting: Family

## 2013-05-16 ENCOUNTER — Other Ambulatory Visit: Payer: Self-pay | Admitting: Family

## 2013-05-16 ENCOUNTER — Encounter: Payer: Self-pay | Admitting: Family

## 2013-05-16 VITALS — BP 110/80 | HR 58 | Temp 97.8°F | Resp 16 | Wt 183.0 lb

## 2013-05-16 DIAGNOSIS — E785 Hyperlipidemia, unspecified: Secondary | ICD-10-CM

## 2013-05-16 DIAGNOSIS — K429 Umbilical hernia without obstruction or gangrene: Secondary | ICD-10-CM

## 2013-05-16 DIAGNOSIS — R3129 Other microscopic hematuria: Secondary | ICD-10-CM

## 2013-05-16 DIAGNOSIS — R52 Pain, unspecified: Secondary | ICD-10-CM

## 2013-05-16 DIAGNOSIS — R1031 Right lower quadrant pain: Secondary | ICD-10-CM | POA: Insufficient documentation

## 2013-05-16 DIAGNOSIS — Z90710 Acquired absence of both cervix and uterus: Secondary | ICD-10-CM | POA: Insufficient documentation

## 2013-05-16 DIAGNOSIS — K7689 Other specified diseases of liver: Secondary | ICD-10-CM | POA: Insufficient documentation

## 2013-05-16 DIAGNOSIS — I2510 Atherosclerotic heart disease of native coronary artery without angina pectoris: Secondary | ICD-10-CM | POA: Insufficient documentation

## 2013-05-16 DIAGNOSIS — E039 Hypothyroidism, unspecified: Secondary | ICD-10-CM

## 2013-05-16 DIAGNOSIS — I7090 Unspecified atherosclerosis: Secondary | ICD-10-CM | POA: Insufficient documentation

## 2013-05-16 DIAGNOSIS — I10 Essential (primary) hypertension: Secondary | ICD-10-CM

## 2013-05-16 DIAGNOSIS — M545 Low back pain, unspecified: Secondary | ICD-10-CM

## 2013-05-16 DIAGNOSIS — N951 Menopausal and female climacteric states: Secondary | ICD-10-CM

## 2013-05-16 LAB — POCT URINALYSIS DIPSTICK
Bilirubin, UA: NEGATIVE
Glucose, UA: NEGATIVE
Ketones, UA: NEGATIVE
Nitrite, UA: NEGATIVE

## 2013-05-16 NOTE — Patient Instructions (Addendum)
Please complete lab work prior to leaving. Complete CT on the first floor.  Follow up in 2 months, sooner if problems/concerns.

## 2013-05-16 NOTE — Telephone Encounter (Addendum)
Called pt, reviewed CT results.  She does not feel that her abdominal pain isn't any worse now than it has been for the last several weeks.  Reports pain was acutely worse the day that she presented to the ED on 6/20.  Reports tolerating PO's.  Advised pt to go to the ED if worsening abdominal pain over the weekend. She verbalizes understanding.

## 2013-05-16 NOTE — Progress Notes (Signed)
Subjective:    Patient ID: Kaitlyn Bowman, female    DOB: June 16, 1961, 52 y.o.   MRN: [id number]  HPI  Kaitlyn Bowman is a 52 yr old female who presents today with chief complaint of dry mouth.  She reports some aching in her legs after prolonged standing at her job. She has been working on her feet.  She reports .  Denies SOB or chest pain. Reports that she does not have the "get up and go."  She reports dizziness.  She stopped thyroid medication 4 days ago.  Thinks her symptoms have improved since she stopped synthroid.  Has trouble getting up in the AM's for about a month.  Has a Writer at work.  She works at [employer].  She is having trouble sleeping.  Appetite is "not good."   Reports lack of sex drive.    She continues to have some pain from her abdominal hernia. Has been having some new pain in the right lower abdomen.  Review of Systems   Past Medical History  Diagnosis Date  . Coronary artery disease      inferior STEMI 01/13/12 s/p PTCA/DES to mid RCA. Initial EF 45% by cath but improved to 55-60% by echo 01/14/12  . GERD (gastroesophageal reflux disease)   . Cocaine abuse     Remote crack cocaine use. UDS negative for cocaine 01/2012  . Dyslipidemia     Trig 201, HDL 36, LDL 197 01/2012  . Abnormal TSH   . History of MI (myocardial infarction) 01/2012  . Alcohol abuse   . Hypothyroidism     History   Social History  . Marital Status: Single    Spouse Name: N/A    Number of Children: 1  . Years of Education: N/A   Occupational History  . 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 Smoking status: Former Smoker -- 33 years    Types: Cigarettes    Quit date: 06/13/2008  . Smokeless tobacco: Never Used     Comment: None since 2009   . Alcohol Use: No     Comment: former  . Drug Use: No     Comment: Crack cocaine; none since Jan 2006   . Sexually Active: Yes    Birth Control/ Protection: Surgical   Other Topics Concern  . 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   Enjoys reading, sewing, Fill in puzzles, walking   Completed GED.   No pets.                   Past Surgical History  Procedure Laterality Date  . Cardiac catheterization  2010, 2013    Negative. 2013--100% blockage  . Abdominal hysterectomy      partial    Family History  Problem Relation Age of Onset  . Heart attack Mother 2    prior pacemaker  . Heart disease Mother   . Diabetes Mother   . Heart failure Father     CHF  . Heart disease Father   . Stroke Brother     Allergies  Allergen Reactions  . Ciprofloxacin     REACTION: nause-sweating-chest tightness    Current Outpatient Prescriptions on File Prior to Visit  Medication Sig Dispense Refill  . atorvastatin (LIPITOR) 80 MG tablet TAKE ONE TABLET BY MOUTH AT BEDTIME  30 tablet  3  . carvedilol (COREG) 3.125 MG tablet TAKE ONE TABLET BY MOUTH TWICE DAILY  60 tablet  5  . clopidogrel (PLAVIX) 75 MG tablet Take 1 tablet (75 mg total) by mouth daily.  30 tablet  6  . docusate sodium (COLACE) 100 MG capsule Take 1 capsule (100 mg total) by mouth every 12 (twelve) hours.  60 capsule  0  . nitroGLYCERIN (NITROSTAT) 0.4 MG SL tablet Place 1 tablet (0.4 mg total) under the tongue every 5 (five) minutes x 3 doses as needed for chest pain.  25 tablet  4  . levothyroxine (SYNTHROID, LEVOTHROID) 25 MCG tablet Take 1 tablet (25 mcg total) by mouth daily.  30 tablet  2   No current facility-administered medications on file prior to visit.    BP 110/80  Pulse 58  Temp(Src) 97.8 F (36.6 C) (Oral)  Resp 16  Wt 183 lb 0.6 oz (83.026 kg)  BMI 29.56 kg/m2  SpO2 99%       Objective:   Physical Exam  Constitutional: She is oriented to person, place, and time. She appears well-developed and well-nourished. No distress.  HENT:  Head:  Normocephalic and atraumatic.  Cardiovascular: Normal rate and regular rhythm.   No murmur heard. Pulmonary/Chest: Effort normal and breath sounds normal. No respiratory distress. She has no wheezes. She has no rales. She exhibits no tenderness.  Musculoskeletal: She exhibits no edema.  Neurological: She is alert and oriented to person, place, and time.  Psychiatric: She has a normal mood and affect. Her behavior is normal. Judgment and thought content normal.  abd: mild periumbilical tenderness to palpation. RLQ tenderness is noted with mild guarding.  Abdomen is soft,  Non-distented with normoactive BS.       Assessment & Plan:

## 2013-05-17 ENCOUNTER — Telehealth: Payer: Self-pay | Admitting: Family

## 2013-05-17 DIAGNOSIS — E039 Hypothyroidism, unspecified: Secondary | ICD-10-CM

## 2013-05-17 LAB — URINE CULTURE: Colony Count: 55000

## 2013-05-17 LAB — HEPATIC FUNCTION PANEL
AST: 24 U/L (ref 0–37)
Albumin: 4.2 g/dL (ref 3.5–5.2)
Bilirubin, Direct: 0.1 mg/dL (ref 0.0–0.3)
Total Bilirubin: 0.3 mg/dL (ref 0.3–1.2)

## 2013-05-17 LAB — BASIC METABOLIC PANEL
BUN: 12 mg/dL (ref 6–23)
Calcium: 9.5 mg/dL (ref 8.4–10.5)
Chloride: 107 mEq/L (ref 96–112)
Creat: 0.74 mg/dL (ref 0.50–1.10)

## 2013-05-17 LAB — TSH: TSH: 3.472 u[IU]/mL (ref 0.350–4.500)

## 2013-05-17 NOTE — Telephone Encounter (Signed)
Late entry: Dr. Daphine Deutscher returned my call at 10:00 AM.  He reviewed the CT scan.  He recommended getting patient back in to see. Dr. Magnus Ivan in the office.  Bethann Berkshire, could you please call [facility] [place] [facility] on Monday AM and ask them to arrange follow up with Dr. Magnus Ivan this week please due to abdominal discomfort and changes on CT.

## 2013-05-17 NOTE — Telephone Encounter (Signed)
Paged [facility] MD on call.  Spoke to nurse in OR with Dr. Daphine Deutscher.  He will follow up on CT after he is out of surgery and call me back later today.

## 2013-05-19 DIAGNOSIS — N951 Menopausal and female climacteric states: Secondary | ICD-10-CM | POA: Insufficient documentation

## 2013-05-19 NOTE — Telephone Encounter (Signed)
Spoke with pt. She reports that she has not taken her synthroid in several days and is feeling "so much better."  Wants to stay off.  Advised pt that it would be ok to hold until she gets in to see endocrinology.  Will ask endo to consider armour thyroid.  Pt reports abdomen is feeling better.

## 2013-05-19 NOTE — Assessment & Plan Note (Signed)
CT abd/pelvis was performed due to new RLQ pain.  Appendix was normal.  ? Strangulation of fat in umbilical hernia on exam.  I did speak with Dr. Daphine Deutscher- md on call for [facility] surgery and he recommended follow up in office with Dr. Magnus Ivan- will arrange. See phone note.

## 2013-05-19 NOTE — Assessment & Plan Note (Signed)
Pt does not feel that she is tolerating synthroid. TSH stable on current dose. Will refer her to endo- see phone note.

## 2013-05-19 NOTE — Assessment & Plan Note (Addendum)
She is concerned re: decreased sex drive.  I suspect that this is due to hormonal changes/menopause. She is s/p partial hysterectomy.  She is not a candidate for HRT given her cardiac hx, but we did discuss other options such as citalopram for mood hot flashes etc.  She wishes to avoid additional medication at this time.

## 2013-05-19 NOTE — Telephone Encounter (Signed)
Spoke with Belenda Cruise at Dr Eliberto Ivory office; she will try to get Pattricia Boss (dr blackman's nurse) to let us know when they can get pt in to be seen. Per Pattricia Boss, the only time they can work her in is on Friday (8/15) at 4:45. Notified pt and she voices understanding. Pt wants to know if she can stay off of the levothyroxine for 2 months as she feels this is what caused her to be sick?  Please advise.

## 2013-05-23 ENCOUNTER — Ambulatory Visit (INDEPENDENT_AMBULATORY_CARE_PROVIDER_SITE_OTHER): Payer: Private Health Insurance - Indemnity | Admitting: Surgery

## 2013-05-23 ENCOUNTER — Encounter (INDEPENDENT_AMBULATORY_CARE_PROVIDER_SITE_OTHER): Payer: Self-pay | Admitting: General Surgery

## 2013-05-23 ENCOUNTER — Encounter (INDEPENDENT_AMBULATORY_CARE_PROVIDER_SITE_OTHER): Payer: Self-pay | Admitting: Surgery

## 2013-05-23 VITALS — BP 136/86 | HR 68 | Temp 97.4°F | Resp 14 | Ht 66.0 in | Wt 182.6 lb

## 2013-05-23 DIAGNOSIS — K429 Umbilical hernia without obstruction or gangrene: Secondary | ICD-10-CM

## 2013-05-23 NOTE — Progress Notes (Signed)
Subjective:     Patient ID: Kaitlyn Bowman, female   DOB: 18-Apr-1961, 52 y.o.   MRN: [id number]  HPI She is here for a followup of her umbilical hernia. Since I saw her last, it has gotten larger and more symptomatic.  Her recent CAT scan of the abdomen and pelvis showed that the hernia was larger but still only contained incarcerated fat. She is having increasing pain  Review of Systems     Objective:   Physical Exam On exam, there is a chronically incarcerated hernia above the umbilicus. Her abdomen is soft and otherwise nontender    Assessment:     Chronically incarcerated umbilical hernia     Plan:     Repair is now recommended with mesh. She will be seeing her cardiologist in early September and we will get clearance. She is on Plavix. If cardiology feels like she has to stay on the Plavix, I will be fine with keeping her on the Plavix for the surgery. I will schedule her surgery once clearance is confirmed

## 2013-06-16 ENCOUNTER — Encounter: Payer: Self-pay | Admitting: Endocrinology

## 2013-06-16 ENCOUNTER — Telehealth (INDEPENDENT_AMBULATORY_CARE_PROVIDER_SITE_OTHER): Payer: Self-pay | Admitting: Surgery

## 2013-06-16 ENCOUNTER — Other Ambulatory Visit: Payer: Managed Care, Other (non HMO)

## 2013-06-16 ENCOUNTER — Encounter: Payer: Self-pay | Admitting: Cardiovascular Disease

## 2013-06-16 ENCOUNTER — Ambulatory Visit (INDEPENDENT_AMBULATORY_CARE_PROVIDER_SITE_OTHER): Payer: Managed Care, Other (non HMO) | Admitting: Endocrinology

## 2013-06-16 ENCOUNTER — Ambulatory Visit (INDEPENDENT_AMBULATORY_CARE_PROVIDER_SITE_OTHER): Payer: Managed Care, Other (non HMO) | Admitting: Cardiovascular Disease

## 2013-06-16 ENCOUNTER — Other Ambulatory Visit (INDEPENDENT_AMBULATORY_CARE_PROVIDER_SITE_OTHER): Payer: Self-pay | Admitting: Surgery

## 2013-06-16 ENCOUNTER — Other Ambulatory Visit: Payer: Self-pay | Admitting: *Deleted

## 2013-06-16 VITALS — BP 116/84 | HR 53 | Ht 66.0 in | Wt 184.0 lb

## 2013-06-16 VITALS — BP 124/84 | HR 78 | Ht 65.0 in | Wt 184.0 lb

## 2013-06-16 DIAGNOSIS — E039 Hypothyroidism, unspecified: Secondary | ICD-10-CM

## 2013-06-16 DIAGNOSIS — I2510 Atherosclerotic heart disease of native coronary artery without angina pectoris: Secondary | ICD-10-CM

## 2013-06-16 DIAGNOSIS — Z01810 Encounter for preprocedural cardiovascular examination: Secondary | ICD-10-CM

## 2013-06-16 LAB — TSH: TSH: 4.18 u[IU]/mL (ref 0.35–5.50)

## 2013-06-16 LAB — LIPID PANEL
Cholesterol: 135 mg/dL (ref 0–200)
HDL: 41.8 mg/dL (ref >39.00)
LDL Cholesterol: 69 mg/dL (ref 0–99)
Total CHOL/HDL Ratio: 3
Triglycerides: 122 mg/dL (ref 0.0–149.0)
VLDL: 24.4 mg/dL (ref 0.0–40.0)

## 2013-06-16 MED ORDER — ASPIRIN EC 81 MG PO TBEC: 81.0000 mg | DELAYED_RELEASE_TABLET | Freq: Every day | ORAL | Status: AC

## 2013-06-16 NOTE — Telephone Encounter (Signed)
Pt aware of financial obligation will call back to schedule

## 2013-06-16 NOTE — Patient Instructions (Signed)
Your physician wants you to follow-up in:  12 months.  You will receive a reminder letter in the mail two months in advance. If you don't receive a letter, please call our office to schedule the follow-up appointment.  Your physician has recommended you make the following change in your medication:  Stop Clopidogrel. Stop Coreg (carvedilol)

## 2013-06-16 NOTE — Progress Notes (Signed)
Subjective:    Patient ID: Kaitlyn Bowman, female    DOB: 1961/07/21, 52 y.o.   MRN: [id number]  HPI Pt reports she was rx'ed with primary hypothyroidism in 2013.  She was rx'ed with synthroid.  Recent TSH values have been normal, but she had onset 1 month ago of slight weakness of all 4 extremities, and assoc lightheadedness.  She stopped the medication, and sxs resolved.   Past Medical History  Diagnosis Date  . Coronary artery disease      inferior STEMI 01/13/12 s/p PTCA/DES to mid RCA. Initial EF 45% by cath but improved to 55-60% by echo 01/14/12  . GERD (gastroesophageal reflux disease)   . Cocaine abuse     Remote crack cocaine use. UDS negative for cocaine 01/2012  . Dyslipidemia     Trig 201, HDL 36, LDL 197 01/2012  . Abnormal TSH   . History of MI (myocardial infarction) 01/2012  . Alcohol abuse   . Hypothyroidism     Past Surgical History  Procedure Laterality Date  . Cardiac catheterization  2010, 2013    Negative. 2013--100% blockage  . Abdominal hysterectomy      partial    History   Social History  . Marital Status: Single    Spouse Name: N/A    Number of Children: 1  . Years of Education: N/A   Occupational History  . 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 Smoking status: Former Smoker -- 33 years    Types: Cigarettes    Quit date: 06/13/2008  . Smokeless tobacco: Never Used     Comment: None since 2009   . Alcohol Use: No     Comment: former  . Drug Use: No     Comment: Crack cocaine; none since Jan 2006   . Sexual Activity: Yes    Birth Control/ Protection: Surgical   Other Topics Concern  . 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   Enjoys reading, sewing, Fill in puzzles, walking   Completed GED.   No pets.                   Current Outpatient  Prescriptions on File Prior to Visit  Medication Sig Dispense Refill  . aspirin EC 81 MG tablet Take 1 tablet (81 mg total) by mouth daily.  90 tablet  3  . atorvastatin (LIPITOR) 80 MG tablet TAKE ONE TABLET BY MOUTH AT BEDTIME  30 tablet  3  . docusate sodium (COLACE) 100 MG capsule Take 1 capsule (100 mg total) by mouth every 12 (twelve) hours.  60 capsule  0  . nitroGLYCERIN (NITROSTAT) 0.4 MG SL tablet Place 1 tablet (0.4 mg total) under the tongue every 5 (five) minutes x 3 doses as needed for chest pain.  25 tablet  4   No current facility-administered medications on file prior to visit.    Allergies  Allergen Reactions  . Ciprofloxacin     REACTION: nause-sweating-chest tightness    Family History  Problem Relation Age of Onset  . Heart attack Mother 65    prior pacemaker  . Heart disease Mother   . Diabetes Mother   . Heart failure Father     CHF  .  Heart disease Father   . Stroke Brother   thyroid: none  BP 124/84  Pulse 78  Ht 5\' 5"  (1.651 m)  Wt 184 lb (83.462 kg)  BMI 30.62 kg/m2  SpO2 97%    Review of Systems She had nausea.  denies depression, hair loss, sob, weight gain, memory loss, numbness, blurry vision, myalgias, dry skin, rhinorrhea, easy bruising, and syncope.  She has leg cramps.  Constipation is well-controlled.  She attributes easy bruising to plavix (was d/c'ed this am).      Objective:   Physical Exam VS: see vs page GEN: no distress HEAD: head: no deformity eyes: no periorbital swelling, no proptosis external nose and ears are normal mouth: no lesion seen NECK: supple, thyroid is slightly and diffusely enlarged CHEST WALL: no deformity.   LUNGS:  Clear to auscultation.   CV: reg rate and rhythm, no murmur.   ABD: abdomen is soft, nontender.  no hepatosplenomegaly.  not distended.  no hernia.  MUSCULOSKELETAL: muscle bulk and strength are grossly normal.  no obvious joint swelling.  gait is normal and steady.   EXTEMITIES: no  deformity.  no edema.   PULSES: dorsalis pedis intact bilat.  no carotid bruit. NEURO:  cn 2-12 grossly intact.   readily moves all 4's.  sensation is intact to touch on all 4's SKIN:  Normal texture and temperature.  No rash or suspicious lesion is visible.   NODES:  None palpable at the neck.   PSYCH: alert, oriented x3.  Does not appear anxious nor depressed.    Lab Results  Component Value Date   TSH 4.18 06/16/2013      Assessment & Plan:  Hypothyroidism, mild.  It is resolved for now, but she has a high risk of recurrence. Generalized weakness, not thyroid-related, better. Menopause.  This can limits interpretation of sxs.

## 2013-06-16 NOTE — Progress Notes (Signed)
History of Present Illness: 52 yo AAF with history of acute inferior MI 01/12/12 with DES x 1 mid RCA, former substance abuse, hyperlipidemia, hypothyroidism here today for cardiac follow up. She underwent emergent cardiac catheterization 01/12/12 with placement of DES to RCA to the occluded mid RCA. EF 45% on LV-gram with inferior hypokinesis. Echo 01/14/12 with improvement in LVEF to 55%.   She is here today for cardiac follow up. She is doing great. She has had no chest pain, SOB, palpitations, near syncope or syncope. Tolerating all meds. BP has been well controlled at home. She has plans for umbilical hernia repair with Dr. Magnus Ivan.   Primary Care Provider: Sandford Craze   Last Lipid Profile:Lipid Panel     Component Value Date/Time   CHOL 139 04/30/2012 0845   TRIG 134.0 04/30/2012 0845   HDL 43.00 04/30/2012 0845   CHOLHDL 3 04/30/2012 0845   VLDL 26.8 04/30/2012 0845   LDLCALC 69 04/30/2012 0845     Past Medical History  Diagnosis Date  . Coronary artery disease      inferior STEMI 01/13/12 s/p PTCA/DES to mid RCA. Initial EF 45% by cath but improved to 55-60% by echo 01/14/12  . GERD (gastroesophageal reflux disease)   . Cocaine abuse     Remote crack cocaine use. UDS negative for cocaine 01/2012  . Dyslipidemia     Trig 201, HDL 36, LDL 197 01/2012  . Abnormal TSH   . History of MI (myocardial infarction) 01/2012  . Alcohol abuse   . Hypothyroidism     Past Surgical History  Procedure Laterality Date  . Cardiac catheterization  2010, 2013    Negative. 2013--100% blockage  . Abdominal hysterectomy      partial    Current Outpatient Prescriptions  Medication Sig Dispense Refill  . atorvastatin (LIPITOR) 80 MG tablet TAKE ONE TABLET BY MOUTH AT BEDTIME  30 tablet  3  . carvedilol (COREG) 3.125 MG tablet TAKE ONE TABLET BY MOUTH TWICE DAILY  60 tablet  5  . clopidogrel (PLAVIX) 75 MG tablet Take 1 tablet (75 mg total) by mouth daily.  30 tablet  6  . docusate sodium  (COLACE) 100 MG capsule Take 1 capsule (100 mg total) by mouth every 12 (twelve) hours.  60 capsule  0  . nitroGLYCERIN (NITROSTAT) 0.4 MG SL tablet Place 1 tablet (0.4 mg total) under the tongue every 5 (five) minutes x 3 doses as needed for chest pain.  25 tablet  4   No current facility-administered medications for this visit.    Allergies  Allergen Reactions  . Ciprofloxacin     REACTION: nause-sweating-chest tightness    History   Social History  . Marital Status: Single    Spouse Name: N/A    Number of Children: 1  . Years of Education: N/A   Occupational History  . 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 Smoking status: Former Smoker -- 33 years    Types: Cigarettes    Quit date: 06/13/2008  . Smokeless tobacco: Never Used     Comment: None since 2009   . Alcohol Use: No     Comment: former  . Drug Use: No     Comment: Crack cocaine; none since Jan 2006   . Sexual Activity: Yes    Birth Control/ Protection: Surgical   Other Topics Concern  . 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   Enjoys reading, sewing, Fill in puzzles, walking   Completed GED.   No pets.                   Family History  Problem Relation Age of Onset  . Heart attack Mother 35    prior pacemaker  . Heart disease Mother   . Diabetes Mother   . Heart failure Father     CHF  . Heart disease Father   . Stroke Brother     Review of Systems:  As stated in the HPI and otherwise negative.   BP 116/84  Pulse 53  Ht 5\' 6"  (1.676 m)  Wt 184 lb (83.462 kg)  BMI 29.71 kg/m2  SpO2 97%  Physical Examination: General: Well developed, well nourished, NAD HEENT: OP clear, mucus membranes moist SKIN: warm, dry. No rashes. Neuro: No focal deficits Musculoskeletal: Muscle strength 5/5 all  ext Psychiatric: Mood and affect normal Neck: No JVD, no carotid bruits, no thyromegaly, no lymphadenopathy. Lungs:Clear bilaterally, no wheezes, rhonci, crackles Cardiovascular: Regular rate and rhythm. No murmurs, gallops or rubs. Abdomen:Soft. Bowel sounds present. Non-tender.  Extremities: No lower extremity edema. Pulses are 2 + in the bilateral DP/PT.  EKG: Sinus brady, rate 53 bpm. Non-specific T wave abnormality.   Assessment and Plan:   1. CAD: Stable. No chest pains. Continue current medical therapy. No changes today. Lipids well controlled. BP well controlled. Will stop Plavix. She does not need to continue the Plavix post surgery. Continue ASA and Lipitor.   2. Pre-operative risk assessment: She can proceed with planned umbilical hernia repair without ischemic workup. She can hold Plavix for one week before her surgical procedure. Then will not restart.   3. Fatigue: May be secondary to her bradycardia. She thinks her carvedilol may be causing headaches. Will stop carvedilol.

## 2013-06-16 NOTE — Patient Instructions (Addendum)
A thyroid blood test is being requested for you today.  We'll contact you with results. If the result is only slightly off, you don't need to take medication for now. Please come back for a follow-up appointment in 6 months.      Hypothyroidism Hypothyroidism is a condition due to under activity of the thyroid gland. CAUSES  Hypothyroidism may be due to thyroid surgery or disease.  SYMPTOMS  Symptoms are often vague. They may include:  Fatigue.  Mental dullness.  Weakness.  Hoarseness.  Constipation.  Swelling in the face, hands or feet.  Dry skin.  Hair loss.  Sensitivity to cold.  Menstrual problems.  Patients with hypothyroidism are also more likely to have problems with infections. DIAGNOSIS  The diagnosis is confirmed by lab tests for levels of thyroid hormones (TSH, T3, and T4). These tests are also used to monitor treatment. TREATMENT  Treatment includes the gradual replacement of thyroid hormones. It is very important that you take your thyroid medicine as prescribed daily, even after you begin to feel better. You will probably require thyroid medicine for the rest of your life. To reduce constipation, eat a diet high in fruits and vegetables. Try to get some exercise every day. Please see your doctor for follow up care as recommended so your treatment can be adjusted as your condition improves.  SEEK IMMEDIATE MEDICAL CARE IF:  You have chest pain, palpitations, shortness of breath, or any other serious symptoms. Document Released: 11/02/2004 Document Revised: 12/18/2011 Document Reviewed: 09/25/2005 [facility] Patient Information 2014 [place], [place].

## 2013-07-08 ENCOUNTER — Encounter (INDEPENDENT_AMBULATORY_CARE_PROVIDER_SITE_OTHER): Payer: Self-pay | Admitting: General Surgery

## 2013-07-14 ENCOUNTER — Encounter: Payer: Self-pay | Admitting: Family

## 2013-07-14 ENCOUNTER — Ambulatory Visit (INDEPENDENT_AMBULATORY_CARE_PROVIDER_SITE_OTHER): Payer: Managed Care, Other (non HMO) | Admitting: Family

## 2013-07-14 VITALS — BP 122/84 | HR 59 | Temp 97.6°F | Resp 16 | Ht 67.5 in | Wt 184.1 lb

## 2013-07-14 DIAGNOSIS — I2510 Atherosclerotic heart disease of native coronary artery without angina pectoris: Secondary | ICD-10-CM

## 2013-07-14 DIAGNOSIS — IMO0001 Reserved for inherently not codable concepts without codable children: Secondary | ICD-10-CM

## 2013-07-14 DIAGNOSIS — E039 Hypothyroidism, unspecified: Secondary | ICD-10-CM

## 2013-07-14 DIAGNOSIS — K429 Umbilical hernia without obstruction or gangrene: Secondary | ICD-10-CM

## 2013-07-14 DIAGNOSIS — E785 Hyperlipidemia, unspecified: Secondary | ICD-10-CM

## 2013-07-14 DIAGNOSIS — M791 Myalgia, unspecified site: Secondary | ICD-10-CM

## 2013-07-14 LAB — CK: Total CK: 200 U/L — ABNORMAL HIGH (ref 7–177)

## 2013-07-14 MED ORDER — PITAVASTATIN CALCIUM 4 MG PO TABS: 1.0000 | ORAL_TABLET | Freq: Every day | ORAL | Status: DC

## 2013-07-14 NOTE — Assessment & Plan Note (Signed)
Last TSH 4.18.  Monitor.

## 2013-07-14 NOTE — Progress Notes (Signed)
Subjective:    Patient ID: Kaitlyn Bowman, female    DOB: 10-24-1960, 52 y.o.   MRN: [id number]  HPI  Kaitlyn Bowman is a 52 yr old female who presents today for follow up of multiple medical problems.    CAD- off of plavix per cardiology.  Denies CP, SOB or swelling.  Hypothyroid- felt to be minor per endo.  Not currently on synthroid.  Hyperlipidemia-  LDL 69. Reports that she has myalgias in bilateral legs daily. Rates pain 6/10. Started after she began atorvastatin.   She continues to have discomfort from her umbilical hernia. Following with surgery. Plans to schedule hernia repair for some time in January.   Review of Systems    see HPI  Past Medical History  Diagnosis Date  . Coronary artery disease      inferior STEMI 01/13/12 s/p PTCA/DES to mid RCA. Initial EF 45% by cath but improved to 55-60% by echo 01/14/12  . GERD (gastroesophageal reflux disease)   . Cocaine abuse     Remote crack cocaine use. UDS negative for cocaine 01/2012  . Dyslipidemia     Trig 201, HDL 36, LDL 197 01/2012  . Abnormal TSH   . History of MI (myocardial infarction) 01/2012  . Alcohol abuse   . Hypothyroidism     History   Social History  . Marital Status: Single    Spouse Name: N/A    Number of Children: 1  . Years of Education: N/A   Occupational History  . 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 Smoking status: Former Smoker -- 33 years    Types: Cigarettes    Quit date: 06/13/2008  . Smokeless tobacco: Never Used     Comment: None since 2009   . Alcohol Use: No     Comment: former  . Drug Use: No     Comment: Crack cocaine; none since Jan 2006   . Sexual Activity: Yes    Birth Control/ Protection: Surgical   Other Topics Concern  . 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   Enjoys reading,  sewing, Fill in puzzles, walking   Completed GED.   No pets.                   Past Surgical History  Procedure Laterality Date  . Cardiac catheterization  2010, 2013    Negative. 2013--100% blockage  . Abdominal hysterectomy      partial    Family History  Problem Relation Age of Onset  . Heart attack Mother 18    prior pacemaker  . Heart disease Mother   . Diabetes Mother   . Heart failure Father     CHF  . Heart disease Father   . Stroke Brother     Allergies  Allergen Reactions  . Atorvastatin     Myalgia   . Ciprofloxacin     REACTION: nause-sweating-chest tightness    Current Outpatient Prescriptions on File Prior to Visit  Medication Sig Dispense Refill  . aspirin EC 81 MG tablet Take 1 tablet (81 mg total) by mouth daily.  90 tablet  3  . nitroGLYCERIN (NITROSTAT) 0.4 MG SL tablet Place 1 tablet (0.4 mg total) under the  tongue every 5 (five) minutes x 3 doses as needed for chest pain.  25 tablet  4  . docusate sodium (COLACE) 100 MG capsule Take 1 capsule (100 mg total) by mouth every 12 (twelve) hours.  60 capsule  0   No current facility-administered medications on file prior to visit.    BP 122/84  Pulse 59  Temp(Src) 97.6 F (36.4 C) (Oral)  Resp 16  Ht 5' 7.5" (1.715 m)  Wt 184 lb 1.9 oz (83.516 kg)  BMI 28.39 kg/m2  SpO2 96%    Objective:   Physical Exam  Constitutional: She is oriented to person, place, and time. She appears well-developed and well-nourished. No distress.  HENT:  Head: Normocephalic and atraumatic.  Cardiovascular: Normal rate and regular rhythm.   No murmur heard. Pulmonary/Chest: Effort normal and breath sounds normal. No respiratory distress. She has no wheezes. She has no rales. She exhibits no tenderness.  Neurological: She is alert and oriented to person, place, and time.  Skin: Skin is warm.  Psychiatric: She has a normal mood and affect. Her behavior is normal. Judgment and thought content normal.  abd:+  supraumbilical hernia is noted.  Smaller hernia noted beneath umbilicus. Both hernias,mildly tender to the touch.         Assessment & Plan:

## 2013-07-14 NOTE — Assessment & Plan Note (Signed)
She is planning repair in January.

## 2013-07-14 NOTE — Assessment & Plan Note (Signed)
On ASA. Clinically stable.  Management per cardiology.

## 2013-07-14 NOTE — Patient Instructions (Addendum)
Please stop lipitor, start livalo. Call if your muscle pain worsens, or does not improve with switch to livalo.   Follow up in 3 months- come fasting to this appointment.

## 2013-07-14 NOTE — Assessment & Plan Note (Signed)
Not tolerating simvastatin due to myalgia. Will check CK level and switch to Livalo to see if this helps her myalgia. Will need follow up lft/flp in 3 months.

## 2013-07-15 ENCOUNTER — Telehealth: Payer: Self-pay | Admitting: Family

## 2013-07-15 DIAGNOSIS — R748 Abnormal levels of other serum enzymes: Secondary | ICD-10-CM

## 2013-07-15 NOTE — Telephone Encounter (Signed)
Notified pt of lab results and to return for bmp and ck level in 1 week and await further instructions. Lab orders entered.

## 2013-07-15 NOTE — Addendum Note (Signed)
Addended by: Mervin Kung A on: 07/15/2013 11:37 AM   Modules accepted: Orders

## 2013-07-15 NOTE — Telephone Encounter (Signed)
Left message on voicemail to return my call.  

## 2013-07-15 NOTE — Telephone Encounter (Signed)
Please call patient and let her know that her CK level (muscle test) is mildly elevated.  Do not take any cholesterol medication (no atorvastatin, no livalo) for 1 week. Repeat ck level in [redacted] week along with bmet- diagnosis is elevated CK level.

## 2013-07-15 NOTE — Telephone Encounter (Signed)
Message copied by Sandford Craze on Tue Jul 15, 2013  9:04 AM ------      Message from: Danise Edge A      Created: Tue Jul 15, 2013  8:44 AM       Yes, would do Co Q 10 with it, start with just 30m g QOD and check a ck level in 2-4 weeks after starting then titrate up as tolerated      ----- Message -----         From: Sandford Craze, NP         Sent: 07/15/2013   8:01 AM           To: Bradd Canary, MD            This pt c/o myalgia.  CK level mildly elevated at 200.  Holding statin x 1 week and repeating.  Hx of CAD.  I had planned to switch her to livalo.  If CK level normalizes, would you start livalo with close monitoring? thanks       ------

## 2013-07-22 LAB — BASIC METABOLIC PANEL
CO2: 25 mEq/L (ref 19–32)
Chloride: 108 mEq/L (ref 96–112)
Creat: 0.73 mg/dL (ref 0.50–1.10)
Glucose, Bld: 91 mg/dL (ref 70–99)

## 2013-07-24 ENCOUNTER — Telehealth: Payer: Self-pay | Admitting: Family

## 2013-07-24 DIAGNOSIS — E785 Hyperlipidemia, unspecified: Secondary | ICD-10-CM

## 2013-07-24 NOTE — Telephone Encounter (Signed)
Follow up lab work looks good.  Remain off of lipitor.  Start livalo along with Coenzyme Q10 100 mg daily (0tc).  Follow up in 2 weeks for CK level. Dx is elevated ck level.

## 2013-07-25 NOTE — Telephone Encounter (Signed)
Left detailed message on cell# and lab order entered. Advised pt to call if any questions.

## 2013-07-25 NOTE — Telephone Encounter (Signed)
Notified pt. Pt remains concerned about trying another cholesterol medication. Pt wants to have cholesterol checked when she has not had medication to see if it is normal and if she can possibly do without the medication? Pt also states finances are tight and she will not be able to get both medications.  Please advise.

## 2013-07-25 NOTE — Telephone Encounter (Signed)
OK, return fasting in 1 week for flp off of medication. Dx hyperlipidemia.

## 2013-07-25 NOTE — Telephone Encounter (Signed)
Left message for pt to return my call.

## 2013-07-30 LAB — LIPID PANEL
HDL: 43 mg/dL (ref >39)
LDL Cholesterol: 134 mg/dL — ABNORMAL HIGH (ref 0–99)
Triglycerides: 175 mg/dL — ABNORMAL HIGH (ref <150)
VLDL: 35 mg/dL (ref 0–40)

## 2013-08-04 ENCOUNTER — Telehealth: Payer: Self-pay | Admitting: *Deleted

## 2013-08-04 DIAGNOSIS — Z79899 Other long term (current) drug therapy: Secondary | ICD-10-CM

## 2013-08-04 NOTE — Telephone Encounter (Signed)
Message copied by Kathi Simpers on Mon Aug 04, 2013  1:50 PM ------      Message from: O'SULLIVAN, [name]      Created: Wed Jul 30, 2013  7:31 AM       Cholesterol is back up above goal.  I recommend that she start livalo. If she cannot afford coenzyme q10, then just start livalo. Follow up in 2 weeks for CK level.  Call sooner if recurrent muscle pain. ------

## 2013-08-04 NOTE — Telephone Encounter (Signed)
Pt.notified

## 2013-08-11 ENCOUNTER — Telehealth: Payer: Self-pay | Admitting: *Deleted

## 2013-08-11 NOTE — Telephone Encounter (Signed)
Received message from pt on 08/08/13 requesting return call re: headache.  Attempted to reach pt and left detailed message to return my call.

## 2013-08-11 NOTE — Telephone Encounter (Signed)
Pt left message returning my requesting I call her back. Attempted to reach pt and left message on voicemail to return my call. Asked pt to leave detailed information re: current symptoms if she gets voicemail again.

## 2013-08-13 NOTE — Telephone Encounter (Signed)
Pt left message requesting return call re: medications. Attempted to reach pt and left detailed message on cell# to return my call and leave detailed questions/concerns on my voicemail if she isn't able to get through to me when she calls back.

## 2013-08-15 LAB — CK: Total CK: 107 U/L (ref 7–177)

## 2013-08-15 NOTE — Telephone Encounter (Signed)
Spoke with pt and she states muscle cramps have returned in her legs. Pt also states she developed a headache right after starting Livalo. Headache lasted 4 days but states it has almost subsided now. Pt is due to recheck her CK level on 08/19/13.  Please advise.

## 2013-08-15 NOTE — Telephone Encounter (Signed)
Notified pt and she voices understanding. Will have blood drawn today.

## 2013-08-15 NOTE — Telephone Encounter (Signed)
Please see if she can come in today for CK level.  Hold livalo.

## 2013-08-21 ENCOUNTER — Telehealth: Payer: Self-pay | Admitting: Family

## 2013-08-21 MED ORDER — EZETIMIBE 10 MG PO TABS: 10.0000 mg | ORAL_TABLET | Freq: Every day | ORAL | Status: DC

## 2013-08-21 NOTE — Telephone Encounter (Signed)
Pls let pt know that her CK level was normal.  OK to remain off of livalo for now.  I would like her to try zetia instead for her cholesterol.   It is not a statin and should not cause her muscle pain.

## 2013-08-22 NOTE — Telephone Encounter (Signed)
Notified pt and she voices understanding. 

## 2013-08-22 NOTE — Telephone Encounter (Signed)
Left message on voicemail to return my call.  

## 2013-10-13 ENCOUNTER — Ambulatory Visit (INDEPENDENT_AMBULATORY_CARE_PROVIDER_SITE_OTHER): Payer: Managed Care, Other (non HMO) | Admitting: Family

## 2013-10-13 ENCOUNTER — Encounter: Payer: Self-pay | Admitting: Family

## 2013-10-13 VITALS — BP 126/84 | HR 76 | Temp 97.8°F | Resp 16 | Ht 67.5 in | Wt 185.1 lb

## 2013-10-13 DIAGNOSIS — K429 Umbilical hernia without obstruction or gangrene: Secondary | ICD-10-CM

## 2013-10-13 DIAGNOSIS — E039 Hypothyroidism, unspecified: Secondary | ICD-10-CM

## 2013-10-13 DIAGNOSIS — J069 Acute upper respiratory infection, unspecified: Secondary | ICD-10-CM

## 2013-10-13 DIAGNOSIS — I2510 Atherosclerotic heart disease of native coronary artery without angina pectoris: Secondary | ICD-10-CM

## 2013-10-13 DIAGNOSIS — E785 Hyperlipidemia, unspecified: Secondary | ICD-10-CM

## 2013-10-13 NOTE — Patient Instructions (Signed)
Please continue to work hard on low fat/low cholesterol diet. Follow up in 6 months- come fasting to this appointment.

## 2013-10-13 NOTE — Assessment & Plan Note (Signed)
Not currently on meds due to cost and intolerance. Recommend that she continue low fat/low cholesterol diet. Follow up in 6 months, plan flp at that time.

## 2013-10-13 NOTE — Assessment & Plan Note (Signed)
Clinically stable, pt is saving up for elective surgery.

## 2013-10-13 NOTE — Progress Notes (Signed)
Pre visit review using our clinic review tool, if applicable. No additional management support is needed unless otherwise documented below in the visit note. 

## 2013-10-13 NOTE — Progress Notes (Signed)
Subjective:    Patient ID: Kaitlyn Bowman, female    DOB: November 26, 1960, 53 y.o.   MRN: [id number]  HPI  Kaitlyn Bowman is a 53 yr old female who presents today for follow up of multiple medical problems.   1) HTN- Not currently on BP med.   BP Readings from Last 3 Encounters:  10/13/13 126/84  07/14/13 122/84  06/16/13 124/84    2) Hyperlipidemia-off of statin due to myalgia. She was prescribed zetia but reports that she could not afford it.     3) CAD- She remains off plavix per cardiology. She is on aspirin 81mg . Denies CP/SOB or swelling.   4) Hypothyroid- synthroid was discontinued by Dr. Loanne Drilling. She reports feeling well since her levothyroxine was discontinued.    5) umbilical hernia-  She is saving up for her surgery.   6) URI- reports 1 week of nasal congestion.  Improving but not resolved.  Her coworkers have also been sick.  She denies fever. + sneezing, ears itching, body aches.  Reports theraflu has not helped her symptoms.  Review of Systems  Cardiovascular: Negative for chest pain.   See HPI  Past Medical History  Diagnosis Date  . Coronary artery disease      inferior STEMI 01/13/12 s/p PTCA/DES to mid RCA. Initial EF 45% by cath but improved to 55-60% by echo 01/14/12  . GERD (gastroesophageal reflux disease)   . Cocaine abuse     Remote crack cocaine use. UDS negative for cocaine 01/2012  . Dyslipidemia     Trig 201, HDL 36, LDL 197 01/2012  . Abnormal TSH   . History of MI (myocardial infarction) 01/2012  . Alcohol abuse   . Hypothyroidism     History   Social History  . Marital Status: Single    Spouse Name: N/A    Number of Children: 1  . Years of Education: N/A   Occupational History  . 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 Smoking status: Former Smoker -- 33 years    Types: Cigarettes    Quit date: 06/13/2008  . Smokeless tobacco: Never Used     Comment: None since 2009   . Alcohol Use: No     Comment: former  . Drug Use: No   Comment: Crack cocaine; none since Jan 2006   . Sexual Activity: Yes    Birth Control/ Protection: Surgical   Other Topics Concern  . 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   Enjoys reading, sewing, Fill in puzzles, walking   Completed GED.   No pets.                   Past Surgical History  Procedure Laterality Date  . Cardiac catheterization  2010, 2013    Negative. 2013--100% blockage  . Abdominal hysterectomy      partial    Family History  Problem Relation Age of Onset  . Heart attack Mother 45    prior pacemaker  . Heart disease Mother   . Diabetes Mother   . Heart failure Father     CHF  . Heart disease Father   . Stroke Brother     Allergies  Allergen Reactions  .  Atorvastatin     Myalgia   . Ciprofloxacin     REACTION: nause-sweating-chest tightness  . Livalo [Pitavastatin]     myalgia    Current Outpatient Prescriptions on File Prior to Visit  Medication Sig Dispense Refill  . aspirin EC 81 MG tablet Take 1 tablet (81 mg total) by mouth daily.  90 tablet  3  . Coenzyme Q10 ([facility] COENZYME Q10) 100 MG capsule Take 100 mg by mouth daily.      Marland Kitchen docusate sodium (COLACE) 100 MG capsule Take 1 capsule (100 mg total) by mouth every 12 (twelve) hours.  60 capsule  0  . ezetimibe (ZETIA) 10 MG tablet Take 1 tablet (10 mg total) by mouth daily.  30 tablet  3  . nitroGLYCERIN (NITROSTAT) 0.4 MG SL tablet Place 1 tablet (0.4 mg total) under the tongue every 5 (five) minutes x 3 doses as needed for chest pain.  25 tablet  4   No current facility-administered medications on file prior to visit.    BP 126/84  Pulse 76  Temp(Src) 97.8 F (36.6 C) (Oral)  Resp 16  Ht 5' 7.5" (1.715 m)  Wt 185 lb 1.3 oz (83.952 kg)  BMI 28.54 kg/m2  SpO2 98%       Objective:   Physical Exam    Constitutional: She is oriented to person, place, and time. She appears well-developed and well-nourished.  HENT:  Head: Normocephalic and atraumatic.  Right Ear: Tympanic membrane and ear canal normal.  Left Ear: Tympanic membrane and ear canal normal.  Mouth/Throat: No oropharyngeal exudate, posterior oropharyngeal edema or posterior oropharyngeal erythema.  Cardiovascular: Normal rate and regular rhythm.   No murmur heard. Pulmonary/Chest: Effort normal and breath sounds normal. No respiratory distress. She has no wheezes. She has no rales. She exhibits no tenderness.  Musculoskeletal: She exhibits no edema.  Lymphadenopathy:    She has no cervical adenopathy.  Neurological: She is alert and oriented to person, place, and time.  Skin: Skin is warm and dry.  Psychiatric: She has a normal mood and affect. Her behavior is normal. Judgment and thought content normal.          Assessment & Plan:

## 2013-10-13 NOTE — Assessment & Plan Note (Signed)
Clinically stable, continue aspirin.

## 2013-10-13 NOTE — Assessment & Plan Note (Signed)
Off synthroid per endo- advised pt to follow up with Dr. Loanne Drilling in March.

## 2013-10-13 NOTE — Assessment & Plan Note (Signed)
Resolving, advised pt to let me know if symptoms don't resolve in 3 more days.

## 2014-01-31 ENCOUNTER — Encounter (HOSPITAL_BASED_OUTPATIENT_CLINIC_OR_DEPARTMENT_OTHER): Payer: Self-pay | Admitting: Emergency Medicine

## 2014-01-31 ENCOUNTER — Emergency Department (HOSPITAL_BASED_OUTPATIENT_CLINIC_OR_DEPARTMENT_OTHER)
Admission: EM | Admit: 2014-01-31 | Discharge: 2014-01-31 | Disposition: A | Discharge status: Home / Self Care | Payer: Managed Care, Other (non HMO) | Attending: Emergency Medicine | Admitting: Emergency Medicine

## 2014-01-31 DIAGNOSIS — Z7982 Long term (current) use of aspirin: Secondary | ICD-10-CM | POA: Insufficient documentation

## 2014-01-31 DIAGNOSIS — I252 Old myocardial infarction: Secondary | ICD-10-CM | POA: Insufficient documentation

## 2014-01-31 DIAGNOSIS — Z8719 Personal history of other diseases of the digestive system: Secondary | ICD-10-CM | POA: Insufficient documentation

## 2014-01-31 DIAGNOSIS — M5412 Radiculopathy, cervical region: Secondary | ICD-10-CM | POA: Insufficient documentation

## 2014-01-31 DIAGNOSIS — Z9889 Other specified postprocedural states: Secondary | ICD-10-CM | POA: Insufficient documentation

## 2014-01-31 DIAGNOSIS — E785 Hyperlipidemia, unspecified: Secondary | ICD-10-CM | POA: Insufficient documentation

## 2014-01-31 DIAGNOSIS — M542 Cervicalgia: Secondary | ICD-10-CM | POA: Insufficient documentation

## 2014-01-31 DIAGNOSIS — Z87891 Personal history of nicotine dependence: Secondary | ICD-10-CM | POA: Insufficient documentation

## 2014-01-31 DIAGNOSIS — IMO0001 Reserved for inherently not codable concepts without codable children: Secondary | ICD-10-CM | POA: Insufficient documentation

## 2014-01-31 DIAGNOSIS — I2510 Atherosclerotic heart disease of native coronary artery without angina pectoris: Secondary | ICD-10-CM | POA: Insufficient documentation

## 2014-01-31 DIAGNOSIS — Z79899 Other long term (current) drug therapy: Secondary | ICD-10-CM | POA: Insufficient documentation

## 2014-01-31 MED ORDER — DEXAMETHASONE SODIUM PHOSPHATE 10 MG/ML IJ SOLN
10.0000 mg | Freq: Once | INTRAMUSCULAR | Status: AC
  Administered 2014-01-31: 10 mg via INTRAMUSCULAR
  Filled 2014-01-31: qty 1

## 2014-01-31 MED ORDER — TRAMADOL HCL 50 MG PO TABS: 50.0000 mg | ORAL_TABLET | Freq: Four times a day (QID) | ORAL | Status: DC | PRN

## 2014-01-31 MED ORDER — ONDANSETRON 4 MG PO TBDP: 4.0000 mg | ORAL_TABLET | Freq: Once | ORAL | Status: DC

## 2014-01-31 MED ORDER — MORPHINE SULFATE 4 MG/ML IJ SOLN
8.0000 mg | Freq: Once | INTRAMUSCULAR | Status: DC
Start: 2014-01-31 — End: 2014-01-31

## 2014-01-31 NOTE — ED Notes (Signed)
Reports left arm and axilla pain- sharp- x 3 days- pt tearful- reports pain in neck also- states she had heart attack 2 years with stent placement

## 2014-01-31 NOTE — ED Provider Notes (Addendum)
CSN: [id number]     Arrival date & time 01/31/14  1537 History  This chart was scribed for Kaitlyn Johns, MD by Marcha Dutton, ED Scribe. This patient was seen in room MH03/MH03 and the patient's care was started at 4:08 PM.    Chief Complaint  Patient presents with  . Arm Pain     The history is provided by the patient. No language interpreter was used.   HPI Comments: Kaitlyn Bowman is a 53 y.o. female with a h/o CAD, MI and a surgical h/o of cardiac catheterization 4/13 with stent placement who presents to the Emergency Department complaining of left arm and axilla pain that began 10 days ago. She states her pain is constant, gradually worsening and begins in her neck, goes down her shoulder, and ends in her upper back. She reports her pain in her axilla has been intermittent and gradually worsening and becoming sharp over the last three days to the point where she couldn't sleep last night. Pt reports associated head ache in her forehead and numbness in her entire left hand that is intermittent. Pt denies SOB, CP, nausea, vomiting, sweats, fevers, and cough. She denies taking plavix anymore. She states she just takes aspirin.She reports the last time she had a MI it felt like an "elephant was sitting on her chest" and that her current pain is different. She states her current pain is exacerbated with movement. She reports a h/o similar pain with a pulled muscle. Pt reports a h/o hernia. She also states her job has been stressing her out and she's looking for a new one. Pt reports she's concerned about taking muscle relaxers and says she'd like something that to help her sleep. Pt denies use of illegal drugs currently.  Has hx of cocaine/alcohol abuse.  Past Medical History  Diagnosis Date  . Coronary artery disease      inferior STEMI 01/13/12 s/p PTCA/DES to mid RCA. Initial EF 45% by cath but improved to 55-60% by echo 01/14/12  . GERD (gastroesophageal reflux disease)   . Cocaine abuse      Remote crack cocaine use. UDS negative for cocaine 01/2012  . Dyslipidemia     Trig 201, HDL 36, LDL 197 01/2012  . Abnormal TSH   . History of MI (myocardial infarction) 01/2012  . Alcohol abuse   . Hypothyroidism    Past Surgical History  Procedure Laterality Date  . Cardiac catheterization  2010, 2013    Negative. 2013--100% blockage  . Abdominal hysterectomy      partial   Family History  Problem Relation Age of Onset  . Heart attack Mother 83    prior pacemaker  . Heart disease Mother   . Diabetes Mother   . Heart failure Father     CHF  . Heart disease Father   . Stroke Brother    History  Substance Use Topics  . Smoking status: Former Smoker -- 33 years    Types: Cigarettes    Quit date: 06/13/2008  . Smokeless tobacco: Never Used     Comment: None since 2009   . Alcohol Use: No     Comment: former   OB History   Grav Para Term Preterm Abortions TAB SAB Ect Mult Living                 Review of Systems  Constitutional: Negative for fever, chills, diaphoresis and fatigue.  HENT: Negative for congestion, rhinorrhea and sneezing.   Eyes: Negative.  Respiratory: Negative for cough, chest tightness and shortness of breath.   Cardiovascular: Negative for chest pain and leg swelling.  Gastrointestinal: Negative for nausea, vomiting, abdominal pain, diarrhea and blood in stool.  Genitourinary: Negative for frequency, hematuria, flank pain and difficulty urinating.  Musculoskeletal: Positive for arthralgias (Left shoulder and arm), myalgias and neck pain. Negative for back pain.  Skin: Negative for rash.  Neurological: Negative for dizziness, speech difficulty, weakness, numbness and headaches.      Allergies  Atorvastatin; Ciprofloxacin; and Livalo  Home Medications   Prior to Admission medications   Medication Sig Start Date End Date Taking? Authorizing Provider  aspirin EC 81 MG tablet Take 1 tablet (81 mg total) by mouth daily. 06/16/13  Yes Burnell Blanks, MD  nitroGLYCERIN (NITROSTAT) 0.4 MG SL tablet Place 1 tablet (0.4 mg total) under the tongue every 5 (five) minutes x 3 doses as needed for chest pain. 01/15/12  Yes Dayna N Dunn, PA-C  Coenzyme Q10 ([employer]S COENZYME Q10) 100 MG capsule Take 100 mg by mouth daily.    Historical Provider, MD  docusate sodium (COLACE) 100 MG capsule Take 1 capsule (100 mg total) by mouth every 12 (twelve) hours. 03/28/13   Julianne Rice, MD  ezetimibe (ZETIA) 10 MG tablet Take 1 tablet (10 mg total) by mouth daily. 08/21/13   [name], NP   Triage Vitals: BP 138/72  Pulse 68  Temp(Src) 98 F (36.7 C) (Oral)  Resp 18  Ht 5\' 6"  (1.676 m)  Wt 182 lb (82.555 kg)  BMI 29.39 kg/m2  SpO2 100%  Physical Exam  Nursing note and vitals reviewed. Constitutional: She is oriented to person, place, and time. She appears well-developed and well-nourished.  HENT:  Head: Normocephalic and atraumatic.  Eyes: Pupils are equal, round, and reactive to light.  Neck: Normal range of motion. Neck supple.  Cardiovascular: Normal rate, regular rhythm, normal heart sounds and intact distal pulses.   Pulmonary/Chest: Effort normal and breath sounds normal. No respiratory distress. She has no wheezes. She has no rales. She exhibits no tenderness.  Abdominal: Soft. Bowel sounds are normal. There is no tenderness. There is no rebound and no guarding.  Musculoskeletal: Normal range of motion. She exhibits no edema.       Right shoulder: She exhibits tenderness (left paracervical area and left trapezius and latissimus dorsi).  Normal sensation and motor function in the left hand,   Lymphadenopathy:    She has no cervical adenopathy.  Neurological: She is alert and oriented to person, place, and time.  Skin: Skin is warm and dry. No rash noted.  Psychiatric: She has a normal mood and affect.    ED Course  Procedures (including critical care time)  DIAGNOSTIC STUDIES: Oxygen Saturation is 100% on RA, normal by my  interpretation.    COORDINATION OF CARE: 4:13 PM- Pt advised of plan for treatment and pt agrees.    Labs Review Labs Reviewed - No data to display  Imaging Review No results found.   EKG Interpretation None      Date: 01/31/2014  Rate: 65  Rhythm: normal sinus rhythm  QRS Axis: normal  Intervals: normal  ST/T Wave abnormalities: normal  Conduction Disutrbances:none  Narrative Interpretation:   Old EKG Reviewed: none available, unchanged    MDM   Final diagnoses:  Cervical radiculopathy    Pt presents with radicular pain from left cervical area.  Worse with palpation and ROM of upper extremitiy.  No symptoms that sound concerning  for angina.  Will tx with decadron, ultram. Pt does not want muscle relaxers.  Will f/u with her PMD if symptoms not improving.  I personally performed the services described in this documentation, which was scribed in my presence.  The recorded information has been reviewed and considered.    Kaitlyn Johns, MD 01/31/14 3825  Kaitlyn Johns, MD 01/31/14 2355

## 2014-01-31 NOTE — Discharge Instructions (Signed)
Cervical Radiculopathy  Cervical radiculopathy happens when a nerve in the neck is pinched or bruised by a slipped (herniated) disk or by arthritic changes in the bones of the cervical spine. This can occur due to an injury or as part of the normal aging process. Pressure on the cervical nerves can cause pain or numbness that runs from your neck all the way down into your arm and fingers.  CAUSES   There are many possible causes, including:  · Injury.  · Muscle tightness in the neck from overuse.  · Swollen, painful joints (arthritis).  · Breakdown or degeneration in the bones and joints of the spine (spondylosis) due to aging.  · Bone spurs that may develop near the cervical nerves.  SYMPTOMS   Symptoms include pain, weakness, or numbness in the affected arm and hand. Pain can be severe or irritating. Symptoms may be worse when extending or turning the neck.  DIAGNOSIS   Your caregiver will ask about your symptoms and do a physical exam. He or she may test your strength and reflexes. X-rays, CT scans, and MRI scans may be needed in cases of injury or if the symptoms do not go away after a period of time. Electromyography (EMG) or nerve conduction testing may be done to study how your nerves and muscles are working.  TREATMENT   Your caregiver may recommend certain exercises to help relieve your symptoms. Cervical radiculopathy can, and often does, get better with time and treatment. If your problems continue, treatment options may include:  · Wearing a soft collar for short periods of time.  · Physical therapy to strengthen the neck muscles.  · Medicines, such as nonsteroidal anti-inflammatory drugs (NSAIDs), oral corticosteroids, or spinal injections.  · Surgery. Different types of surgery may be done depending on the cause of your problems.  HOME CARE INSTRUCTIONS   · Put ice on the affected area.  · Put ice in a plastic bag.  · Place a towel between your skin and the bag.  · Leave the ice on for 15-20 minutes,  03-04 times a day or as directed by your caregiver.  · If ice does not help, you can try using heat. Take a warm shower or bath, or use a hot water bottle as directed by your caregiver.  · You may try a gentle neck and shoulder massage.  · Use a flat pillow when you sleep.  · Only take over-the-counter or prescription medicines for pain, discomfort, or fever as directed by your caregiver.  · If physical therapy was prescribed, follow your caregiver's directions.  · If a soft collar was prescribed, use it as directed.  SEEK IMMEDIATE MEDICAL CARE IF:   · Your pain gets much worse and cannot be controlled with medicines.  · You have weakness or numbness in your hand, arm, face, or leg.  · You have a high fever or a stiff, rigid neck.  · You lose bowel or bladder control (incontinence).  · You have trouble with walking, balance, or speaking.  MAKE SURE YOU:   · Understand these instructions.  · Will watch your condition.  · Will get help right away if you are not doing well or get worse.  Document Released: 06/20/2001 Document Revised: 12/18/2011 Document Reviewed: 05/09/2011  Exit[employer]® Patient Information ©2014 [employer].

## 2014-02-04 ENCOUNTER — Telehealth: Payer: Self-pay | Admitting: Cardiovascular Disease

## 2014-02-04 NOTE — Telephone Encounter (Signed)
I spoke with the pt and she felt like she strained a muscle a week ago.  The pt was having numbness in her left arm and chest discomfort on Saturday so she went to the ER for evaluation. They felt like symptoms were related to a pinched nerve in neck.  The pt said they gave her a steriod injection and Ultram and this helped Sunday and Monday. Yesterday the pt received a call about a close friend who died suddenly from a heart attack and since then she has been worried more about her symptoms. The pt has also been under a lot of stress at work.  The pt denies SOB but does complain of left sided chest discomfort that is behind the breast.  I attempted to arrange an appointment with the PA today but the pt cannot leave work.  The pt prefers to see Dr Angelena Form and her day off is Monday.  I have scheduled the pt to be seen on 02/09/14 with Dr Angelena Form.  The pt was advised to return to the ER for further evaluation of symptoms if they worsen. Pt agreed with plan.

## 2014-02-04 NOTE — Telephone Encounter (Signed)
New message     Went to ER sat for chest pain/arm pain.  Pt was told it may be a pinched nerve.  Today arm is "kinda" numb----pt is having chest discomfort now

## 2014-02-09 ENCOUNTER — Ambulatory Visit (INDEPENDENT_AMBULATORY_CARE_PROVIDER_SITE_OTHER): Payer: Managed Care, Other (non HMO) | Admitting: Cardiovascular Disease

## 2014-02-09 ENCOUNTER — Encounter: Payer: Self-pay | Admitting: Cardiovascular Disease

## 2014-02-09 VITALS — BP 132/78 | HR 64 | Ht 66.0 in | Wt 190.4 lb

## 2014-02-09 DIAGNOSIS — R079 Chest pain, unspecified: Secondary | ICD-10-CM

## 2014-02-09 DIAGNOSIS — I2510 Atherosclerotic heart disease of native coronary artery without angina pectoris: Secondary | ICD-10-CM

## 2014-02-09 NOTE — Patient Instructions (Signed)
Your physician wants you to follow-up in:  6 months. You will receive a reminder letter in the mail two months in advance. If you don't receive a letter, please call our office to schedule the follow-up appointment.   

## 2014-02-09 NOTE — Progress Notes (Signed)
History of Present Illness: 53 yo AAF with history of acute inferior MI 01/12/12 with DES x 1 mid RCA, former substance abuse, hyperlipidemia, hypothyroidism here today for cardiac follow up. She underwent emergent cardiac catheterization 01/12/12 with placement of DES to RCA to the occluded mid RCA. EF 45% on LV-gram with inferior hypokinesis. Echo 01/14/12 with improvement in LVEF to 55%.    She is here today for cardiac follow up. She was seen in the ED 01/31/14 for left shoulder and arm pain. The pain started after recent manual labor. Localized to left shoulder, some numbness in left arm. No exertional chest pain. No SOB. The pain is clearly exacerbated by movement of her arm.   Primary Care Provider: [name]   Last Lipid Profile:Lipid Panel     Component Value Date/Time   CHOL 212* 07/29/2013 1123   TRIG 175* 07/29/2013 1123   HDL 43 07/29/2013 1123   CHOLHDL 4.9 07/29/2013 1123   VLDL 35 07/29/2013 1123   LDLCALC 134* 07/29/2013 1123     Past Medical History  Diagnosis Date  . Coronary artery disease      inferior STEMI 01/13/12 s/p PTCA/DES to mid RCA. Initial EF 45% by cath but improved to 55-60% by echo 01/14/12  . GERD (gastroesophageal reflux disease)   . Cocaine abuse     Remote crack cocaine use. UDS negative for cocaine 01/2012  . Dyslipidemia     Trig 201, HDL 36, LDL 197 01/2012  . Abnormal TSH   . History of MI (myocardial infarction) 01/2012  . Alcohol abuse   . Hypothyroidism     Past Surgical History  Procedure Laterality Date  . Cardiac catheterization  2010, 2013    Negative. 2013--100% blockage  . Abdominal hysterectomy      partial    Current Outpatient Prescriptions  Medication Sig Dispense Refill  . aspirin EC 81 MG tablet Take 1 tablet (81 mg total) by mouth daily.  90 tablet  3  . Cromolyn Sodium (NASAL ALLERGY NA) Place into the nose as needed.      . nitroGLYCERIN (NITROSTAT) 0.4 MG SL tablet Place 1 tablet (0.4 mg total) under the  tongue every 5 (five) minutes x 3 doses as needed for chest pain.  25 tablet  4   No current facility-administered medications for this visit.    Allergies  Allergen Reactions  . Atorvastatin     Myalgia   . Ciprofloxacin     REACTION: nause-sweating-chest tightness  . Livalo [Pitavastatin]     myalgia    History   Social History  . Marital Status: Single    Spouse Name: N/A    Number of Children: 1  . Years of Education: N/A   Occupational History  . 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 Smoking status: Former Smoker -- 33 years    Types: Cigarettes    Quit date: 06/13/2008  . Smokeless tobacco: Never Used     Comment: None since 2009   . Alcohol Use: No     Comment: former  . Drug Use: No     Comment: Crack cocaine; none since Jan 2006   . Sexual Activity: Yes    Birth Control/ Protection: Surgical   Other Topics Concern  . Not on file   Social History Narrative   Regular exercise:  Walks 5-7 x weekly   Caffeine use:  1 daily   Manager on duty at [employer]  Single   Daughter age 67 and 75 yr old grandson- daughter lives in [place].   Enjoys reading, sewing, Fill in puzzles, walking   Completed GED.   No pets.                   Family History  Problem Relation Age of Onset  . Heart attack Mother 4    prior pacemaker  . Heart disease Mother   . Diabetes Mother   . Heart failure Father     CHF  . Heart disease Father   . Stroke Brother     Review of Systems:  As stated in the HPI and otherwise negative.   BP 132/78  Pulse 64  Ht 5\' 6"  (1.676 m)  Wt 190 lb 6.4 oz (86.365 kg)  BMI 30.75 kg/m2  Physical Examination: General: Well developed, well nourished, NAD HEENT: OP clear, mucus membranes moist SKIN: warm, dry. No rashes. Neuro: No focal deficits Musculoskeletal: Muscle strength 5/5 all ext Psychiatric: Mood and affect normal Neck: No JVD, no carotid bruits, no thyromegaly, no lymphadenopathy. Lungs:Clear  bilaterally, no wheezes, rhonci, crackles Cardiovascular: Regular rate and rhythm. No murmurs, gallops or rubs. Abdomen:Soft. Bowel sounds present. Non-tender.  Extremities: No lower extremity edema. Pulses are 2 + in the bilateral DP/PT.  EKG: NSR, rate 62 bpm. Non-specific T wave abnormality.   Cardiac cath 01/12/12: Left main: No obstructive disease.  Left Anterior Descending Artery: Large vessel that courses to the apex. Mild luminal irregularities in the mid vessel. Moderate sized diagonal branch with no disease.  Circumflex Artery: Large caliber vessel with serial 20% lesions in the mid vessel. OM1 is moderate sized with no disease. The second and third marginals are small. The fourth OM branch is moderate sized and has a 30% proximal stenosis.  Right Coronary Artery: Dominant vessel with 100% mid occlusion.  Left Ventricular Angiogram: LVEF 45%. Inferior wall hypokinesis.    Assessment and Plan:   1. CAD: Stable. Her chest pain seems to be musculoskeletal. She is only taking ASA. She does not tolerate statins due to muscle aches or beta blockers due to fatigue.   2. Chest pain: as above. Does not appear to be cardiac related.

## 2014-04-13 ENCOUNTER — Ambulatory Visit (INDEPENDENT_AMBULATORY_CARE_PROVIDER_SITE_OTHER): Payer: Managed Care, Other (non HMO) | Admitting: Family

## 2014-04-13 ENCOUNTER — Encounter: Payer: Self-pay | Admitting: Family

## 2014-04-13 VITALS — BP 142/80 | HR 62 | Temp 98.1°F | Resp 16 | Ht 67.5 in | Wt 191.0 lb

## 2014-04-13 DIAGNOSIS — E785 Hyperlipidemia, unspecified: Secondary | ICD-10-CM

## 2014-04-13 DIAGNOSIS — I2584 Coronary atherosclerosis due to calcified coronary lesion: Secondary | ICD-10-CM

## 2014-04-13 DIAGNOSIS — I2510 Atherosclerotic heart disease of native coronary artery without angina pectoris: Secondary | ICD-10-CM

## 2014-04-13 DIAGNOSIS — K429 Umbilical hernia without obstruction or gangrene: Secondary | ICD-10-CM

## 2014-04-13 LAB — LIPID PANEL
CHOL/HDL RATIO: 7.7 ratio
Cholesterol: 284 mg/dL — ABNORMAL HIGH (ref 0–200)
HDL: 37 mg/dL — AB (ref >39)
LDL CALC: 210 mg/dL — AB (ref 0–99)
Triglycerides: 185 mg/dL — ABNORMAL HIGH (ref <150)
VLDL: 37 mg/dL (ref 0–40)

## 2014-04-13 LAB — HEPATIC FUNCTION PANEL
ALK PHOS: 65 U/L (ref 39–117)
ALT: 15 U/L (ref 0–35)
AST: 18 U/L (ref 0–37)
Albumin: 4.2 g/dL (ref 3.5–5.2)
BILIRUBIN TOTAL: 0.3 mg/dL (ref 0.2–1.2)
Bilirubin, Direct: <0.1 mg/dL (ref 0.0–0.3)
Total Protein: 7.3 g/dL (ref 6.0–8.3)

## 2014-04-13 NOTE — Assessment & Plan Note (Signed)
We discussed strict low cholesterol diet.  Will plan to repeat flp in 3 months.

## 2014-04-13 NOTE — Assessment & Plan Note (Signed)
Unchanged. She is trying to save money for surgical copay.

## 2014-04-13 NOTE — Progress Notes (Signed)
Subjective:    Patient ID: Kaitlyn Bowman, female    DOB: 07-04-1961, 53 y.o.   MRN: [id number]  HPI   [name] is a 53 yr old female who presents today for follow up.  Moderate level ha now. 5/10. Was 7/10 when arrrived. She has not eaten and worked 7 days straight(Moderate long shifts). Some stress. No chest pain. No neuro signs or symptoms. Her ha is decreasing as we speak.  By end of exam headache resolved.   Hyperlipidemia- Lipitor in past caused myalgias and could not afford zetia. Moderate compliant on low cholesterol dieting and exercisiing.  CAD- Pt not on plavix per cardiology. Pt taking 81 mg aspirin q day.  Umbilical hernia- occasional hurts but not severe. Pt she is saving up for surgery.  No nausea, no vomiting. No cramping. Normal appetite.  No current pain today except faintly on direct palpation.  HA- moderate intially. No associated neuroligic signs or symptoms. By end of exam. Ha resolved completley.    Review of Systems  Constitutional: Negative for fever and fatigue.  Respiratory: Negative for apnea and shortness of breath.   Cardiovascular: Negative for chest pain, palpitations and leg swelling.  Gastrointestinal: Positive for vomiting and abdominal pain. Negative for nausea, constipation, abdominal distention and anal bleeding.       Mild occasional transient around umbilical area. Depends on postion.  Neurological: Positive for headaches. Negative for dizziness, tremors, syncope, facial asymmetry, speech difficulty, weakness, light-headedness and numbness.       Innitialy on exam but then resolved by end.      Past Medical History  Diagnosis Date  . Coronary artery disease      inferior STEMI 01/13/12 s/p PTCA/DES to mid RCA. Initial EF 45% by cath but improved to 55-60% by echo 01/14/12  . GERD (gastroesophageal reflux disease)   . Cocaine abuse     Remote crack cocaine use. UDS negative for cocaine 01/2012  . Dyslipidemia     Trig 201, HDL 36, LDL 197  01/2012  . Abnormal TSH   . History of MI (myocardial infarction) 01/2012  . Alcohol abuse   . Hypothyroidism     History   Social History  . Marital Status: Single    Spouse Name: N/A    Number of Children: 1  . Years of Education: N/A   Occupational History  . 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 Smoking status: Former Smoker -- 33 years    Types: Cigarettes    Quit date: 06/13/2008  . Smokeless tobacco: Never Used     Comment: None since 2009   . Alcohol Use: No     Comment: former  . Drug Use: No     Comment: Crack cocaine; none since Jan 2006   . Sexual Activity: Yes    Birth Control/ Protection: Surgical   Other Topics Concern  . 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   Enjoys reading, sewing, Fill in puzzles, walking   Completed GED.   No pets.                   Past Surgical History  Procedure Laterality Date  . Cardiac catheterization  2010, 2013  Negative. 2013--100% blockage  . Abdominal hysterectomy      partial    Family History  Problem Relation Age of Onset  . Heart attack Mother 47    prior pacemaker  . Heart disease Mother   . Diabetes Mother   . Heart failure Father     CHF  . Heart disease Father   . Stroke Brother     Allergies  Allergen Reactions  . Atorvastatin     Myalgia   . Ciprofloxacin     REACTION: nause-sweating-chest tightness  . Livalo [Pitavastatin]     myalgia    Current Outpatient Prescriptions on File Prior to Visit  Medication Sig Dispense Refill  . aspirin EC 81 MG tablet Take 1 tablet (81 mg total) by mouth daily.  90 tablet  3  . Cromolyn Sodium (NASAL ALLERGY NA) Place into the nose as needed.      . nitroGLYCERIN (NITROSTAT) 0.4 MG SL tablet Place 1 tablet (0.4 mg total) under the tongue every 5 (five)  minutes x 3 doses as needed for chest pain.  25 tablet  4   No current facility-administered medications on file prior to visit.    BP 142/80  Pulse 62  Temp(Src) 98.1 F (36.7 C) (Oral)  Resp 16  Ht 5' 7.5" (1.715 m)  Wt 191 lb 0.6 oz (86.655 kg)  BMI 29.46 kg/m2  SpO2 99%  Provider check bp today 130/80(ESPAC)       Objective:   Physical Exam  General Mental Status- Alert. General Appearance- Not in acute distress.   Skin General: Color- Normal Color. Moisture- Normal Moisture.  Neck Carotid Arteries- Normal color. Moisture- Normal Moisture. No carotid bruits. No JVD.  Chest and Lung Exam Auscultation: Breath Sounds:-Normal.  Cardiovascular Auscultation:Rythm- Regular. Murmurs & Other Heart Sounds:Auscultation of the heart reveals- No Murmurs.  Abdomen Inspection:-Inspeection Normal. Palpation/Percussion:Note:No mass. Palpation and Percussion of the abdomen reveal small bulge below umbilicus mild tender. On straight leg lift slight bulge between rectus abdominal muscles.   Neurologic Cranial Nerve exam:- CN III-XII intact(No nystagmus), symmetric smile. Drift Test:- No drift. Romberg Exam:- Negative.  Finger to Nose:- Normal/Intact Strength:- 5/5 equal and symmetric strength both upper and lower extremities.       Assessment & Plan:  Patient seen along with Evern Core PA-C who is currently in orientation for training purposes. I have personally examined pt and agree with assessment and plan.

## 2014-04-13 NOTE — Assessment & Plan Note (Signed)
Clinically stable on aspirin 81mg .

## 2014-04-13 NOTE — Patient Instructions (Signed)
Please complete lab work prior to leaving. Follow up in 3 months.  

## 2014-04-13 NOTE — Progress Notes (Signed)
Pre visit review using our clinic review tool, if applicable. No additional management support is needed unless otherwise documented below in the visit note.,h  

## 2014-04-14 ENCOUNTER — Telehealth: Payer: Self-pay | Admitting: Family

## 2014-04-14 DIAGNOSIS — E785 Hyperlipidemia, unspecified: Secondary | ICD-10-CM

## 2014-04-14 MED ORDER — EZETIMIBE 10 MG PO TABS: 10.0000 mg | ORAL_TABLET | Freq: Every day | ORAL | Status: DC

## 2014-04-14 NOTE — Telephone Encounter (Signed)
Please let pt know that her cholesterol is much higher. LDL 210, should be <100. Was 71 when she was on statin.  Try zetia along with strict low fat/low cholesterol diet.  Repeat flp/lft in 2 months.

## 2014-04-15 NOTE — Telephone Encounter (Signed)
We can have her try pravastatin 80 mg once daily #30 1 refill. (Repeat flp/lft in 6 weeks) This is one of the better tolerated statins and is less expensive.  Call me if she develops muscle pain while on this medication.

## 2014-04-15 NOTE — Telephone Encounter (Signed)
Patient states that zetia is too expensive for her and would like to know if there is anything else that she could take. She states that lipitor made her sick. She would also like to know her labs results. Patient states that it is okay to leave a detailed message.

## 2014-04-17 NOTE — Telephone Encounter (Signed)
Left message for pt to return my call.

## 2014-04-21 NOTE — Telephone Encounter (Signed)
Left message to return my call.  

## 2014-04-28 ENCOUNTER — Telehealth: Payer: Self-pay

## 2014-04-28 MED ORDER — PRAVASTATIN SODIUM 80 MG PO TABS: 80.0000 mg | ORAL_TABLET | Freq: Every day | ORAL | Status: DC

## 2014-04-28 NOTE — Telephone Encounter (Signed)
Pt contacted office wanting test results from previous visit.Pt stated plz contact

## 2014-04-28 NOTE — Telephone Encounter (Signed)
Left detailed message on pt's cell# and entered lab order.

## 2014-04-29 NOTE — Telephone Encounter (Signed)
Patient returned phone call. °

## 2014-04-29 NOTE — Telephone Encounter (Signed)
Had previously left detailed message on pt's voicemail re: results on 04/28/14. Left message to return my call.

## 2014-05-01 NOTE — Telephone Encounter (Signed)
Spoke with pt. States she has had a headache x 2 days and felt dizzy today. Offered pt appt this afternoon and she declines as she is still at work. Pt wanted me to schedule appt for Monday and I advised her that she should not wait until Monday due to current symptoms. Offered pt appt for Saturday clinic at 12pm due to her work schedule tomorrow and pt declines stating she will just go to the ER this evening. Pt declined other urgent care options as well.

## 2014-05-02 ENCOUNTER — Ambulatory Visit: Payer: Managed Care, Other (non HMO) | Admitting: Internal Medicine

## 2014-05-02 ENCOUNTER — Encounter: Payer: Self-pay | Admitting: Internal Medicine

## 2014-05-02 ENCOUNTER — Ambulatory Visit (INDEPENDENT_AMBULATORY_CARE_PROVIDER_SITE_OTHER): Payer: Managed Care, Other (non HMO) | Admitting: Internal Medicine

## 2014-05-02 VITALS — BP 130/80 | Temp 97.8°F | Wt 191.0 lb

## 2014-05-02 DIAGNOSIS — J329 Chronic sinusitis, unspecified: Secondary | ICD-10-CM | POA: Insufficient documentation

## 2014-05-02 DIAGNOSIS — J019 Acute sinusitis, unspecified: Secondary | ICD-10-CM | POA: Insufficient documentation

## 2014-05-02 DIAGNOSIS — J018 Other acute sinusitis: Secondary | ICD-10-CM

## 2014-05-02 MED ORDER — AMOXICILLIN 500 MG PO TABS: 1000.0000 mg | ORAL_TABLET | Freq: Two times a day (BID) | ORAL | Status: DC

## 2014-05-02 NOTE — Patient Instructions (Signed)
Please try acetaminophen for the pressure and headache (like 650mg  every 4-6 hours) If you are getting worse over the next couple of days, please start the amoxicillin antibiotic.

## 2014-05-02 NOTE — Assessment & Plan Note (Signed)
May still be viral Discussed supportive Rx Amoxicillin if worsens

## 2014-05-02 NOTE — Progress Notes (Signed)
Subjective:    Patient ID: Kaitlyn Bowman, female    DOB: [birth year], 53 y.o.   MRN: [id number]  HPI Having bad head pressure Headache for 4 days--worried about BP Some nasal drainage and PND Some cough--some yellow mucus  Some ear pain Has sore throat Some SOB---only from nasal congestion No fever Slight night sweats and chills  No meds for this  Current Outpatient Prescriptions on File Prior to Visit  Medication Sig Dispense Refill  . aspirin EC 81 MG tablet Take 1 tablet (81 mg total) by mouth daily.  90 tablet  3  . nitroGLYCERIN (NITROSTAT) 0.4 MG SL tablet Place 1 tablet (0.4 mg total) under the tongue every 5 (five) minutes x 3 doses as needed for chest pain.  25 tablet  4  . pravastatin (PRAVACHOL) 80 MG tablet Take 1 tablet (80 mg total) by mouth daily.  30 tablet  1  . Cromolyn Sodium (NASAL ALLERGY NA) Place into the nose as needed.       No current facility-administered medications on file prior to visit.    Allergies  Allergen Reactions  . Atorvastatin     Myalgia   . Ciprofloxacin     REACTION: nause-sweating-chest tightness  . Livalo [Pitavastatin]     myalgia    Past Medical History  Diagnosis Date  . Coronary artery disease      inferior STEMI 01/13/12 s/p PTCA/DES to mid RCA. Initial EF 45% by cath but improved to 55-60% by echo 01/14/12  . GERD (gastroesophageal reflux disease)   . Cocaine abuse     Remote crack cocaine use. UDS negative for cocaine 01/2012  . Dyslipidemia     Trig 201, HDL 36, LDL 197 01/2012  . Abnormal TSH   . History of MI (myocardial infarction) 01/2012  . Alcohol abuse   . Hypothyroidism     Past Surgical History  Procedure Laterality Date  . Cardiac catheterization  2010, 2013    Negative. 2013--100% blockage  . Abdominal hysterectomy      partial    Family History  Problem Relation Age of Onset  . Heart attack Mother 70    prior pacemaker  . Heart disease Mother   . Diabetes Mother   . Heart failure Father    CHF  . Heart disease Father   . Stroke Brother     History   Social History  . Marital Status: Single    Spouse Name: N/A    Number of Children: 1  . Years of Education: N/A   Occupational History  . 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 Smoking status: Former Smoker -- 33 years    Types: Cigarettes    Quit date: 06/13/2008  . Smokeless tobacco: Never Used     Comment: None since 2009   . Alcohol Use: No     Comment: former  . Drug Use: No     Comment: Crack cocaine; none since Jan 2006   . Sexual Activity: Yes    Birth Control/ Protection: Surgical   Other Topics Concern  . 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   Enjoys reading, sewing, Fill in puzzles, walking   Completed GED.   No  pets.                  Review of Systems No rash No vomiting or diarrhea Appetite is okay     Objective:   Physical Exam  Constitutional: She appears well-developed and well-nourished. No distress.  HENT:  Mouth/Throat: Oropharynx is clear and moist. No oropharyngeal exudate.  Mild frontal>maxillary tenderness Moderate nasal inflammation TMs normal  Neck: Normal range of motion. Neck supple. No thyromegaly present.  Pulmonary/Chest: Effort normal and breath sounds normal. No respiratory distress. She has no wheezes. She has no rales.  Lymphadenopathy:    She has no cervical adenopathy.  Skin: No rash noted.          Assessment & Plan:

## 2014-05-02 NOTE — Progress Notes (Signed)
Pre visit review using our clinic review tool, if applicable. No additional management support is needed unless otherwise documented below in the visit note. 

## 2014-05-11 ENCOUNTER — Ambulatory Visit (HOSPITAL_BASED_OUTPATIENT_CLINIC_OR_DEPARTMENT_OTHER)
Admission: RE | Admit: 2014-05-11 | Discharge: 2014-05-11 | Disposition: A | Discharge status: Home / Self Care | Payer: Managed Care, Other (non HMO) | Source: Ambulatory Visit | Attending: Family | Admitting: Family

## 2014-05-11 ENCOUNTER — Ambulatory Visit (INDEPENDENT_AMBULATORY_CARE_PROVIDER_SITE_OTHER): Payer: Managed Care, Other (non HMO) | Admitting: Family

## 2014-05-11 ENCOUNTER — Encounter: Payer: Self-pay | Admitting: Family

## 2014-05-11 VITALS — BP 118/82 | HR 56 | Temp 98.4°F | Resp 16 | Ht 67.5 in | Wt 192.0 lb

## 2014-05-11 DIAGNOSIS — J018 Other acute sinusitis: Secondary | ICD-10-CM

## 2014-05-11 DIAGNOSIS — K429 Umbilical hernia without obstruction or gangrene: Secondary | ICD-10-CM

## 2014-05-11 DIAGNOSIS — E785 Hyperlipidemia, unspecified: Secondary | ICD-10-CM

## 2014-05-11 DIAGNOSIS — M79605 Pain in left leg: Secondary | ICD-10-CM

## 2014-05-11 DIAGNOSIS — M79609 Pain in unspecified limb: Secondary | ICD-10-CM

## 2014-05-11 DIAGNOSIS — M722 Plantar fascial fibromatosis: Secondary | ICD-10-CM | POA: Insufficient documentation

## 2014-05-11 MED ORDER — MELOXICAM 7.5 MG PO TABS: 7.5000 mg | ORAL_TABLET | Freq: Every day | ORAL | Status: DC

## 2014-05-11 NOTE — Assessment & Plan Note (Signed)
Obtain LLE doppler, ? Baker's cyst?

## 2014-05-11 NOTE — Assessment & Plan Note (Signed)
Resolved

## 2014-05-11 NOTE — Patient Instructions (Addendum)
Start meloxicam once daily for heel pain. Ice your right heel twice daily. Do exercises for heel pain twice daily.  Arrange follow up with surgeon to schedule your hernia repair. Please complete lab work prior to leaving.  Complete your ultrasound on the first floor. Follow up in 3 months, sooner if problems/concerns.

## 2014-05-11 NOTE — Assessment & Plan Note (Signed)
Reports that she started statin 2 weeks ago. Advised her to return to the lab in 1 month for blood draw.

## 2014-05-11 NOTE — Assessment & Plan Note (Signed)
Advised pt on exercises (given pt ed handout from [employer]), short course of meloxicam and bid icing.

## 2014-05-11 NOTE — Progress Notes (Signed)
Pre visit review using our clinic review tool, if applicable. No additional management support is needed unless otherwise documented below in the visit note. 

## 2014-05-11 NOTE — Progress Notes (Signed)
Subjective:    Patient ID: Kaitlyn Bowman, female    DOB: February 03, 1961, 53 y.o.   MRN: [id number]  HPI  Kaitlyn Bowman is a 53 yr old female who present today with several concerns:  1) Leg pain- reports that she has pain behind the left knee.  Notes that symptoms started the day that she started pravastatin.    2) umbilical hernia- she reports that she continues to have acid reflux.     3) Heel pain x 6 months. Has been worsening recently. Reports pain is worst with weight bearing.    4) Headache-  Reports that for the last 1 week she has had increased headache symptoms.  Reports that HA has resolved since she started abx for sinus infection.  She saw Dr. Silvio Pate on 7/25.    Review of Systems    see HPI  Past Medical History  Diagnosis Date  . Coronary artery disease      inferior STEMI 01/13/12 s/p PTCA/DES to mid RCA. Initial EF 45% by cath but improved to 55-60% by echo 01/14/12  . GERD (gastroesophageal reflux disease)   . Cocaine abuse     Remote crack cocaine use. UDS negative for cocaine 01/2012  . Dyslipidemia     Trig 201, HDL 36, LDL 197 01/2012  . Abnormal TSH   . History of MI (myocardial infarction) 01/2012  . Alcohol abuse   . Hypothyroidism     History   Social History  . Marital Status: Single    Spouse Name: N/A    Number of Children: 1  . Years of Education: N/A   Occupational History  . 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 Smoking status: Former Smoker -- 33 years    Types: Cigarettes    Quit date: 06/13/2008  . Smokeless tobacco: Never Used     Comment: None since 2009   . Alcohol Use: No     Comment: former  . Drug Use: No     Comment: Crack cocaine; none since Jan 2006   . Sexual Activity: Yes    Birth Control/ Protection: Surgical   Other Topics Concern  . 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   Enjoys reading, sewing, Fill in puzzles, walking   Completed GED.   No pets.                   Past Surgical History  Procedure Laterality Date  . Cardiac catheterization  2010, 2013    Negative. 2013--100% blockage  . Abdominal hysterectomy      partial    Family History  Problem Relation Age of Onset  . Heart attack Mother 64    prior pacemaker  . Heart disease Mother   . Diabetes Mother   . Heart failure Father     CHF  . Heart disease Father   . Stroke Brother     Allergies  Allergen Reactions  . Atorvastatin     Myalgia   . Ciprofloxacin     REACTION: nause-sweating-chest tightness  . Livalo [Pitavastatin]     myalgia    Current Outpatient Prescriptions on File Prior to Visit  Medication Sig Dispense Refill  . amoxicillin (AMOXIL)  500 MG tablet Take 2 tablets (1,000 mg total) by mouth 2 (two) times daily.  40 tablet  0  . aspirin EC 81 MG tablet Take 1 tablet (81 mg total) by mouth daily.  90 tablet  3  . Cromolyn Sodium (NASAL ALLERGY NA) Place into the nose as needed.      . nitroGLYCERIN (NITROSTAT) 0.4 MG SL tablet Place 1 tablet (0.4 mg total) under the tongue every 5 (five) minutes x 3 doses as needed for chest pain.  25 tablet  4  . pravastatin (PRAVACHOL) 80 MG tablet Take 1 tablet (80 mg total) by mouth daily.  30 tablet  1   No current facility-administered medications on file prior to visit.    BP 118/82  Pulse 56  Temp(Src) 98.4 F (36.9 C) (Oral)  Resp 16  Ht 5' 7.5" (1.715 m)  Wt 192 lb (87.091 kg)  BMI 29.61 kg/m2  SpO2 99%    Objective:   Physical Exam  Constitutional: She is oriented to person, place, and time. She appears well-developed and well-nourished. No distress.  Cardiovascular: Normal rate and regular rhythm.   No murmur heard. Pulmonary/Chest: Effort normal and breath sounds normal. No respiratory distress. She has no wheezes. She has no rales. She exhibits no tenderness.    Abdominal: Soft.  Reducible umbilical hernia is noted.  + BS, mild tenderness surrounding the umbilical hernia.   Musculoskeletal:  LLE without swelling, non-tender behind the left knee  R heel and arch are tender to palpation  Neurological: She is alert and oriented to person, place, and time.          Assessment & Plan:

## 2014-05-11 NOTE — Assessment & Plan Note (Signed)
Advised pt to schedule elective hernia repair. She plans to do this in the fall when she has some vacation time.

## 2014-07-05 ENCOUNTER — Other Ambulatory Visit: Payer: Self-pay | Admitting: Family

## 2014-07-06 ENCOUNTER — Telehealth: Payer: Self-pay | Admitting: Family

## 2014-07-06 NOTE — Telephone Encounter (Signed)
Left message for pt to return my call.

## 2014-07-06 NOTE — Telephone Encounter (Signed)
Pt is requesting to speak with [facility]. Pt states her friend got killed and she is just very upset and her nerves is really bad and she just don't know what to do.

## 2014-07-06 NOTE — Telephone Encounter (Signed)
Spoke with pt and advised her that Provider will need to see her in the office before she can prescribe medication. Pt voices understanding and requests a Monday or Tuesday. States she is feeling a little better about the situation at this time. Offered pt appt with another Provider in the office but she declines and states she will only see [name]. Scheduled appt for 07/13/14 at 1:15pm and advised pt to call for earlier appt if symptoms worsen and she voices understanding.

## 2014-07-06 NOTE — Telephone Encounter (Signed)
Rx request to pharmacy/SLS  

## 2014-07-13 ENCOUNTER — Encounter: Payer: Self-pay | Admitting: Family

## 2014-07-13 ENCOUNTER — Ambulatory Visit (INDEPENDENT_AMBULATORY_CARE_PROVIDER_SITE_OTHER): Payer: Managed Care, Other (non HMO) | Admitting: Family

## 2014-07-13 VITALS — BP 115/78 | HR 65 | Temp 97.8°F | Resp 16 | Ht 67.5 in | Wt 194.8 lb

## 2014-07-13 DIAGNOSIS — J302 Other seasonal allergic rhinitis: Secondary | ICD-10-CM

## 2014-07-13 DIAGNOSIS — E785 Hyperlipidemia, unspecified: Secondary | ICD-10-CM

## 2014-07-13 DIAGNOSIS — F4321 Adjustment disorder with depressed mood: Secondary | ICD-10-CM

## 2014-07-13 DIAGNOSIS — M791 Myalgia, unspecified site: Secondary | ICD-10-CM

## 2014-07-13 LAB — CK: CK TOTAL: 145 U/L (ref 7–177)

## 2014-07-13 NOTE — Progress Notes (Signed)
Pre visit review using our clinic review tool, if applicable. No additional management support is needed unless otherwise documented below in the visit note. 

## 2014-07-13 NOTE — Progress Notes (Signed)
Subjective:    Patient ID: Kaitlyn Bowman, female    DOB: 07-02-1961, 53 y.o.   MRN: [id number]  HPI  Kaitlyn Bowman is a 53 yr old female who presents today to discuss two concerns:  1) Grief Reaction- reports that she recently lost a friend and is struggling with grief/anxiety. Occurred about 2 weeks ago.  She took 4 days off from work.    2) Headache-attributes to allergies.    3) Myalgia- has been taking pravastatin x 2 months,  However she notes that after 1 month she starts to have pain in legs.   Review of Systems See HPI  Past Medical History  Diagnosis Date  . Coronary artery disease      inferior STEMI 01/13/12 s/p PTCA/DES to mid RCA. Initial EF 45% by cath but improved to 55-60% by echo 01/14/12  . GERD (gastroesophageal reflux disease)   . Cocaine abuse     Remote crack cocaine use. UDS negative for cocaine 01/2012  . Dyslipidemia     Trig 201, HDL 36, LDL 197 01/2012  . Abnormal TSH   . History of MI (myocardial infarction) 01/2012  . Alcohol abuse   . Hypothyroidism     History   Social History  . Marital Status: Single    Spouse Name: N/A    Number of Children: 1  . Years of Education: N/A   Occupational History  . 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 Smoking status: Former Smoker -- 33 years    Types: Cigarettes    Quit date: 06/13/2008  . Smokeless tobacco: Never Used     Comment: None since 2009   . Alcohol Use: No     Comment: former  . Drug Use: No     Comment: Crack cocaine; none since Jan 2006   . Sexual Activity: Yes    Birth Control/ Protection: Surgical   Other Topics Concern  . 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   Enjoys reading, sewing, Fill in puzzles, walking   Completed GED.   No pets.                   Past Surgical History    Procedure Laterality Date  . Cardiac catheterization  2010, 2013    Negative. 2013--100% blockage  . Abdominal hysterectomy      partial    Family History  Problem Relation Age of Onset  . Heart attack Mother 11    prior pacemaker  . Heart disease Mother   . Diabetes Mother   . Heart failure Father     CHF  . Heart disease Father   . Stroke Brother     Allergies  Allergen Reactions  . Atorvastatin     Myalgia   . Ciprofloxacin     REACTION: nause-sweating-chest tightness  . Livalo [Pitavastatin]     myalgia    Current Outpatient Prescriptions on File Prior to Visit  Medication Sig Dispense Refill  . aspirin EC 81 MG tablet Take 1 tablet (81 mg total) by mouth daily.  90 tablet  3  . Cromolyn Sodium (NASAL ALLERGY NA) Place into the nose as needed.      . meloxicam (MOBIC)  7.5 MG tablet Take 1 tablet (7.5 mg total) by mouth daily.  14 tablet  0  . nitroGLYCERIN (NITROSTAT) 0.4 MG SL tablet Place 1 tablet (0.4 mg total) under the tongue every 5 (five) minutes x 3 doses as needed for chest pain.  25 tablet  4  . pravastatin (PRAVACHOL) 80 MG tablet TAKE 1 TABLET BY MOUTH EVERY DAY  30 tablet  2   No current facility-administered medications on file prior to visit.    BP 115/78  Pulse 65  Temp(Src) 97.8 F (36.6 C) (Oral)  Resp 16  Ht 5' 7.5" (1.715 m)  Wt 194 lb 12.8 oz (88.361 kg)  BMI 30.04 kg/m2  SpO2 100%       Objective:   Physical Exam  Constitutional: She is oriented to person, place, and time. She appears well-developed and well-nourished. No distress.  HENT:  Head: Normocephalic and atraumatic.  Cardiovascular: Normal rate and regular rhythm.   No murmur heard. Pulmonary/Chest: Effort normal and breath sounds normal. No respiratory distress. She has no wheezes. She has no rales. She exhibits no tenderness.  Neurological: She is alert and oriented to person, place, and time.  Psychiatric: Her behavior is normal. Judgment and thought content  normal.  Tearful, but pleasant and appropriate          Assessment & Plan:  Declines flu shot.

## 2014-07-13 NOTE — Assessment & Plan Note (Signed)
We discussed referring her to meet with one of our therapists for grief counseling. She is agreeable.

## 2014-07-13 NOTE — Assessment & Plan Note (Signed)
Myalgia on pravastatin, hold pravastatin, check CK level today, call me in 1 week to update me on how she is feeling.  Unfortunately, she cannot afford a brand name drug such as zetia.

## 2014-07-13 NOTE — Patient Instructions (Addendum)
Please complete lab work prior to leaving.  Add claritin 10mg  once daily for allergies. Please call Behavioral health and schedule an appointment at our office with Marya Amsler or Arville Lime to start some counseling. [phone] Stop pravastatin for 1 week and call me to let me know how you are feeling.   Follow up in 3 months.

## 2014-07-13 NOTE — Assessment & Plan Note (Signed)
Add claritin 

## 2014-08-17 ENCOUNTER — Ambulatory Visit: Payer: Managed Care, Other (non HMO) | Admitting: Family

## 2014-09-01 ENCOUNTER — Ambulatory Visit (INDEPENDENT_AMBULATORY_CARE_PROVIDER_SITE_OTHER): Payer: Managed Care, Other (non HMO) | Admitting: Physician Assistant

## 2014-09-01 ENCOUNTER — Encounter: Payer: Self-pay | Admitting: Physician Assistant

## 2014-09-01 VITALS — BP 126/75 | HR 76 | Temp 98.6°F | Wt 198.2 lb

## 2014-09-01 DIAGNOSIS — J00 Acute nasopharyngitis [common cold]: Secondary | ICD-10-CM

## 2014-09-01 DIAGNOSIS — B9689 Other specified bacterial agents as the cause of diseases classified elsewhere: Secondary | ICD-10-CM | POA: Insufficient documentation

## 2014-09-01 DIAGNOSIS — J208 Acute bronchitis due to other specified organisms: Principal | ICD-10-CM

## 2014-09-01 MED ORDER — AZITHROMYCIN 250 MG PO TABS: ORAL_TABLET | ORAL | Status: DC

## 2014-09-01 MED ORDER — PROMETHAZINE-DM 6.25-15 MG/5ML PO SYRP: 5.0000 mL | ORAL_SOLUTION | Freq: Four times a day (QID) | ORAL | Status: DC | PRN

## 2014-09-01 NOTE — Progress Notes (Signed)
Pre visit review using our clinic review tool, if applicable. No additional management support is needed unless otherwise documented below in the visit note. 

## 2014-09-01 NOTE — Patient Instructions (Signed)
Please take antibiotic and cough medication as directed.  Increase fluids.  Rest.  Use saline nasal spray to keep sinuses flushed out.  Use Mucinex as directed for chest congestion. Follow-up if symptoms are not improving.  Acute Bronchitis Bronchitis is inflammation of the airways that extend from the windpipe into the lungs (bronchi). The inflammation often causes mucus to develop. This leads to a cough, which is the most common symptom of bronchitis.  In acute bronchitis, the condition usually develops suddenly and goes away over time, usually in a couple weeks. Smoking, allergies, and asthma can make bronchitis worse. Repeated episodes of bronchitis may cause further lung problems.  CAUSES Acute bronchitis is most often caused by the same virus that causes a cold. The virus can spread from person to person (contagious) through coughing, sneezing, and touching contaminated objects. SIGNS AND SYMPTOMS   Cough.   Fever.   Coughing up mucus.   Body aches.   Chest congestion.   Chills.   Shortness of breath.   Sore throat.  DIAGNOSIS  Acute bronchitis is usually diagnosed through a physical exam. Your health care provider will also ask you questions about your medical history. Tests, such as chest X-rays, are sometimes done to rule out other conditions.  TREATMENT  Acute bronchitis usually goes away in a couple weeks. Oftentimes, no medical treatment is necessary. Medicines are sometimes given for relief of fever or cough. Antibiotic medicines are usually not needed but may be prescribed in certain situations. In some cases, an inhaler may be recommended to help reduce shortness of breath and control the cough. A cool mist vaporizer may also be used to help thin bronchial secretions and make it easier to clear the chest.  HOME CARE INSTRUCTIONS  Get plenty of rest.   Drink enough fluids to keep your urine clear or pale yellow (unless you have a medical condition that requires  fluid restriction). Increasing fluids may help thin your respiratory secretions (sputum) and reduce chest congestion, and it will prevent dehydration.   Take medicines only as directed by your health care provider.  If you were prescribed an antibiotic medicine, finish it all even if you start to feel better.  Avoid smoking and secondhand smoke. Exposure to cigarette smoke or irritating chemicals will make bronchitis worse. If you are a smoker, consider using nicotine gum or skin patches to help control withdrawal symptoms. Quitting smoking will help your lungs heal faster.   Reduce the chances of another bout of acute bronchitis by washing your hands frequently, avoiding people with cold symptoms, and trying not to touch your hands to your mouth, nose, or eyes.   Keep all follow-up visits as directed by your health care provider.  SEEK MEDICAL CARE IF: Your symptoms do not improve after 1 week of treatment.  SEEK IMMEDIATE MEDICAL CARE IF:  You develop an increased fever or chills.   You have chest pain.   You have severe shortness of breath.  You have bloody sputum.   You develop dehydration.  You faint or repeatedly feel like you are going to pass out.  You develop repeated vomiting.  You develop a severe headache. MAKE SURE YOU:   Understand these instructions.  Will watch your condition.  Will get help right away if you are not doing well or get worse. Document Released: 11/02/2004 Document Revised: 02/09/2014 Document Reviewed: 03/18/2013 [facility] Patient Information 2015 [place], [place]. This information is not intended to replace advice given to you by your health care  provider. Make sure you discuss any questions you have with your health care provider.

## 2014-09-01 NOTE — Assessment & Plan Note (Signed)
Rx Azithromycin. Increase fluids.  Rest. Probiotic.  Mucinex.  RX promethazine for cough. Return precautions discussed with patient.

## 2014-09-01 NOTE — Progress Notes (Signed)
Patient presents to clinic today c/o productive cough, fatigue, sore throat, fatigue/malaise for > 1 week.  Endorses intermittent fevers.  Endorses ear pressure and sinus pressure but denies sinus pain.  Denies recent travel or sick contact.  Has not had flu shot this year.  Past Medical History  Diagnosis Date  . Coronary artery disease      inferior STEMI 01/13/12 s/p PTCA/DES to mid RCA. Initial EF 45% by cath but improved to 55-60% by echo 01/14/12  . GERD (gastroesophageal reflux disease)   . Cocaine abuse     Remote crack cocaine use. UDS negative for cocaine 01/2012  . Dyslipidemia     Trig 201, HDL 36, LDL 197 01/2012  . Abnormal TSH   . History of MI (myocardial infarction) 01/2012  . Alcohol abuse   . Hypothyroidism     Current Outpatient Prescriptions on File Prior to Visit  Medication Sig Dispense Refill  . aspirin EC 81 MG tablet Take 1 tablet (81 mg total) by mouth daily. 90 tablet 3  . Cromolyn Sodium (NASAL ALLERGY NA) Place into the nose as needed.    . nitroGLYCERIN (NITROSTAT) 0.4 MG SL tablet Place 1 tablet (0.4 mg total) under the tongue every 5 (five) minutes x 3 doses as needed for chest pain. 25 tablet 4   No current facility-administered medications on file prior to visit.    Allergies  Allergen Reactions  . Atorvastatin     Myalgia   . Ciprofloxacin     REACTION: nause-sweating-chest tightness  . Livalo [Pitavastatin]     myalgia    Family History  Problem Relation Age of Onset  . Heart attack Mother 63    prior pacemaker  . Heart disease Mother   . Diabetes Mother   . Heart failure Father     CHF  . Heart disease Father   . Stroke Brother     History   Social History  . Marital Status: Single    Spouse Name: N/A    Number of Children: 1  . Years of Education: N/A   Occupational History  . 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 Smoking status: Former Smoker -- 33 years    Types: Cigarettes    Quit date: 06/13/2008  .  Smokeless tobacco: Never Used     Comment: None since 2009   . Alcohol Use: No     Comment: former  . Drug Use: No     Comment: Crack cocaine; none since Jan 2006   . Sexual Activity: Yes    Birth Control/ Protection: Surgical   Other Topics Concern  . 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   Enjoys reading, sewing, Fill in puzzles, walking   Completed GED.   No pets.                  Review of Systems - See HPI.  All other ROS are negative.  BP 126/75 mmHg  Pulse 76  Temp(Src) 98.6 F (37 C) (Oral)  Wt 198 lb 3.2 oz (89.903 kg)  SpO2 99%  Physical Exam  Constitutional: She is oriented to person, place, and time and well-developed, well-nourished, and in no distress.  HENT:  Head:  Normocephalic and atraumatic.  Right Ear: External ear normal.  Left Ear: External ear normal.  Nose: Nose normal.  Mouth/Throat: Oropharynx is clear and moist. No oropharyngeal exudate.  Eyes: Conjunctivae are normal. Pupils are equal, round, and reactive to light.  Neck: Neck supple.  Cardiovascular: Normal rate, regular rhythm, normal heart sounds and intact distal pulses.   Pulmonary/Chest: Effort normal and breath sounds normal. No respiratory distress. She has no wheezes. She has no rales. She exhibits no tenderness.  Lymphadenopathy:    She has no cervical adenopathy.  Neurological: She is alert and oriented to person, place, and time.  Skin: Skin is warm and dry. No rash noted.  Psychiatric: Affect normal.  Vitals reviewed.   Recent Results (from the past 2160 hour(s))  CK (Creatine Kinase)     Status: None   Collection Time: 07/13/14  2:06 PM  Result Value Ref Range   Total CK 145 7 - 177 U/L    Assessment/Plan: Acute bacterial bronchitis Rx Azithromycin. Increase fluids.  Rest. Probiotic.  Mucinex.   RX promethazine for cough. Return precautions discussed with patient.

## 2014-09-10 ENCOUNTER — Ambulatory Visit (INDEPENDENT_AMBULATORY_CARE_PROVIDER_SITE_OTHER): Payer: Managed Care, Other (non HMO) | Admitting: Medical

## 2014-09-10 DIAGNOSIS — H6692 Otitis media, unspecified, left ear: Secondary | ICD-10-CM | POA: Insufficient documentation

## 2014-09-10 DIAGNOSIS — H6690 Otitis media, unspecified, unspecified ear: Secondary | ICD-10-CM | POA: Insufficient documentation

## 2014-09-10 DIAGNOSIS — H6590 Unspecified nonsuppurative otitis media, unspecified ear: Secondary | ICD-10-CM

## 2014-09-10 DIAGNOSIS — H60399 Other infective otitis externa, unspecified ear: Secondary | ICD-10-CM | POA: Insufficient documentation

## 2014-09-10 MED ORDER — CEFTRIAXONE SODIUM 1 G IJ SOLR
1.0000 g | Freq: Once | INTRAMUSCULAR | Status: AC
  Administered 2014-09-10: 1 g via INTRAMUSCULAR

## 2014-09-10 MED ORDER — CEPHALEXIN 500 MG PO CAPS: 500.0000 mg | ORAL_CAPSULE | Freq: Three times a day (TID) | ORAL | Status: DC

## 2014-09-10 MED ORDER — NEOMYCIN-POLYMYXIN-HC 3.5-10000-1 OT SOLN: 3.0000 [drp] | Freq: Four times a day (QID) | OTIC | Status: DC

## 2014-09-10 MED ORDER — BENZONATATE 100 MG PO CAPS: 100.0000 mg | ORAL_CAPSULE | Freq: Three times a day (TID) | ORAL | Status: DC | PRN

## 2014-09-10 NOTE — Progress Notes (Signed)
Subjective:    Patient ID: Kaitlyn Bowman, female    DOB: [birth year], 53 y.o.   MRN: [id number]  HPI   Pt in today reporting had cough, nasal congestion and runny nose for 10  Days approximate. Pt diagnosed with bronchitis on November 24th. Treated with zpack and feels better but not completely.  She states feels better but her left ear is hurting. Still has residual cough. Earlier today felt feverish. Her ear hurts a lot.   Pt states   Associated symptoms( below yes or no)  Fever-no Chills-no Chest congestion-no Sneezing- no Itching eyes-no Sore throat- no Post-nasal drainage-no Wheezing-no Purulent drainage-no Fatigue-yes     Past Medical History  Diagnosis Date  . Coronary artery disease      inferior STEMI 01/13/12 s/p PTCA/DES to mid RCA. Initial EF 45% by cath but improved to 55-60% by echo 01/14/12  . GERD (gastroesophageal reflux disease)   . Cocaine abuse     Remote crack cocaine use. UDS negative for cocaine 01/2012  . Dyslipidemia     Trig 201, HDL 36, LDL 197 01/2012  . Abnormal TSH   . History of MI (myocardial infarction) 01/2012  . Alcohol abuse   . Hypothyroidism     History   Social History  . Marital Status: Single    Spouse Name: N/A    Number of Children: 1  . Years of Education: N/A   Occupational History  . 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 Smoking status: Former Smoker -- 33 years    Types: Cigarettes    Quit date: 06/13/2008  . Smokeless tobacco: Never Used     Comment: None since 2009   . Alcohol Use: No     Comment: former  . Drug Use: No     Comment: Crack cocaine; none since Jan 2006   . Sexual Activity: Yes    Birth Control/ Protection: Surgical   Other Topics Concern  . 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   Enjoys reading,  sewing, Fill in puzzles, walking   Completed GED.   No pets.                   Past Surgical History  Procedure Laterality Date  . Cardiac catheterization  2010, 2013    Negative. 2013--100% blockage  . Abdominal hysterectomy      partial    Family History  Problem Relation Age of Onset  . Heart attack Mother 37    prior pacemaker  . Heart disease Mother   . Diabetes Mother   . Heart failure Father     CHF  . Heart disease Father   . Stroke Brother     Allergies  Allergen Reactions  . Atorvastatin     Myalgia   . Ciprofloxacin     REACTION: nause-sweating-chest tightness  . Livalo [Pitavastatin]     myalgia    Current Outpatient Prescriptions on File Prior to Visit  Medication Sig Dispense Refill  . aspirin EC 81 MG tablet Take 1 tablet (81 mg total) by mouth daily. 90 tablet 3  . Cromolyn Sodium (NASAL ALLERGY NA) Place into the nose as needed.    . nitroGLYCERIN (NITROSTAT)  0.4 MG SL tablet Place 1 tablet (0.4 mg total) under the tongue every 5 (five) minutes x 3 doses as needed for chest pain. 25 tablet 4  . promethazine-dextromethorphan (PROMETHAZINE-DM) 6.25-15 MG/5ML syrup Take 5 mLs by mouth 4 (four) times daily as needed for cough. 118 mL 0   No current facility-administered medications on file prior to visit.    There were no vitals taken for this visit.        Review of Systems  Constitutional: Positive for fatigue. Negative for fever and chills.  HENT: Positive for ear pain. Negative for congestion, postnasal drip, sinus pressure and sore throat.        Lt ear pain.  Respiratory: Positive for cough. Negative for wheezing.        Rare  Cardiovascular: Negative for chest pain and palpitations.  Musculoskeletal: Negative for neck pain.  Neurological: Negative for dizziness and headaches.  Hematological: Negative for adenopathy. Does not bruise/bleed easily.       Objective:   Physical Exam   General- no acute distress, Pleasant  pt.  Neck- full Range of motion, no nucal rigidity.    HEENT- Head- Normocephalic. Eyes- PEERL bilaterally, Ears- Rt canal clear and  rt TM normal. Lt canal clear      but  Tm mild  Red tm. No mastoid tenderness either side. Tragus tender on left side. Canal mild swollen Nose- No frontal or maxillary sinus tenderness. Mild inflamed turbinated Throat- No tonsillar hypertrophy, No exudate.   Lungs- Clear even, and unlabored.  Heart- Regular, rate and rhythm.  Neurologic- CN III-XII grossly intact.        Assessment & Plan:

## 2014-09-10 NOTE — Assessment & Plan Note (Signed)
rx cortisporin otic

## 2014-09-10 NOTE — Patient Instructions (Addendum)
You appear to have lt om following bronchitis. You requested injection and we gave you rocephin today. I am also prescribing cephalexin.    For your residual cough from bronchitis, I am prescribing benzonatate.  Some otitis externa as well. Rx cortisporin otic.  Follow up 7 days or as needed.

## 2014-09-10 NOTE — Progress Notes (Signed)
Pre visit review using our clinic review tool, if applicable. No additional management support is needed unless otherwise documented below in the visit note. 

## 2014-09-10 NOTE — Assessment & Plan Note (Signed)
You appear to have lt om following bronchitis. You requested injection and we gave you rocephin today. I am also prescribing cephalexin.

## 2014-09-17 ENCOUNTER — Encounter (HOSPITAL_COMMUNITY): Payer: Self-pay | Admitting: Cardiovascular Disease

## 2014-09-25 ENCOUNTER — Telehealth: Payer: Self-pay | Admitting: Family

## 2014-09-25 MED ORDER — MELOXICAM 7.5 MG PO TABS: 7.5000 mg | ORAL_TABLET | Freq: Every day | ORAL | Status: DC

## 2014-09-25 NOTE — Telephone Encounter (Signed)
meloxicam as needed. Follow up in office if not improved by Monday.

## 2014-09-25 NOTE — Telephone Encounter (Signed)
Notified pt and she voices understanding. 
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
## 2014-09-29 ENCOUNTER — Ambulatory Visit: Payer: Managed Care, Other (non HMO) | Admitting: Medical

## 2014-09-30 ENCOUNTER — Encounter: Payer: Self-pay | Admitting: Medical

## 2014-09-30 ENCOUNTER — Ambulatory Visit (HOSPITAL_BASED_OUTPATIENT_CLINIC_OR_DEPARTMENT_OTHER)
Admission: RE | Admit: 2014-09-30 | Discharge: 2014-09-30 | Disposition: A | Discharge status: Home / Self Care | Payer: Managed Care, Other (non HMO) | Source: Ambulatory Visit | Attending: Medical | Admitting: Medical

## 2014-09-30 ENCOUNTER — Ambulatory Visit (INDEPENDENT_AMBULATORY_CARE_PROVIDER_SITE_OTHER): Payer: Managed Care, Other (non HMO) | Admitting: Medical

## 2014-09-30 VITALS — BP 119/82 | HR 77 | Temp 97.9°F | Ht 67.5 in | Wt 199.0 lb

## 2014-09-30 DIAGNOSIS — M898X1 Other specified disorders of bone, shoulder: Secondary | ICD-10-CM | POA: Insufficient documentation

## 2014-09-30 DIAGNOSIS — M25512 Pain in left shoulder: Secondary | ICD-10-CM | POA: Insufficient documentation

## 2014-09-30 DIAGNOSIS — M25531 Pain in right wrist: Secondary | ICD-10-CM | POA: Diagnosis not present

## 2014-09-30 NOTE — Assessment & Plan Note (Signed)
For the lt clavicle pain,  We will get left clavicle xray.  You can use 1-2 tabs of the meloxicam every day for pain. We will notify you on xray results.

## 2014-09-30 NOTE — Progress Notes (Signed)
Pre visit review using our clinic review tool, if applicable. No additional management support is needed unless otherwise documented below in the visit note. 

## 2014-09-30 NOTE — Patient Instructions (Addendum)
For your rt wrist pain and lt clavicle pain, we will get rt wrist xray and left clavicle xray.  You can use 1-2 tabs of the meloxicam every day for pain. Continue flexeril at night if needed.  I recommend getting a wrist cock up splint and using it for your rt wrist.  We will call you on the xray results when those are in.  Follow up in 7 days or as needed.

## 2014-09-30 NOTE — Assessment & Plan Note (Signed)
For your rt wrist pain, we will get rt wrist xray.  You can use 1-2 tabs of the meloxicam every day for pain. Continue flexeril at night if needed.(rx by Emergency dept)  I recommend getting a wrist cock up splint and using it for your rt wrist.  We will call you on the xray results when those are in.
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
## 2014-10-06 ENCOUNTER — Encounter: Payer: Self-pay | Admitting: Medical

## 2014-10-06 ENCOUNTER — Ambulatory Visit (INDEPENDENT_AMBULATORY_CARE_PROVIDER_SITE_OTHER): Payer: Managed Care, Other (non HMO) | Admitting: Medical

## 2014-10-06 VITALS — BP 129/80 | HR 70 | Temp 98.3°F | Ht 67.5 in | Wt 201.2 lb

## 2014-10-06 DIAGNOSIS — M5442 Lumbago with sciatica, left side: Secondary | ICD-10-CM

## 2014-10-06 DIAGNOSIS — M5441 Lumbago with sciatica, right side: Secondary | ICD-10-CM

## 2014-10-06 DIAGNOSIS — M549 Dorsalgia, unspecified: Secondary | ICD-10-CM | POA: Insufficient documentation

## 2014-10-06 NOTE — Progress Notes (Signed)
Pre visit review using our clinic review tool, if applicable. No additional management support is needed unless otherwise documented below in the visit note. 

## 2014-10-06 NOTE — Assessment & Plan Note (Signed)
I want you to take your mobic 7.5 mg 1 tab a day and your flexeril at night. Do back exercises as demonstated. I am putting lumbar spine xray order to be done in a week if your pain persists.  Follow up in 10 days or as needed(for any residual pain)

## 2014-10-06 NOTE — Patient Instructions (Signed)
I want you to take your mobic 7.5 mg 1 tab a day and your flexeril at night. Do back exercises as demonstated. I am putting lumbar spine xray order to be done in a week if your pain persists.  Follow up in 10 days or as needed(for any residual pain)

## 2014-10-06 NOTE — Progress Notes (Signed)
Subjective:    Patient ID: Kaitlyn Bowman, female    DOB: [birth year], 53 y.o.   MRN: [id number]  HPI   Pt in for some low back pain. She states started on Sunday. Pt states was at work sitting from 3 pm to 11 pm. She stated about 8 pm the pain came on. If she stands up pain is relieved. If she stands stationary for a while then pain will begin. Pt states pain mid lumbar and mild bilateral SI area pain. No definite  fall or trauma prior to onset of back pain.    Pt mentions that the prior areas of pain rt wrist and lt clavicle for which she was seen on last visit have improved. Xrays were negative.    Past Medical History  Diagnosis Date  . Coronary artery disease      inferior STEMI 01/13/12 s/p PTCA/DES to mid RCA. Initial EF 45% by cath but improved to 55-60% by echo 01/14/12  . GERD (gastroesophageal reflux disease)   . Cocaine abuse     Remote crack cocaine use. UDS negative for cocaine 01/2012  . Dyslipidemia     Trig 201, HDL 36, LDL 197 01/2012  . Abnormal TSH   . History of MI (myocardial infarction) 01/2012  . Alcohol abuse   . Hypothyroidism     History   Social History  . Marital Status: Single    Spouse Name: N/A    Number of Children: 1  . Years of Education: N/A   Occupational History  . 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 Smoking status: Former Smoker -- 33 years    Types: Cigarettes    Quit date: 06/13/2008  . Smokeless tobacco: Never Used     Comment: None since 2009   . Alcohol Use: No     Comment: former  . Drug Use: No     Comment: Crack cocaine; none since Jan 2006   . Sexual Activity: Yes    Birth Control/ Protection: Surgical   Other Topics Concern  . 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   Enjoys reading, sewing, Fill in puzzles, walking   Completed  GED.   No pets.                   Past Surgical History  Procedure Laterality Date  . Cardiac catheterization  2010, 2013    Negative. 2013--100% blockage  . Abdominal hysterectomy      partial  . Left heart catheterization with coronary angiogram N/A 01/12/2012    Procedure: LEFT HEART CATHETERIZATION WITH CORONARY ANGIOGRAM;  Surgeon: Burnell Blanks, MD;  Location: [facility] CATH LAB;  Service: Cardiovascular;  Laterality: N/A;    Family History  Problem Relation Age of Onset  . Heart attack Mother 1    prior pacemaker  . Heart disease Mother   . Diabetes Mother   . Heart failure Father     CHF  . Heart disease Father   . Stroke Brother     Allergies  Allergen Reactions  . Atorvastatin     Myalgia   . Ciprofloxacin     REACTION: nause-sweating-chest tightness  . Livalo [Pitavastatin]     myalgia  Current Outpatient Prescriptions on File Prior to Visit  Medication Sig Dispense Refill  . aspirin EC 81 MG tablet Take 1 tablet (81 mg total) by mouth daily. 90 tablet 3  . Cromolyn Sodium (NASAL ALLERGY NA) Place into the nose as needed.    . meloxicam (MOBIC) 7.5 MG tablet Take 1 tablet (7.5 mg total) by mouth daily. 14 tablet 0  . nitroGLYCERIN (NITROSTAT) 0.4 MG SL tablet Place 1 tablet (0.4 mg total) under the tongue every 5 (five) minutes x 3 doses as needed for chest pain. 25 tablet 4   No current facility-administered medications on file prior to visit.    BP 129/80 mmHg  Pulse 70  Temp(Src) 98.3 F (36.8 C) (Oral)  Ht 5' 7.5" (1.715 m)  Wt 201 lb 3.2 oz (91.264 kg)  BMI 31.03 kg/m2  SpO2 97%      Review of Systems  Constitutional: Negative for fever, chills and fatigue.  Respiratory: Negative for cough, chest tightness and wheezing.   Cardiovascular: Negative for chest pain and palpitations.  Gastrointestinal: Negative for nausea, vomiting and abdominal pain.  Genitourinary: Negative for dysuria, urgency, hematuria and flank pain.    Musculoskeletal: Positive for back pain.       Some sciatica type pain.  Neurological: Negative for weakness and numbness.       Objective:   Physical Exam   General Appearance- Not in acute distress.    Chest and Lung Exam Auscultation: Breath sounds:-Normal. Clear even and unlabored. Adventitious sounds:- No Adventitious sounds.  Cardiovascular Auscultation:Rythm - Regular, rate and rythm. Heart Sounds -Normal heart sounds.  Abdomen Inspection:-Inspection Normal.  Palpation/Perucssion: Palpation and Percussion of the abdomen reveal- Non Tender, No Rebound tenderness, No rigidity(Guarding) and No Palpable abdominal masses.  Liver:-Normal.  Spleen:- Normal.   Back Mid lumbar spine tenderness to palpation. No pain on straight leg lift. Pain on lateral movements and flexion/extension of the spine. Mild bilateral si tenderness to palpation.  Lower ext neurologic  L5-S1 sensation intact bilaterally. Normal patellar reflexes bilaterally. No foot drop bilaterally.       Assessment & Plan:

## 2014-10-19 ENCOUNTER — Ambulatory Visit: Payer: Managed Care, Other (non HMO) | Admitting: Family

## 2014-10-19 ENCOUNTER — Ambulatory Visit: Payer: Managed Care, Other (non HMO) | Admitting: Cardiovascular Disease

## 2014-10-21 ENCOUNTER — Ambulatory Visit: Payer: Managed Care, Other (non HMO) | Admitting: Family

## 2014-11-02 ENCOUNTER — Ambulatory Visit: Payer: Managed Care, Other (non HMO) | Admitting: Cardiovascular Disease

## 2014-11-17 ENCOUNTER — Ambulatory Visit: Payer: Managed Care, Other (non HMO) | Admitting: Medical

## 2014-11-24 ENCOUNTER — Ambulatory Visit: Payer: Managed Care, Other (non HMO) | Admitting: Family

## 2014-12-14 ENCOUNTER — Telehealth: Payer: Self-pay | Admitting: Cardiovascular Disease

## 2014-12-14 NOTE — Telephone Encounter (Signed)
Agree. cdm 

## 2014-12-14 NOTE — Telephone Encounter (Signed)
Spoke with pt. She reports uncomfortable feeling in left shoulder over last few days.  No chest pain. She has not been doing a lot of increased arm movement  recently but has been working since last Tuesday and reports her job is stressful.  Does a little lifting on job.  Took ASA which helped some.  Pt has not used NTG but has at home if needed.  I explained to pt how to use NTG. I also told pt she could try Tylenol.  She states she did use Tylenol recently and pain went away after taking.  She reports she had trouble getting comfortable last night due to pain and after changing position in bed she was able to sleep.  Pt has appt with Dr. Angelena Form on January 18, 2015.  She will let us know if symptoms worsen or change prior to this appt.

## 2014-12-14 NOTE — Telephone Encounter (Signed)
New Message  Pt requested earlier appt due to "uncomfortable"feeling in arm. No open [place] appts; Pt was offered Pa/Np appt, pt requested to speak w. Rn about condition and what to do going forward. Please call back and discuss.

## 2015-01-18 ENCOUNTER — Encounter: Payer: Self-pay | Admitting: *Deleted

## 2015-01-18 ENCOUNTER — Encounter: Payer: Self-pay | Admitting: Cardiovascular Disease

## 2015-01-18 ENCOUNTER — Ambulatory Visit (INDEPENDENT_AMBULATORY_CARE_PROVIDER_SITE_OTHER): Payer: Managed Care, Other (non HMO) | Admitting: Cardiovascular Disease

## 2015-01-18 VITALS — BP 124/62 | HR 69 | Ht 65.0 in | Wt 201.4 lb

## 2015-01-18 DIAGNOSIS — I2510 Atherosclerotic heart disease of native coronary artery without angina pectoris: Secondary | ICD-10-CM | POA: Diagnosis not present

## 2015-01-18 MED ORDER — NITROGLYCERIN 0.4 MG SL SUBL: 0.4000 mg | SUBLINGUAL_TABLET | SUBLINGUAL | Status: AC | PRN

## 2015-01-18 NOTE — Patient Instructions (Signed)
Medication Instructions:  Your physician recommends that you continue on your current medications as directed. Please refer to the Current Medication list given to you today.   Labwork: None  Testing/Procedures: Your physician has requested that you have an echocardiogram. Echocardiography is a painless test that uses sound waves to create images of your heart. It provides your doctor with information about the size and shape of your heart and how well your heart's chambers and valves are working. This procedure takes approximately one hour. There are no restrictions for this procedure.  Your physician has requested that you have an exercise tolerance test. For further information please visit [email or web address]. Please also follow instruction sheet, as given.    Follow-Up: Your physician wants you to follow-up in:  6 months. You will receive a reminder letter in the mail two months in advance. If you don't receive a letter, please call our office to schedule the follow-up appointment.

## 2015-01-18 NOTE — Progress Notes (Signed)
Chief Complaint  Patient presents with  . Annual Exam  Chest pain  History of Present Illness: 54 yo AAF with history of acute inferior MI 01/12/12 with DES x 1 mid RCA, former substance abuse, hyperlipidemia, hypothyroidism here today for cardiac follow up. She underwent emergent cardiac catheterization 01/12/12 with placement of DES to RCA to the occluded mid RCA. EF 45% on LV-gram with inferior hypokinesis. Echo 01/14/12 with improvement in LVEF to 55%.    She is here today for cardiac follow up. Rare chest pains while stressed out. No SOB. No LE edema.   Primary Care Provider: [name]   Last Lipid Profile:Lipid Panel     Component Value Date/Time   CHOL 284* 04/13/2014 1052   TRIG 185* 04/13/2014 1052   HDL 37* 04/13/2014 1052   CHOLHDL 7.7 04/13/2014 1052   VLDL 37 04/13/2014 1052   LDLCALC 210* 04/13/2014 1052    Past Medical History  Diagnosis Date  . Coronary artery disease      inferior STEMI 01/13/12 s/p PTCA/DES to mid RCA. Initial EF 45% by cath but improved to 55-60% by echo 01/14/12  . GERD (gastroesophageal reflux disease)   . Cocaine abuse     Remote crack cocaine use. UDS negative for cocaine 01/2012  . Dyslipidemia     Trig 201, HDL 36, LDL 197 01/2012  . Abnormal TSH   . History of MI (myocardial infarction) 01/2012  . Alcohol abuse   . Hypothyroidism     Past Surgical History  Procedure Laterality Date  . Cardiac catheterization  2010, 2013    Negative. 2013--100% blockage  . Abdominal hysterectomy      partial  . Left heart catheterization with coronary angiogram N/A 01/12/2012    Procedure: LEFT HEART CATHETERIZATION WITH CORONARY ANGIOGRAM;  Surgeon: Burnell Blanks, MD;  Location: [facility] CATH LAB;  Service: Cardiovascular;  Laterality: N/A;    Current Outpatient Prescriptions  Medication Sig Dispense Refill  . aspirin EC 81 MG tablet Take 1 tablet (81 mg total) by mouth daily. 90 tablet 3  . docusate sodium (COLACE) 100 MG capsule Take  100 mg by mouth daily.    . nitroGLYCERIN (NITROSTAT) 0.4 MG SL tablet Place 1 tablet (0.4 mg total) under the tongue every 5 (five) minutes x 3 doses as needed for chest pain. 25 tablet 4   No current facility-administered medications for this visit.    Allergies  Allergen Reactions  . Atorvastatin     Myalgia   . Ciprofloxacin     REACTION: nause-sweating-chest tightness  . Livalo [Pitavastatin]     myalgia    History   Social History  . Marital Status: Single    Spouse Name: N/A  . Number of Children: 1  . Years of Education: N/A   Occupational History  . 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 Smoking status: Former Smoker -- 33 years    Types: Cigarettes    Quit date: 06/13/2008  . Smokeless tobacco: Never Used     Comment: None since 2009   . Alcohol Use: No     Comment: former  . Drug Use: No     Comment: Crack cocaine; none since Jan 2006   . Sexual Activity: Yes    Birth Control/ Protection: Surgical   Other Topics Concern  . Not on file   Social History Narrative   Regular exercise:  Walks 5-7 x weekly   Caffeine use:  1 daily  Manager on duty at [employer]   Single   Daughter age 58 and 32 yr old grandson- daughter lives in [place].   Enjoys reading, sewing, Fill in puzzles, walking   Completed GED.   No pets.                   Family History  Problem Relation Age of Onset  . Heart attack Mother 21    prior pacemaker  . Heart disease Mother   . Diabetes Mother   . Heart failure Father     CHF  . Heart disease Father   . Stroke Brother     Review of Systems:  As stated in the HPI and otherwise negative.   BP 124/62 mmHg  Pulse 69  Ht 5\' 5"  (1.651 m)  Wt 201 lb 6.4 oz (91.354 kg)  BMI 33.51 kg/m2  Physical Examination: General: Well developed, well nourished, NAD HEENT: OP clear, mucus membranes moist SKIN: warm, dry. No rashes. Neuro: No focal deficits Musculoskeletal: Muscle strength 5/5 all ext Psychiatric:  Mood and affect normal Neck: No JVD, no carotid bruits, no thyromegaly, no lymphadenopathy. Lungs:Clear bilaterally, no wheezes, rhonci, crackles Cardiovascular: Regular rate and rhythm. No murmurs, gallops or rubs. Abdomen:Soft. Bowel sounds present. Non-tender.  Extremities: No lower extremity edema. Pulses are 2 + in the bilateral DP/PT.  EKG:  EKG is ordered today. The ekg ordered today demonstrates NSR, rate 69 bpm. Non-specific T wave abnormality  Recent Labs: 04/13/2014: ALT 15   Lipid Panel    Component Value Date/Time   CHOL 284* 04/13/2014 1052   TRIG 185* 04/13/2014 1052   HDL 37* 04/13/2014 1052   CHOLHDL 7.7 04/13/2014 1052   VLDL 37 04/13/2014 1052   LDLCALC 210* 04/13/2014 1052     Wt Readings from Last 3 Encounters:  01/18/15 201 lb 6.4 oz (91.354 kg)  10/06/14 201 lb 3.2 oz (91.264 kg)  09/30/14 199 lb (90.266 kg)     Other studies Reviewed: Additional studies/ records that were reviewed today include:  Review of the above records demonstrates:   Cardiac cath 01/12/12: Left main: No obstructive disease.  Left Anterior Descending Artery: Large vessel that courses to the apex. Mild luminal irregularities in the mid vessel. Moderate sized diagonal branch with no disease.  Circumflex Artery: Large caliber vessel with serial 20% lesions in the mid vessel. OM1 is moderate sized with no disease. The second and third marginals are small. The fourth OM branch is moderate sized and has a 30% proximal stenosis.  Right Coronary Artery: Dominant vessel with 100% mid occlusion.  Left Ventricular Angiogram: LVEF 45%. Inferior wall hypokinesis.    Assessment and Plan:   1. CAD: Stable. No exertional chest pain. Will refill NTG SL prn. She is only taking ASA. She does not tolerate statins due to muscle aches or beta blockers due to fatigue. Will arrange exercise stress test to exclude ischemia. Will arrange echo to assess LV function.   2. HLD: She has not tolerated statin  therapy. Will consider trial of low dose statin if she is willing.   Current medicines are reviewed at length with the patient today.  The patient does not have concerns regarding medicines.  The following changes have been made:  no change  Labs/ tests ordered today include: No orders of the defined types were placed in this encounter.    Disposition:   FU with me in 6 months  Signed, Lauree Chandler, MD 01/18/2015 3:23 PM  [facility] [address], [place], [place]  [address] Phone: [phone]; Fax: [phone]

## 2015-02-15 ENCOUNTER — Encounter: Payer: Managed Care, Other (non HMO) | Admitting: Physician Assistant

## 2015-02-15 ENCOUNTER — Ambulatory Visit (INDEPENDENT_AMBULATORY_CARE_PROVIDER_SITE_OTHER): Payer: Managed Care, Other (non HMO)

## 2015-02-15 ENCOUNTER — Other Ambulatory Visit (HOSPITAL_COMMUNITY): Payer: Managed Care, Other (non HMO)

## 2015-02-15 ENCOUNTER — Ambulatory Visit (HOSPITAL_COMMUNITY): Payer: Managed Care, Other (non HMO) | Attending: Cardiovascular Disease

## 2015-02-15 ENCOUNTER — Other Ambulatory Visit: Payer: Self-pay

## 2015-02-15 DIAGNOSIS — I2510 Atherosclerotic heart disease of native coronary artery without angina pectoris: Secondary | ICD-10-CM

## 2015-02-15 DIAGNOSIS — I340 Nonrheumatic mitral (valve) insufficiency: Secondary | ICD-10-CM | POA: Diagnosis not present

## 2015-02-15 DIAGNOSIS — E785 Hyperlipidemia, unspecified: Secondary | ICD-10-CM | POA: Insufficient documentation

## 2015-02-15 DIAGNOSIS — I351 Nonrheumatic aortic (valve) insufficiency: Secondary | ICD-10-CM | POA: Insufficient documentation

## 2015-02-15 DIAGNOSIS — Z87891 Personal history of nicotine dependence: Secondary | ICD-10-CM | POA: Diagnosis not present

## 2015-02-15 DIAGNOSIS — I252 Old myocardial infarction: Secondary | ICD-10-CM | POA: Diagnosis not present

## 2015-02-22 LAB — EXERCISE TOLERANCE TEST
CHL CUP STRESS STAGE 1 HR: 68 {beats}/min
CHL CUP STRESS STAGE 1 SBP: 148 mmHg
CHL CUP STRESS STAGE 1 SPEED: 0 mph
CHL CUP STRESS STAGE 2 HR: 74 {beats}/min
CHL CUP STRESS STAGE 4 DBP: 97 mmHg
CHL CUP STRESS STAGE 4 GRADE: 10 %
CHL CUP STRESS STAGE 4 SPEED: 1.7 mph
CHL CUP STRESS STAGE 5 HR: 153 {beats}/min
CHL CUP STRESS STAGE 6 GRADE: 0 %
CHL CUP STRESS STAGE 6 SPEED: 1.5 mph
CHL CUP STRESS STAGE 7 GRADE: 0 %
CSEPED: 3 min
CSEPEW: 5.5 METS
CSEPPHR: 153 {beats}/min
CSEPPMHR: 92 %
Exercise duration (sec): 45 s
MPHR: 166 {beats}/min
Percent HR: 93 %
RPE: 15
Rest HR: 70 {beats}/min
Stage 1 DBP: 86 mmHg
Stage 1 Grade: 0 %
Stage 2 Grade: 0 %
Stage 2 Speed: 1 mph
Stage 3 Grade: 0 %
Stage 3 HR: 74 {beats}/min
Stage 3 Speed: 1 mph
Stage 4 HR: 142 {beats}/min
Stage 4 SBP: 213 mmHg
Stage 5 Grade: 12 %
Stage 5 Speed: 2.5 mph
Stage 6 DBP: 92 mmHg
Stage 6 HR: 137 {beats}/min
Stage 6 SBP: 187 mmHg
Stage 7 DBP: 86 mmHg
Stage 7 HR: 79 {beats}/min
Stage 7 SBP: 148 mmHg
Stage 7 Speed: 0 mph

## 2015-08-05 ENCOUNTER — Ambulatory Visit (INDEPENDENT_AMBULATORY_CARE_PROVIDER_SITE_OTHER): Payer: Managed Care, Other (non HMO) | Admitting: Cardiovascular Disease

## 2015-08-05 VITALS — BP 130/72 | HR 71 | Wt 201.8 lb

## 2015-08-05 DIAGNOSIS — E785 Hyperlipidemia, unspecified: Secondary | ICD-10-CM | POA: Diagnosis not present

## 2015-08-05 DIAGNOSIS — I2510 Atherosclerotic heart disease of native coronary artery without angina pectoris: Secondary | ICD-10-CM | POA: Diagnosis not present

## 2015-08-05 NOTE — Patient Instructions (Signed)
Medication Instructions:  Your physician recommends that you continue on your current medications as directed. Please refer to the Current Medication list given to you today.   Labwork:  Your physician recommends that you return for fasting lab work next Monday.  The lab opens at 7:30 AM   Testing/Procedures:  You have been referred to the lipid clinic.  Please schedule patient for new patient appt.   Follow-Up:  Your physician wants you to follow-up in: 12 months.  You will receive a reminder letter in the mail two months in advance. If you don't receive a letter, please call our office to schedule the follow-up appointment.   Any Other Special Instructions Will Be Listed Below (If Applicable).     If you need a refill on your cardiac medications before your next appointment, please call your pharmacy.

## 2015-08-05 NOTE — Progress Notes (Signed)
Chief Complaint  Patient presents with  . Follow-up    History of Present Illness: 54 yo AAF with history of acute inferior MI 01/12/12 with DES x 1 mid RCA, former substance abuse, hyperlipidemia, hypothyroidism here today for cardiac follow up. She underwent emergent cardiac catheterization 01/12/12 with placement of DES to RCA to the occluded mid RCA. EF 45% on LV-gram with inferior hypokinesis. Echo 01/14/12 with improvement in LVEF to 55%.  Most recent stress test in May 2016 with no ischemia and echo May 2016 with normal LV function, no valve disease.   She is here today for cardiac follow up. No chest pain or SOB. No LE edema.   Primary Care Provider: [name]   Past Medical History  Diagnosis Date  . Coronary artery disease      inferior STEMI 01/13/12 s/p PTCA/DES to mid RCA. Initial EF 45% by cath but improved to 55-60% by echo 01/14/12  . GERD (gastroesophageal reflux disease)   . Cocaine abuse     Remote crack cocaine use. UDS negative for cocaine 01/2012  . Dyslipidemia     Trig 201, HDL 36, LDL 197 01/2012  . Abnormal TSH   . History of MI (myocardial infarction) 01/2012  . Alcohol abuse   . Hypothyroidism     Past Surgical History  Procedure Laterality Date  . Cardiac catheterization  2010, 2013    Negative. 2013--100% blockage  . Abdominal hysterectomy      partial  . Left heart catheterization with coronary angiogram N/A 01/12/2012    Procedure: LEFT HEART CATHETERIZATION WITH CORONARY ANGIOGRAM;  Surgeon: Burnell Blanks, MD;  Location: [facility] CATH LAB;  Service: Cardiovascular;  Laterality: N/A;    Current Outpatient Prescriptions  Medication Sig Dispense Refill  . aspirin EC 81 MG tablet Take 1 tablet (81 mg total) by mouth daily. 90 tablet 3  . nitroGLYCERIN (NITROSTAT) 0.4 MG SL tablet Place 1 tablet (0.4 mg total) under the tongue every 5 (five) minutes x 3 doses as needed for chest pain. 25 tablet 6  . docusate sodium (COLACE) 100 MG capsule Take  100 mg by mouth daily.     No current facility-administered medications for this visit.    Allergies  Allergen Reactions  . Atorvastatin     Myalgia   . Ciprofloxacin     REACTION: nause-sweating-chest tightness  . Livalo [Pitavastatin]     myalgia    Social History   Social History  . Marital Status: Single    Spouse Name: N/A  . Number of Children: 1  . Years of Education: N/A   Occupational History  . 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 Smoking status: Former Smoker -- 33 years    Types: Cigarettes    Quit date: 06/13/2008  . Smokeless tobacco: Never Used     Comment: None since 2009   . Alcohol Use: No     Comment: former  . Drug Use: No     Comment: Crack cocaine; none since Jan 2006   . Sexual Activity: Yes    Birth Control/ Protection: Surgical   Other Topics Concern  . 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   Enjoys reading, sewing, Fill in  puzzles, walking   Completed GED.   No pets.                   Family History  Problem Relation Age of Onset  . Heart attack Mother 91    prior pacemaker  . Heart disease Mother   . Diabetes Mother   . Heart failure Father     CHF  . Heart disease Father   . Stroke Brother     Review of Systems:  As stated in the HPI and otherwise negative.   BP 130/72 mmHg  Pulse 71  Wt 201 lb 12.8 oz (91.536 kg)  SpO2 93%  Physical Examination: General: Well developed, well nourished, NAD HEENT: OP clear, mucus membranes moist SKIN: warm, dry. No rashes. Neuro: No focal deficits Musculoskeletal: Muscle strength 5/5 all ext Psychiatric: Mood and affect normal Neck: No JVD, no carotid bruits, no thyromegaly, no lymphadenopathy. Lungs:Clear bilaterally, no wheezes, rhonci, crackles Cardiovascular: Regular rate and  rhythm. No murmurs, gallops or rubs. Abdomen:Soft. Bowel sounds present. Non-tender.  Extremities: No lower extremity edema. Pulses are 2 + in the bilateral DP/PT.  Echo 02/15/15: - Left ventricle: The cavity size was normal. Systolic function was normal. The estimated ejection fraction was in the range of 55% to 60%. Wall motion was normal; there were no regional wall motion abnormalities. Left ventricular diastolic function parameters were normal. - Aortic valve: There was trivial regurgitation. - Mitral valve: There was mild to moderate regurgitation directed centrally. - Left atrium: The atrium was mildly dilated.   EKG:  EKG is not ordered today. The ekg ordered today demonstrates   Recent Labs: No results found for requested labs within last 365 days.   Lipid Panel    Component Value Date/Time   CHOL 284* 04/13/2014 1052   TRIG 185* 04/13/2014 1052   HDL 37* 04/13/2014 1052   CHOLHDL 7.7 04/13/2014 1052   VLDL 37 04/13/2014 1052   LDLCALC 210* 04/13/2014 1052     Wt Readings from Last 3 Encounters:  08/05/15 201 lb 12.8 oz (91.536 kg)  01/18/15 201 lb 6.4 oz (91.354 kg)  10/06/14 201 lb 3.2 oz (91.264 kg)     Other studies Reviewed: Additional studies/ records that were reviewed today include:  Review of the above records demonstrates:   Cardiac cath 01/12/12: Left main: No obstructive disease.  Left Anterior Descending Artery: Large vessel that courses to the apex. Mild luminal irregularities in the mid vessel. Moderate sized diagonal branch with no disease.  Circumflex Artery: Large caliber vessel with serial 20% lesions in the mid vessel. OM1 is moderate sized with no disease. The second and third marginals are small. The fourth OM branch is moderate sized and has a 30% proximal stenosis.  Right Coronary Artery: Dominant vessel with 100% mid occlusion.  Left Ventricular Angiogram: LVEF 45%. Inferior wall hypokinesis.    Assessment and Plan:   1.  CAD: Stable. No exertional chest pain. She is only taking ASA. She does not tolerate statins due to muscle aches or beta blockers due to fatigue. Exercise stress test May 2016 without ischemia. Echo 02/15/15 with normal LV function.   2. HLD: She has not tolerated statin therapy. She has tried Lipitor, Livalo and both caused myalgias. Her LDL July 2015 was over 200 and had been under 70 on Lipitor before it was stopped. Will refer to lipid clinic for consideration for PCSK9 inh. Will check lipids now.   Current medicines are reviewed at length with  the patient today.  The patient does not have concerns regarding medicines.  The following changes have been made:  no change  Labs/ tests ordered today include:  Orders Placed This Encounter  Procedures  . Lipid Profile    Disposition:   FU with me in 6 months  Signed, Lauree Chandler, MD 08/05/2015 4:43 PM    [facility] [address], [place], [place]  [address] Phone: [phone]; Fax: [phone]

## 2015-08-09 ENCOUNTER — Other Ambulatory Visit: Payer: Managed Care, Other (non HMO)

## 2015-08-09 ENCOUNTER — Ambulatory Visit (INDEPENDENT_AMBULATORY_CARE_PROVIDER_SITE_OTHER): Payer: Managed Care, Other (non HMO) | Admitting: Family

## 2015-08-09 ENCOUNTER — Ambulatory Visit (HOSPITAL_BASED_OUTPATIENT_CLINIC_OR_DEPARTMENT_OTHER)
Admission: RE | Admit: 2015-08-09 | Discharge: 2015-08-09 | Disposition: A | Discharge status: Home / Self Care | Payer: Managed Care, Other (non HMO) | Source: Ambulatory Visit | Attending: Family | Admitting: Family

## 2015-08-09 ENCOUNTER — Encounter: Payer: Self-pay | Admitting: Family

## 2015-08-09 ENCOUNTER — Encounter (INDEPENDENT_AMBULATORY_CARE_PROVIDER_SITE_OTHER): Payer: Self-pay

## 2015-08-09 VITALS — BP 122/86 | HR 59 | Temp 97.9°F | Resp 16 | Ht 65.0 in | Wt 201.2 lb

## 2015-08-09 DIAGNOSIS — R109 Unspecified abdominal pain: Secondary | ICD-10-CM | POA: Diagnosis not present

## 2015-08-09 DIAGNOSIS — E785 Hyperlipidemia, unspecified: Secondary | ICD-10-CM | POA: Diagnosis not present

## 2015-08-09 DIAGNOSIS — R140 Abdominal distension (gaseous): Secondary | ICD-10-CM | POA: Insufficient documentation

## 2015-08-09 DIAGNOSIS — R110 Nausea: Secondary | ICD-10-CM

## 2015-08-09 DIAGNOSIS — K429 Umbilical hernia without obstruction or gangrene: Secondary | ICD-10-CM

## 2015-08-09 LAB — LIPID PANEL
CHOL/HDL RATIO: 7
Cholesterol: 267 mg/dL — ABNORMAL HIGH (ref 0–200)
HDL: 36.3 mg/dL — AB (ref >39.00)
NONHDL: 230.62
TRIGLYCERIDES: 214 mg/dL — AB (ref 0.0–149.0)
VLDL: 42.8 mg/dL — AB (ref 0.0–40.0)

## 2015-08-09 LAB — COMPREHENSIVE METABOLIC PANEL
ALBUMIN: 3.9 g/dL (ref 3.5–5.2)
ALK PHOS: 79 U/L (ref 39–117)
ALT: 20 U/L (ref 0–35)
AST: 19 U/L (ref 0–37)
BUN: 10 mg/dL (ref 6–23)
CO2: 25 mEq/L (ref 19–32)
CREATININE: 0.73 mg/dL (ref 0.40–1.20)
Calcium: 9.7 mg/dL (ref 8.4–10.5)
Chloride: 106 mEq/L (ref 96–112)
GFR: 106.45 mL/min (ref >60.00)
GLUCOSE: 79 mg/dL (ref 70–99)
POTASSIUM: 4.2 meq/L (ref 3.5–5.1)
SODIUM: 139 meq/L (ref 135–145)
TOTAL PROTEIN: 7.5 g/dL (ref 6.0–8.3)
Total Bilirubin: 0.2 mg/dL (ref 0.2–1.2)

## 2015-08-09 LAB — CBC WITH DIFFERENTIAL/PLATELET
BASOS ABS: 0.1 10*3/uL (ref 0.0–0.1)
Basophils Relative: 0.7 % (ref 0.0–3.0)
EOS ABS: 0.2 10*3/uL (ref 0.0–0.7)
Eosinophils Relative: 2 % (ref 0.0–5.0)
HCT: 40.7 % (ref 36.0–46.0)
HEMOGLOBIN: 12.8 g/dL (ref 12.0–15.0)
LYMPHS ABS: 4.2 10*3/uL — AB (ref 0.7–4.0)
Lymphocytes Relative: 55.1 % — ABNORMAL HIGH (ref 12.0–46.0)
MCHC: 31.5 g/dL (ref 30.0–36.0)
MCV: 82.1 fl (ref 78.0–100.0)
MONO ABS: 0.4 10*3/uL (ref 0.1–1.0)
Monocytes Relative: 5.5 % (ref 3.0–12.0)
NEUTROS PCT: 36.7 % — AB (ref 43.0–77.0)
Neutro Abs: 2.8 10*3/uL (ref 1.4–7.7)
Platelets: 311 10*3/uL (ref 150.0–400.0)
RBC: 4.96 Mil/uL (ref 3.87–5.11)
RDW: 15.6 % — ABNORMAL HIGH (ref 11.5–15.5)
WBC: 7.6 10*3/uL (ref 4.0–10.5)

## 2015-08-09 LAB — LDL CHOLESTEROL, DIRECT: Direct LDL: 172 mg/dL

## 2015-08-09 NOTE — Patient Instructions (Addendum)
Call me with name of surgeon you wish to see.   Complete lab work prior to leaving. Complete x ray on the first floor. Go to ER if you develop vomiting, worsening abdominal pain.    Hernia, Adult A hernia is the bulging of an organ or tissue through a weak spot in the muscles of the abdomen (abdominal wall). Hernias develop most often near the navel or groin. There are many kinds of hernias. Common kinds include:  Femoral hernia. This kind of hernia develops under the groin in the upper thigh area.  Inguinal hernia. This kind of hernia develops in the groin or scrotum.  Umbilical hernia. This kind of hernia develops near the navel.  Hiatal hernia. This kind of hernia causes part of the stomach to be pushed up into the chest.  Incisional hernia. This kind of hernia bulges through a scar from an abdominal surgery. CAUSES This condition may be caused by:  Heavy lifting.  Coughing over a long period of time.  Straining to have a bowel movement.  An incision made during an abdominal surgery.  A birth defect (congenital defect).  Excess weight or obesity.  Smoking.  Poor nutrition.  Cystic fibrosis.  Excess fluid in the abdomen.  Undescended testicles. SYMPTOMS Symptoms of a hernia include:  A lump on the abdomen. This is the first sign of a hernia. The lump may become more obvious with standing, straining, or coughing. It may get bigger over time if it is not treated or if the condition causing it is not treated.  Pain. A hernia is usually painless, but it may become painful over time if treatment is delayed. The pain is usually dull and may get worse with standing or lifting heavy objects. Sometimes a hernia gets tightly squeezed in the weak spot (strangulated) or stuck there (incarcerated) and causes additional symptoms. These symptoms may include:  Vomiting.  Nausea.  Constipation.  Irritability. DIAGNOSIS A hernia may be diagnosed with:  A physical exam.  During the exam your health care provider may ask you to cough or to make a specific movement, because a hernia is usually more visible when you move.  Imaging tests. These can include:  X-rays.  Ultrasound.  CT scan. TREATMENT A hernia that is small and painless may not need to be treated. A hernia that is large or painful may be treated with surgery. Inguinal hernias may be treated with surgery to prevent incarceration or strangulation. Strangulated hernias are always treated with surgery, because lack of blood to the trapped organ or tissue can cause it to die. Surgery to treat a hernia involves pushing the bulge back into place and repairing the weak part of the abdomen. HOME CARE INSTRUCTIONS  Avoid straining.  Do not lift anything heavier than 10 lb (4.5 kg).  Lift with your leg muscles, not your back muscles. This helps avoid strain.  When coughing, try to cough gently.  Prevent constipation. Constipation leads to straining with bowel movements, which can make a hernia worse or cause a hernia repair to break down. You can prevent constipation by:  Eating a high-fiber diet that includes plenty of fruits and vegetables.  Drinking enough fluids to keep your urine clear or pale yellow. Aim to drink 6-8 glasses of water per day.  Using a stool softener as directed by your health care provider.  Lose weight, if you are overweight.  Do not use any tobacco products, including cigarettes, chewing tobacco, or electronic cigarettes. If you need help  quitting, ask your health care provider.  Keep all follow-up visits as directed by your health care provider. This is important. Your health care provider may need to monitor your condition. SEEK MEDICAL CARE IF:  You have swelling, redness, and pain in the affected area.  Your bowel habits change. SEEK IMMEDIATE MEDICAL CARE IF:  You have a fever.  You have abdominal pain that is getting worse.  You feel nauseous or you  vomit.  You cannot push the hernia back in place by gently pressing on it while you are lying down.  The hernia:  Changes in shape or size.  Is stuck outside the abdomen.  Becomes discolored.  Feels hard or tender.   This information is not intended to replace advice given to you by your health care provider. Make sure you discuss any questions you have with your health care provider.   Document Released: 09/25/2005 Document Revised: 10/16/2014 Document Reviewed: 08/05/2014 Elsevier Interactive Patient Education [employer].

## 2015-08-09 NOTE — Progress Notes (Signed)
Pre visit review using our clinic review tool, if applicable. No additional management support is needed unless otherwise documented below in the visit note. 

## 2015-08-09 NOTE — Assessment & Plan Note (Signed)
KUB negative for obstruction, does note constipation. Will rx with miralax, CBC, cmet due to nausea. Pt advised to go to the ER if she develops severe/worsening abdominal pain, or inability to keep down food/drink. Refer to surgeon this week.  She has a Psychologist, sport and exercise in mind but left the name at home. Advised her to email me a Pharmacist, community message with name of the surgeon she wishes to see.

## 2015-08-09 NOTE — Progress Notes (Signed)
Subjective:    Patient ID: Kaitlyn Bowman, female    DOB: Feb 19, 1961, 54 y.o.   MRN: [id number]  HPI  Kaitlyn Bowman is a 54 yr old female who presents today with chief complaint of umbilical hernia. Reports increased periumbilical pain x 3 months.  She has had this hernia for some time.  It was noted on CT abd/pelvis back in 2014. She saw Dr. Coralie Keens for this hernia back in 8/14 and repair with mesh was recommended. Pt stated that she did not follow through with the surgery at that time due to an outstanding debt with their practice that she was unable to pay off.   She is requesting a referral to another surgical group.  She reports + nausea but has been tolerating PO's and moving her bowels normally. Reports that she vomited 1 week ago.    Review of Systems See HPI  Past Medical History  Diagnosis Date  . Coronary artery disease      inferior STEMI 01/13/12 s/p PTCA/DES to mid RCA. Initial EF 45% by cath but improved to 55-60% by echo 01/14/12  . GERD (gastroesophageal reflux disease)   . Cocaine abuse     Remote crack cocaine use. UDS negative for cocaine 01/2012  . Dyslipidemia     Trig 201, HDL 36, LDL 197 01/2012  . Abnormal TSH   . History of MI (myocardial infarction) 01/2012  . Alcohol abuse   . Hypothyroidism     Social History   Social History  . Marital Status: Single    Spouse Name: N/A  . Number of Children: 1  . Years of Education: N/A   Occupational History  . 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 Smoking status: Former Smoker -- 33 years    Types: Cigarettes    Quit date: 06/13/2008  . Smokeless tobacco: Never Used     Comment: None since 2009   . Alcohol Use: No     Comment: former  . Drug Use: No     Comment: Crack cocaine; none since Jan 2006   . Sexual Activity: Yes    Birth Control/ Protection: Surgical   Other Topics Concern  . 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   Enjoys reading, sewing, Fill in puzzles, walking   Completed GED.   No pets.                   Past Surgical History  Procedure Laterality Date  . Cardiac catheterization  2010, 2013    Negative. 2013--100% blockage  . Abdominal hysterectomy      partial  . Left heart catheterization with coronary angiogram N/A 01/12/2012    Procedure: LEFT HEART CATHETERIZATION WITH CORONARY ANGIOGRAM;  Surgeon: Burnell Blanks, MD;  Location: [facility] CATH LAB;  Service: Cardiovascular;  Laterality: N/A;    Family History  Problem Relation Age of Onset  . Heart attack Mother 69    prior pacemaker  . Heart disease Mother   . Diabetes Mother   . Heart failure Father     CHF  . Heart disease Father   . Stroke Brother     Allergies  Allergen Reactions  . Atorvastatin  Myalgia   . Ciprofloxacin     REACTION: nause-sweating-chest tightness  . Livalo [Pitavastatin]     myalgia    Current Outpatient Prescriptions on File Prior to Visit  Medication Sig Dispense Refill  . aspirin EC 81 MG tablet Take 1 tablet (81 mg total) by mouth daily. 90 tablet 3  . nitroGLYCERIN (NITROSTAT) 0.4 MG SL tablet Place 1 tablet (0.4 mg total) under the tongue every 5 (five) minutes x 3 doses as needed for chest pain. 25 tablet 6  . docusate sodium (COLACE) 100 MG capsule Take 100 mg by mouth daily.     No current facility-administered medications on file prior to visit.    BP 122/86 mmHg  Pulse 59  Temp(Src) 97.9 F (36.6 C) (Oral)  Resp 16  Ht 5\' 5"  (1.651 m)  Wt 201 lb 3.2 oz (91.264 kg)  BMI 33.48 kg/m2       Objective:   Physical Exam  Constitutional: She is oriented to person, place, and time. She appears well-developed and well-nourished.  HENT:  Head: Normocephalic and atraumatic.  Cardiovascular: Normal rate, regular rhythm and normal heart sounds.   No murmur  heard. Pulmonary/Chest: Effort normal and breath sounds normal. No respiratory distress. She has no wheezes.  Abdominal: Soft. Bowel sounds are normal.  + umbilical hernia- tender to touch, non-reducible.    Musculoskeletal: She exhibits no edema.  Neurological: She is alert and oriented to person, place, and time.  Psychiatric: She has a normal mood and affect. Her behavior is normal. Judgment and thought content normal.          Assessment & Plan:  Hyperlipidemia- pt is requesting lipid panel drawn today.  Will order.

## 2015-08-10 ENCOUNTER — Ambulatory Visit (INDEPENDENT_AMBULATORY_CARE_PROVIDER_SITE_OTHER): Payer: Managed Care, Other (non HMO) | Admitting: Pharmacist

## 2015-08-10 ENCOUNTER — Other Ambulatory Visit (INDEPENDENT_AMBULATORY_CARE_PROVIDER_SITE_OTHER): Payer: Managed Care, Other (non HMO)

## 2015-08-10 ENCOUNTER — Encounter: Payer: Self-pay | Admitting: Family

## 2015-08-10 DIAGNOSIS — E785 Hyperlipidemia, unspecified: Secondary | ICD-10-CM | POA: Diagnosis not present

## 2015-08-10 MED ORDER — ROSUVASTATIN CALCIUM 10 MG PO TABS: ORAL_TABLET | ORAL | Status: DC

## 2015-08-10 NOTE — Telephone Encounter (Signed)
Pt called in to just her referral status. Please advise    CB: [phone]

## 2015-08-10 NOTE — Telephone Encounter (Signed)
Referral was faxed to [facility] today and they should contact pt directly to schedule appt.  Notified pt and she voices understanding.

## 2015-08-10 NOTE — Patient Instructions (Signed)
Start taking fish oil 2g daily (buy the 1,000mg  capsules and take 2 a day). You can keep them in the freezer to help with the fishy taste.  Pick up Crestor (rosuvastatin) and take 1 tablet (10mg ) 3 times a week. Call lipid clinic [phone] if you start to get muscle aches and we will start paperwork for the cholesterol injections.  Diet changes: try bananas and oatmeal for breakfast and bringing lunch to work. Switch to low salt baked chips and applesauce for snacks.

## 2015-08-10 NOTE — Progress Notes (Signed)
Patient ID: Kaitlyn Bowman                 DOB: Apr 19, 1961, 54 yo                         MRN: [id number]     HPI: Kaitlyn Bowman is a 53 y.o. female patient referred to lipid clinic by Dr. Angelena Form. 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 Most recent stress test in May 2016 with no ischemia and echo with normal LV function, no valve disease. Patient has intolerances to 3 statins and Zetia and presents today for further lipid management.   Current Medications: none Intolerances: Lipitor 80mg , Livalo 4mg , pravastatin 80mg , Zetia 10mg  - myalgias in legs Risk Factors: ASCVD - prior MI with PCA, LDL > 170mg /dL LDL goal: 70mg /dL for secondary prevention, non-HDL goal 100mg /dL  Diet:  Breakfast: [employer] sausage biscuit with hashbrown Lunch: 3 piece fried chicken with potatoes and regular coca cola, sausage Snacks: potato chips, candy bars, low fat ice cream  Exercise: Walks during work on a regular basis.   Family History: Mother with MI at 83, heart disease, and DM. Father with CHF and heart disease, brother with stroke.  Social History: Pt has history of cocaine and alcohol abuse. Pt quit smoking in 2009.  Labs: 07/2015: TC 267, TG 214, HDL 36.3, LDL-D 172 (no therapy) 04/2014: TC 284, TG 185, HDL 37, LDL 210 (pt not sure, potentially on Zetia then per refill history)  Past Medical History  Diagnosis Date  . Coronary artery disease      inferior STEMI 01/13/12 s/p PTCA/DES to mid RCA. Initial EF 45% by cath but improved to 55-60% by echo 01/14/12  . GERD (gastroesophageal reflux disease)   . Cocaine abuse     Remote crack cocaine use. UDS negative for cocaine 01/2012  . Dyslipidemia     Trig 201, HDL 36, LDL 197 01/2012  . Abnormal TSH   . History of MI (myocardial infarction) 01/2012  . Alcohol abuse   . Hypothyroidism     Current Outpatient Prescriptions on File Prior to Visit  Medication Sig Dispense Refill  .  aspirin EC 81 MG tablet Take 1 tablet (81 mg total) by mouth daily. 90 tablet 3  . docusate sodium (COLACE) 100 MG capsule Take 100 mg by mouth daily.    . nitroGLYCERIN (NITROSTAT) 0.4 MG SL tablet Place 1 tablet (0.4 mg total) under the tongue every 5 (five) minutes x 3 doses as needed for chest pain. 25 tablet 6   No current facility-administered medications on file prior to visit.    Allergies  Allergen Reactions  . Atorvastatin     Myalgia   . Ciprofloxacin     REACTION: nause-sweating-chest tightness  . Livalo [Pitavastatin]     myalgia    Assessment/Plan:  1. Hyperlipidemia - Patient with ASCVD and LDL above goal 70mg /dL at 172 and TG above goal 150mg /dL at 214. She is not currently on lipid-lowering therapy and is intolerant to Zetia and 3 statins at their maximum doses. Patient hesitant to start any new medications as she wants to focus on dietary changes. However, with LDL so high above goal, discussed with patient that medication will likely be necessary. Since pt has only tried max dose statins, will start Crestor 10mg  3 times a week. Pt will also focus on reducing fried food and fast  food in her diet. For elevated TG, will start fish oil 2g daily and pt will focus on reducing sugar in her daily diet. Pt given instructions to call clinic if she develops myalgias on Crestor, will initiate paperwork for Praluent at that time. Discussed mechanisms of action, benefits, and adverse effects of statins vs. PCSK9 inhibitors. Pt will also call in ~1 month if she is doing well with the Crestor so that 3 month labs can be set up. Pt in agreement with plan.    Megan E. Supple, PharmD [facility] [address] [place], [place] [address] Phone: [phone]; Fax: [phone] 08/10/2015 11:19 AM

## 2015-08-11 ENCOUNTER — Encounter: Payer: Self-pay | Admitting: Family

## 2015-10-10 HISTORY — PX: HERNIA REPAIR: SHX51

## 2015-11-05 ENCOUNTER — Telehealth: Payer: Self-pay | Admitting: Behavioral Health

## 2015-11-05 NOTE — Telephone Encounter (Signed)
Assessed the following care gaps: Mammogram Pap (Hx of hysterectomy) Colon Flu (patient declines per health maintenance)  Patient declines having a mammogram & colonoscopy completed at this time. However, patient voiced that she would like to still see her PCP and will call on Monday to make an appointment.

## 2015-11-15 ENCOUNTER — Encounter: Payer: Self-pay | Admitting: Family

## 2015-11-15 ENCOUNTER — Telehealth: Payer: Self-pay | Admitting: Family

## 2015-11-15 ENCOUNTER — Ambulatory Visit (INDEPENDENT_AMBULATORY_CARE_PROVIDER_SITE_OTHER): Payer: Managed Care, Other (non HMO) | Admitting: Family

## 2015-11-15 VITALS — BP 125/74 | HR 63 | Temp 98.4°F | Resp 16 | Ht 65.0 in | Wt 205.6 lb

## 2015-11-15 DIAGNOSIS — E785 Hyperlipidemia, unspecified: Secondary | ICD-10-CM | POA: Diagnosis not present

## 2015-11-15 DIAGNOSIS — J019 Acute sinusitis, unspecified: Secondary | ICD-10-CM | POA: Diagnosis not present

## 2015-11-15 MED ORDER — AMOXICILLIN-POT CLAVULANATE 875-125 MG PO TABS: 1.0000 | ORAL_TABLET | Freq: Two times a day (BID) | ORAL | Status: DC

## 2015-11-15 NOTE — Telephone Encounter (Signed)
Kaitlyn Bowman, I saw Kaitlyn Bowman today. She has been off crestor x 2 months. Did not tolerate due to nausea.  I wanted to let you know, not sure if she may be a candidate for PCSK9 inhibitors?  Thanks, [employer]

## 2015-11-15 NOTE — Telephone Encounter (Signed)
Thanks for the message - I scheduled Kaitlyn Bowman for f/u in lipid clinic. She is definitely a candidate for PCSK9i - we discussed at her last visit and she was hesitant...hopefully will have better luck at this visit.

## 2015-11-15 NOTE — Assessment & Plan Note (Signed)
Intolerant to statin, notified Lipid Clinic so that they may be able to consider PCSK9 inhibitors.

## 2015-11-15 NOTE — Progress Notes (Signed)
Subjective:    Patient ID: Kaitlyn Bowman, female    DOB: Feb 17, 1961, 55 y.o.   MRN: [id number]  HPI  Kaitlyn Bowman is a 55 yr old female who presents today with c/o sinus pressure, bilateral ear pain and itching. Symptoms begain 1 week ago. Patient reports that her nasal drainage is yellow, thicker in the AM.  She has tried some benadryl with some improvement but caused her "dryness."  She denies known fever, or cough.   Hyperlipidemia- saw lipid clinic.  She  Lab Results  Component Value Date   CHOL 267* 08/09/2015   HDL 36.30* 08/09/2015   LDLCALC 210* 04/13/2014   LDLDIRECT 172.0 08/09/2015   TRIG 214.0* 08/09/2015   CHOLHDL 7 08/09/2015   Pt reports that she stopped crestor due to nausea.    Review of Systems    see HPI  Past Medical History  Diagnosis Date  . Coronary artery disease      inferior STEMI 01/13/12 s/p PTCA/DES to mid RCA. Initial EF 45% by cath but improved to 55-60% by echo 01/14/12  . GERD (gastroesophageal reflux disease)   . Cocaine abuse     Remote crack cocaine use. UDS negative for cocaine 01/2012  . Dyslipidemia     Trig 201, HDL 36, LDL 197 01/2012  . Abnormal TSH   . History of MI (myocardial infarction) 01/2012  . Alcohol abuse   . Hypothyroidism     Social History   Social History  . Marital Status: Single    Spouse Name: N/A  . Number of Children: 1  . Years of Education: N/A   Occupational History  . 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 Smoking status: Former Smoker -- 33 years    Types: Cigarettes    Quit date: 06/13/2008  . Smokeless tobacco: Never Used     Comment: None since 2009   . Alcohol Use: No     Comment: former  . Drug Use: No     Comment: Crack cocaine; none since Jan 2006   . Sexual Activity: Yes    Birth Control/ Protection: Surgical   Other Topics Concern  . 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   Enjoys reading, sewing, Fill in puzzles, walking   Completed GED.   No pets.                   Past Surgical History  Procedure Laterality Date  . Cardiac catheterization  2010, 2013    Negative. 2013--100% blockage  . Abdominal hysterectomy      partial  . Left heart catheterization with coronary angiogram N/A 01/12/2012    Procedure: LEFT HEART CATHETERIZATION WITH CORONARY ANGIOGRAM;  Surgeon: Burnell Blanks, MD;  Location[facility] CATH LAB;  Service: Cardiovascular;  Laterality: N/A;    Family History  Problem Relation Age of Onset  . Heart attack Mother 24    prior pacemaker  . Heart disease Mother   . Diabetes Mother   . Heart failure Father     CHF  . Heart disease Father   . Stroke Brother     Allergies  Allergen Reactions  . Atorvastatin     Myalgia   . Ciprofloxacin  REACTION: nause-sweating-chest tightness  . Crestor [Rosuvastatin Calcium] Nausea Only  . Livalo [Pitavastatin]     myalgia    Current Outpatient Prescriptions on File Prior to Visit  Medication Sig Dispense Refill  . aspirin EC 81 MG tablet Take 1 tablet (81 mg total) by mouth daily. 90 tablet 3  . nitroGLYCERIN (NITROSTAT) 0.4 MG SL tablet Place 1 tablet (0.4 mg total) under the tongue every 5 (five) minutes x 3 doses as needed for chest pain. 25 tablet 6   No current facility-administered medications on file prior to visit.    BP 125/74 mmHg  Pulse 63  Temp(Src) 98.4 F (36.9 C) (Oral)  Resp 16  Ht 5\' 5"  (1.651 m)  Wt 205 lb 9.6 oz (93.26 kg)  BMI 34.21 kg/m2  SpO2 100%    Objective:   Physical Exam  Constitutional: She appears well-developed and well-nourished.  HENT:  Right Ear: Tympanic membrane and ear canal normal.  Left Ear: Tympanic membrane and ear canal normal.  Nose: Right sinus exhibits no maxillary sinus tenderness and no frontal sinus tenderness. Left sinus exhibits no  maxillary sinus tenderness and no frontal sinus tenderness.  Cardiovascular: Normal rate, regular rhythm and normal heart sounds.   No murmur heard. Pulmonary/Chest: Effort normal and breath sounds normal. No respiratory distress. She has no wheezes.  Psychiatric: She has a normal mood and affect. Her behavior is normal. Judgment and thought content normal.          Assessment & Plan:  Acute sinusitis- rx with augmentin and claritin, advised pt on nasal sinus irrigation.

## 2015-11-15 NOTE — Progress Notes (Signed)
Pre visit review using our clinic review tool, if applicable. No additional management support is needed unless otherwise documented below in the visit note. 

## 2015-11-15 NOTE — Patient Instructions (Addendum)
Add claritin once daily. Start augmentin for sinus infection. Call if your symptoms worsen or do not improve.

## 2015-11-29 ENCOUNTER — Ambulatory Visit: Payer: Managed Care, Other (non HMO) | Admitting: Pharmacist

## 2015-12-13 ENCOUNTER — Ambulatory Visit: Payer: Managed Care, Other (non HMO) | Admitting: Pharmacist

## 2015-12-20 ENCOUNTER — Ambulatory Visit: Payer: Managed Care, Other (non HMO) | Admitting: Pharmacist

## 2015-12-20 ENCOUNTER — Telehealth: Payer: Self-pay | Admitting: Cardiovascular Disease

## 2015-12-20 NOTE — Telephone Encounter (Signed)
Spoke with pt. 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 This involved pulling, stretching and folding. She does not usually work this long in [employer]. She thinks she "over did it".  When she got home last night had soreness in middle and right chest and tingling in left arm.  She used a muscle rub ointment on chest and took tylenol last night. This helped the soreness. Soreness today when she does movement like throwing a pillow on the bed this morning.  Took a shower and the warm water helped the soreness.  She reports this pain is not like the pain she had when she had MI. She does have NTG and I told her she could try this to see if helps the pain.  I told her she could also take another dose of tylenol.  I told her this sounded like it was muscular and I asked her to follow up with primary care. She knows to go to ED if symptoms worsen

## 2015-12-20 NOTE — Telephone Encounter (Signed)
Agree. cdm 

## 2015-12-20 NOTE — Telephone Encounter (Signed)
New Message  Pt called states that she went to work and worked for 6 hours. She states that she is ok she thinks but she has tingling in her arm and a pulling or  soreness in her chest. No chest pain. Please call back to discuss.

## 2015-12-27 ENCOUNTER — Ambulatory Visit: Payer: Managed Care, Other (non HMO) | Admitting: Pharmacist

## 2016-01-10 ENCOUNTER — Ambulatory Visit (INDEPENDENT_AMBULATORY_CARE_PROVIDER_SITE_OTHER): Payer: Managed Care, Other (non HMO) | Admitting: Pharmacist

## 2016-01-10 DIAGNOSIS — E785 Hyperlipidemia, unspecified: Secondary | ICD-10-CM | POA: Diagnosis not present

## 2016-01-10 NOTE — Progress Notes (Signed)
Patient ID: TENESIA BIERNACKI DOB: [birth year], 55 yo MRN: [id number]     HPI: Kaitlyn Bowman is a 55 y.o. female patient referred to lipid clinic by Dr. Angelena Form. 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 Most recent stress test in May 2016 with no ischemia and echo with normal LV function, no valve disease. Patient has intolerances to 3 statins and Zetia and presents today for further lipid management.   Current Medications: none Intolerances: Lipitor 80mg , Livalo 4mg , pravastatin 80mg , Zetia 10mg  - myalgias in legs, Crestor 10 mg 3 times a week - decreased appetite and muscle aches (resolved when stopped) Risk Factors: ASCVD - prior MI with PCA, LDL > 170mg /dL LDL goal: 70mg /dL for secondary prevention, non-HDL goal 100mg /dL   Diet: Reports cutting fried foods for past 2 months from her diet.  She reports eating oatmeal and bananas in the AM as well as eating fresh fruits throughout the day. She ate fried chicken once a few days ago and did not feel well after.  Family History: Mother with MI at 49, heart disease, and DM. Father with CHF and heart disease, brother with stroke.  Social History: Pt has history of cocaine and alcohol abuse. Pt quit smoking in 2009.   Labs: Rechecking lipid panel today, patient has fasted. 07/2015: TC 267, TG 214, HDL 36.3, LDL-D 172 (no therapy) 04/2014: TC 284, TG 185, HDL 37, LDL 210 (pt not sure, potentially on Zetia then per refill history)  Past Medical History  Diagnosis Date  . Coronary artery disease      inferior STEMI 01/13/12 s/p PTCA/DES to mid RCA. Initial EF 45% by cath but improved to 55-60% by echo 01/14/12  . GERD (gastroesophageal reflux disease)   . Cocaine abuse     Remote crack cocaine use. UDS negative for cocaine 01/2012  . Dyslipidemia     Trig 201, HDL 36, LDL 197 01/2012  . Abnormal TSH   . History of MI (myocardial infarction) 01/2012  .  Alcohol abuse   . Hypothyroidism     Current Outpatient Prescriptions on File Prior to Visit  Medication Sig Dispense Refill  . amoxicillin-clavulanate (AUGMENTIN) 875-125 MG tablet Take 1 tablet by mouth 2 (two) times daily. 20 tablet 0  . aspirin EC 81 MG tablet Take 1 tablet (81 mg total) by mouth daily. 90 tablet 3  . nitroGLYCERIN (NITROSTAT) 0.4 MG SL tablet Place 1 tablet (0.4 mg total) under the tongue every 5 (five) minutes x 3 doses as needed for chest pain. 25 tablet 6   No current facility-administered medications on file prior to visit.    Allergies  Allergen Reactions  . Atorvastatin     Myalgia   . Ciprofloxacin     REACTION: nause-sweating-chest tightness  . Crestor [Rosuvastatin Calcium] Nausea Only  . Livalo [Pitavastatin]     myalgia    Assessment/Plan:  1. Hyperlipidiemia - Patient with ASCVD and LDL above goal < 70 mg/dL. She is intolerant to Zetia and maximum dose Crestor, Lipitor, and Livalo, as well as Crestor 10 mg three times per week. Patient has had lifestyle changes with a focus on reducing fried foods from her diet.  Discussed mechanisms of action, benefits/efficacy, and adverse effects of statins vs. PCSK9 inhibitors. Will obtain fasting lipid panel today and start paperwork for Praluent. Once we prior authorization approved, will start Praluent 75 mg SQ once every 2 weeks. Counseled patient on expected  benefit, administration, side effects, and storage of Praluent.

## 2016-01-10 NOTE — Patient Instructions (Signed)
We will recheck your cholesterol today and start paperwork for Praluent injections. Call Tylia Ewell in the lipid clinic with any  questions [phone].

## 2016-01-11 LAB — LIPID PANEL
CHOL/HDL RATIO: 7.5 ratio — AB (ref <=5.0)
CHOLESTEROL: 279 mg/dL — AB (ref 125–200)
HDL: 37 mg/dL — AB (ref >=46)
LDL Cholesterol: 210 mg/dL — ABNORMAL HIGH (ref <130)
Triglycerides: 159 mg/dL — ABNORMAL HIGH (ref <150)
VLDL: 32 mg/dL — ABNORMAL HIGH (ref <30)

## 2016-01-12 ENCOUNTER — Telehealth: Payer: Self-pay | Admitting: Pharmacist

## 2016-01-12 MED ORDER — EVOLOCUMAB 140 MG/ML ~~LOC~~ SOAJ: 1.0000 "pen " | SUBCUTANEOUS | Status: DC

## 2016-01-12 NOTE — Telephone Encounter (Signed)
Repatha approved with [place]. Rx sent to [facility]. Pt will call when she receives her first shipment so that we can set up lab work.

## 2016-01-31 ENCOUNTER — Telehealth: Payer: Self-pay | Admitting: Cardiovascular Disease

## 2016-01-31 DIAGNOSIS — E785 Hyperlipidemia, unspecified: Secondary | ICD-10-CM

## 2016-01-31 NOTE — Telephone Encounter (Signed)
New message      Talk to [facility]

## 2016-01-31 NOTE — Telephone Encounter (Signed)
Returned patient's call. Gave her direct number to [facility] so she can set up shipment for [place]. She has an upcoming hernia repair procedure on 5/10 and wants to wait until after this is done before starting her injections. Set up lab work for mid-July after patient has had time to give at least 3 injections.

## 2016-03-27 ENCOUNTER — Emergency Department (HOSPITAL_BASED_OUTPATIENT_CLINIC_OR_DEPARTMENT_OTHER): Payer: Managed Care, Other (non HMO)

## 2016-03-27 ENCOUNTER — Emergency Department (HOSPITAL_BASED_OUTPATIENT_CLINIC_OR_DEPARTMENT_OTHER)
Admission: EM | Admit: 2016-03-27 | Discharge: 2016-03-27 | Disposition: A | Discharge status: Home / Self Care | Payer: Managed Care, Other (non HMO) | Attending: Emergency Medicine | Admitting: Emergency Medicine

## 2016-03-27 ENCOUNTER — Encounter (HOSPITAL_BASED_OUTPATIENT_CLINIC_OR_DEPARTMENT_OTHER): Payer: Self-pay | Admitting: Emergency Medicine

## 2016-03-27 DIAGNOSIS — R0981 Nasal congestion: Secondary | ICD-10-CM

## 2016-03-27 DIAGNOSIS — E785 Hyperlipidemia, unspecified: Secondary | ICD-10-CM | POA: Diagnosis not present

## 2016-03-27 DIAGNOSIS — Z7982 Long term (current) use of aspirin: Secondary | ICD-10-CM | POA: Diagnosis not present

## 2016-03-27 DIAGNOSIS — E039 Hypothyroidism, unspecified: Secondary | ICD-10-CM | POA: Insufficient documentation

## 2016-03-27 DIAGNOSIS — J069 Acute upper respiratory infection, unspecified: Secondary | ICD-10-CM | POA: Diagnosis not present

## 2016-03-27 DIAGNOSIS — I2510 Atherosclerotic heart disease of native coronary artery without angina pectoris: Secondary | ICD-10-CM | POA: Diagnosis not present

## 2016-03-27 DIAGNOSIS — Z87891 Personal history of nicotine dependence: Secondary | ICD-10-CM | POA: Diagnosis not present

## 2016-03-27 LAB — RAPID STREP SCREEN (MED CTR MEBANE ONLY): STREPTOCOCCUS, GROUP A SCREEN (DIRECT): NEGATIVE

## 2016-03-27 MED ORDER — AZITHROMYCIN 250 MG PO TABS: ORAL_TABLET | ORAL | Status: DC

## 2016-03-27 MED ORDER — LORATADINE 10 MG PO TABS
10.0000 mg | ORAL_TABLET | Freq: Once | ORAL | Status: AC
  Administered 2016-03-27: 10 mg via ORAL
  Filled 2016-03-27: qty 1

## 2016-03-27 MED ORDER — MOMETASONE FUROATE 50 MCG/ACT NA SUSP: 2.0000 | Freq: Every day | NASAL | Status: DC

## 2016-03-27 MED ORDER — KETOROLAC TROMETHAMINE 60 MG/2ML IM SOLN
60.0000 mg | Freq: Once | INTRAMUSCULAR | Status: AC
  Administered 2016-03-27: 60 mg via INTRAMUSCULAR
  Filled 2016-03-27: qty 2

## 2016-03-27 MED ORDER — LORATADINE 10 MG PO TABS: 10.0000 mg | ORAL_TABLET | Freq: Every day | ORAL | Status: DC

## 2016-03-27 MED ORDER — OXYMETAZOLINE HCL 0.05 % NA SOLN
1.0000 | Freq: Once | NASAL | Status: AC
  Administered 2016-03-27: 1 via NASAL
  Filled 2016-03-27: qty 15

## 2016-03-27 MED ORDER — IBUPROFEN 600 MG PO TABS: 600.0000 mg | ORAL_TABLET | Freq: Four times a day (QID) | ORAL | Status: DC | PRN

## 2016-03-27 MED FILL — AZITHROMYCIN 250 MG TABLET: 250 | 5 days supply | Qty: 6 | Fill #0

## 2016-03-27 NOTE — Discharge Instructions (Signed)
Upper Respiratory Infection, Adult Most upper respiratory infections (URIs) are a viral infection of the air passages leading to the lungs. A URI affects the nose, throat, and upper air passages. The most common type of URI is nasopharyngitis and is typically referred to as "the common cold." URIs run their course and usually go away on their own. Most of the time, a URI does not require medical attention, but sometimes a bacterial infection in the upper airways can follow a viral infection. This is called a secondary infection. Sinus and middle ear infections are common types of secondary upper respiratory infections. Bacterial pneumonia can also complicate a URI. A URI can worsen asthma and chronic obstructive pulmonary disease (COPD). Sometimes, these complications can require emergency medical care and may be life threatening.  CAUSES Almost all URIs are caused by viruses. A virus is a type of germ and can spread from one person to another.  RISKS FACTORS You may be at risk for a URI if:   You smoke.   You have chronic heart or lung disease.  You have a weakened defense (immune) system.   You are very young or very old.   You have nasal allergies or asthma.  You work in crowded or poorly ventilated areas.  You work in health care facilities or schools. SIGNS AND SYMPTOMS  Symptoms typically develop 2-3 days after you come in contact with a cold virus. Most viral URIs last 7-10 days. However, viral URIs from the influenza virus (flu virus) can last 14-18 days and are typically more severe. Symptoms may include:   Runny or stuffy (congested) nose.   Sneezing.   Cough.   Sore throat.   Headache.   Fatigue.   Fever.   Loss of appetite.   Pain in your forehead, behind your eyes, and over your cheekbones (sinus pain).  Muscle aches.  DIAGNOSIS  Your health care provider may diagnose a URI by:  Physical exam.  Tests to check that your symptoms are not due to  another condition such as:  Strep throat.  Sinusitis.  Pneumonia.  Asthma. TREATMENT  A URI goes away on its own with time. It cannot be cured with medicines, but medicines may be prescribed or recommended to relieve symptoms. Medicines may help:  Reduce your fever.  Reduce your cough.  Relieve nasal congestion. HOME CARE INSTRUCTIONS   Take medicines only as directed by your health care provider.   Gargle warm saltwater or take cough drops to comfort your throat as directed by your health care provider.  Use a warm mist humidifier or inhale steam from a shower to increase air moisture. This may make it easier to breathe.  Drink enough fluid to keep your urine clear or pale yellow.   Eat soups and other clear broths and maintain good nutrition.   Rest as needed.   Return to work when your temperature has returned to normal or as your health care provider advises. You may need to stay home longer to avoid infecting others. You can also use a face mask and careful hand washing to prevent spread of the virus.  Increase the usage of your inhaler if you have asthma.   Do not use any tobacco products, including cigarettes, chewing tobacco, or electronic cigarettes. If you need help quitting, ask your health care provider. PREVENTION  The best way to protect yourself from getting a cold is to practice good hygiene.   Avoid oral or hand contact with people with cold  use any tobacco products, including cigarettes, chewing tobacco, or electronic cigarettes. If you need help quitting, ask your health care provider.  PREVENTION   The best way to protect yourself from getting a cold is to practice good hygiene.    Avoid oral or hand contact with people with cold symptoms.    Wash your hands often if contact occurs.   There is no clear evidence that vitamin C, vitamin E, echinacea, or exercise reduces the chance of developing a cold. However, it is always recommended to get plenty of rest, exercise, and practice good nutrition.   SEEK MEDICAL CARE IF:    You are getting worse rather than better.    Your symptoms are not controlled by medicine.    You have chills.   You have worsening shortness of breath.   You have brown or red mucus.   You have yellow or brown nasal  discharge.   You have pain in your face, especially when you bend forward.   You have a fever.   You have swollen neck glands.   You have pain while swallowing.   You have white areas in the back of your throat.  SEEK IMMEDIATE MEDICAL CARE IF:    You have severe or persistent:    Headache.    Ear pain.    Sinus pain.    Chest pain.   You have chronic lung disease and any of the following:    Wheezing.    Prolonged cough.    Coughing up blood.    A change in your usual mucus.   You have a stiff neck.   You have changes in your:    Vision.    Hearing.    Thinking.    Mood.  MAKE SURE YOU:    Understand these instructions.   Will watch your condition.   Will get help right away if you are not doing well or get worse.     This information is not intended to replace advice given to you by your health care provider. Make sure you discuss any questions you have with your health care provider.     Document Released: 03/21/2001 Document Revised: 02/09/2015 Document Reviewed: 12/31/2013  Elsevier Interactive Patient Education 2016 [employer]      Sinusitis, Adult  Sinusitis is redness, soreness, and inflammation of the paranasal sinuses. Paranasal sinuses are air pockets within the bones of your face. They are located beneath your eyes, in the middle of your forehead, and above your eyes. In healthy paranasal sinuses, mucus is able to drain out, and air is able to circulate through them by way of your nose. However, when your paranasal sinuses are inflamed, mucus and air can become trapped. This can allow bacteria and other germs to grow and cause infection.  Sinusitis can develop quickly and last only a short time (acute) or continue over a long period (chronic). Sinusitis that lasts for more than 12 weeks is considered chronic.  CAUSES  Causes of sinusitis include:   Allergies.   Structural abnormalities, such as displacement of the cartilage that separates your nostrils (deviated septum), which can  decrease the air flow through your nose and sinuses and affect sinus drainage.   Functional abnormalities, such as when the small hairs (cilia) that line your sinuses and help remove mucus do not work properly or are not present.  SIGNS AND SYMPTOMS  Symptoms of acute and chronic sinusitis are the same. The primary symptoms are pain and   pressure around the affected sinuses. Other symptoms include:   Upper toothache.   Earache.   Headache.   Bad breath.   Decreased sense of smell and taste.   A cough, which worsens when you are lying flat.   Fatigue.   Fever.   Thick drainage from your nose, which often is green and may contain pus (purulent).   Swelling and warmth over the affected sinuses.  DIAGNOSIS  Your health care provider will perform a physical exam. During your exam, your health care provider may perform any of the following to help determine if you have acute sinusitis or chronic sinusitis:   Look in your nose for signs of abnormal growths in your nostrils (nasal polyps).   Tap over the affected sinus to check for signs of infection.   View the inside of your sinuses using an imaging device that has a light attached (endoscope).  If your health care provider suspects that you have chronic sinusitis, one or more of the following tests may be recommended:   Allergy tests.   Nasal culture. A sample of mucus is taken from your nose, sent to a lab, and screened for bacteria.   Nasal cytology. A sample of mucus is taken from your nose and examined by your health care provider to determine if your sinusitis is related to an allergy.  TREATMENT  Most cases of acute sinusitis are related to a viral infection and will resolve on their own within 10 days. Sometimes, medicines are prescribed to help relieve symptoms of both acute and chronic sinusitis. These may include pain medicines, decongestants, nasal steroid sprays, or saline sprays.  However, for sinusitis related to a bacterial infection, your  health care provider will prescribe antibiotic medicines. These are medicines that will help kill the bacteria causing the infection.  Rarely, sinusitis is caused by a fungal infection. In these cases, your health care provider will prescribe antifungal medicine.  For some cases of chronic sinusitis, surgery is needed. Generally, these are cases in which sinusitis recurs more than 3 times per year, despite other treatments.  HOME CARE INSTRUCTIONS   Drink plenty of water. Water helps thin the mucus so your sinuses can drain more easily.   Use a humidifier.   Inhale steam 3-4 times a day (for example, sit in the bathroom with the shower running).   Apply a warm, moist washcloth to your face 3-4 times a day, or as directed by your health care provider.   Use saline nasal sprays to help moisten and clean your sinuses.   Take medicines only as directed by your health care provider.   If you were prescribed either an antibiotic or antifungal medicine, finish it all even if you start to feel better.  SEEK IMMEDIATE MEDICAL CARE IF:   You have increasing pain or severe headaches.   You have nausea, vomiting, or drowsiness.   You have swelling around your face.   You have vision problems.   You have a stiff neck.   You have difficulty breathing.     This information is not intended to replace advice given to you by your health care provider. Make sure you discuss any questions you have with your health care provider.     Document Released: 09/25/2005 Document Revised: 10/16/2014 Document Reviewed: 10/10/2011  Elsevier Interactive Patient Education 2016 Elsevier Inc.

## 2016-03-27 NOTE — ED Provider Notes (Signed)
CSN: [id number]     Arrival date & time 03/27/16  1109 History   First MD Initiated Contact with Patient 03/27/16 1135     Chief Complaint  Patient presents with  . URI     (Consider location/radiation/quality/duration/timing/severity/associated sxs/prior Treatment) HPI Patient presents with 2 days of nasal congestion, sinus pressure, sore throat, nonproductive cough, bilateral ear pain, mild shortness of breath and body aches. States she's had subjective fever. No sick contacts. Patient taking over-the-counter ibuprofen with some relief. Past Medical History  Diagnosis Date  . Coronary artery disease      inferior STEMI 01/13/12 s/p PTCA/DES to mid RCA. Initial EF 45% by cath but improved to 55-60% by echo 01/14/12  . GERD (gastroesophageal reflux disease)   . Cocaine abuse     Remote crack cocaine use. UDS negative for cocaine 01/2012  . Dyslipidemia     Trig 201, HDL 36, LDL 197 01/2012  . Abnormal TSH   . History of MI (myocardial infarction) 01/2012  . Alcohol abuse   . Hypothyroidism    Past Surgical History  Procedure Laterality Date  . Cardiac catheterization  2010, 2013    Negative. 2013--100% blockage  . Abdominal hysterectomy      partial  . Left heart catheterization with coronary angiogram N/A 01/12/2012    Procedure: LEFT HEART CATHETERIZATION WITH CORONARY ANGIOGRAM;  Surgeon: Burnell Blanks, MD;  Location: [facility] C[facility] LAB;  Service: Cardiovascular;  Laterality: N/A;  . Hernia repair     Family History  Problem Relation Age of Onset  . Heart attack Mother 49    prior pacemaker  . Heart disease Mother   . Diabetes Mother   . Heart failure Father     CHF  . Heart disease Father   . Stroke Brother    Social History  Substance Use Topics  . Smoking status: Former Smoker -- 33 years    Types: Cigarettes    Quit date: 06/13/2008  . Smokeless tobacco: Never Used     Comment: None since 2009   . Alcohol Use: No     Comment: former   OB History    No data  available     Review of Systems  Constitutional: Positive for fever, chills and fatigue.  HENT: Positive for congestion, ear pain, rhinorrhea, sinus pressure and sore throat.   Respiratory: Positive for cough and shortness of breath. Negative for chest tightness and wheezing.   Cardiovascular: Negative for chest pain, palpitations and leg swelling.  Gastrointestinal: Negative for nausea, vomiting, abdominal pain, diarrhea and constipation.  Musculoskeletal: Positive for myalgias. Negative for back pain, neck pain and neck stiffness.  Skin: Negative for rash and wound.  Neurological: Positive for headaches. Negative for dizziness, weakness, light-headedness and numbness.  All other systems reviewed and are negative.     Allergies  Atorvastatin; Ciprofloxacin; Crestor; and Livalo  Home Medications   Prior to Admission medications   Medication Sig Start Date End Date Taking? Authorizing Provider  aspirin EC 81 MG tablet Take 1 tablet (81 mg total) by mouth daily. 06/16/13   Burnell Blanks, MD  azithromycin (ZITHROMAX Z-PAK) 250 MG tablet 2 po day one, then 1 daily x 4 days 03/27/16   Julianne Rice, MD  Evolocumab (REPATHA SURECLICK) XX123456 MG/ML SOAJ Inject 1 pen into the skin every 14 (fourteen) days. 01/12/16   Burnell Blanks, MD  ibuprofen (ADVIL,MOTRIN) 600 MG tablet Take 1 tablet (600 mg total) by mouth every 6 (six) hours as  needed. 03/27/16   Julianne Rice, MD  loratadine (CLARITIN) 10 MG tablet Take 1 tablet (10 mg total) by mouth daily. 03/27/16   Julianne Rice, MD  mometasone (NASONEX) 50 MCG/ACT nasal spray Place 2 sprays into the nose daily. 03/27/16   Julianne Rice, MD  nitroGLYCERIN (NITROSTAT) 0.4 MG SL tablet Place 1 tablet (0.4 mg total) under the tongue every 5 (five) minutes x 3 doses as needed for chest pain. 01/18/15   Burnell Blanks, MD   BP 139/75 mmHg  Pulse 81  Temp(Src) 98.7 F (37.1 C) (Oral)  Resp 18  Ht 5\' 6"  (1.676 m)  Wt 198 lb  (89.812 kg)  BMI 31.97 kg/m2  SpO2 95% Physical Exam  Constitutional: She is oriented to person, place, and time. She appears well-developed and well-nourished. No distress.  HENT:  Head: Normocephalic and atraumatic.  Mouth/Throat: Oropharynx is clear and moist.  Bilateral nasal mucosal edema. Tenderness to percussion over the left maxillary sinus. Bilateral bulging TMs without erythema. Oropharynx with bilateral tonsillar hypertrophy and erythema. No exudates present.  Eyes: EOM are normal. Pupils are equal, round, and reactive to light.  Neck: Normal range of motion. Neck supple.  No meningismus  Cardiovascular: Normal rate and regular rhythm.   Pulmonary/Chest: Effort normal and breath sounds normal. No respiratory distress. She has no wheezes. She has no rales. She exhibits no tenderness.  Abdominal: Soft. Bowel sounds are normal. She exhibits no distension and no mass. There is no tenderness. There is no rebound and no guarding.  Musculoskeletal: Normal range of motion. She exhibits no edema or tenderness.  Lymphadenopathy:    She has no cervical adenopathy.  Neurological: She is alert and oriented to person, place, and time.  Moves all extremities without deficit. Sensation is fully intact.  Skin: Skin is warm and dry. No rash noted. No erythema.  Psychiatric: She has a normal mood and affect. Her behavior is normal.  Nursing note and vitals reviewed.   ED Course  Procedures (including critical care time) Labs Review Labs Reviewed  RAPID STREP SCREEN (NOT AT [facility])  CULTURE, GROUP A STREP [facility])    Imaging Review Dg Chest 2 View  03/27/2016  CLINICAL DATA:  55 year old female with a history of productive cough with fever EXAM: CHEST  2 VIEW COMPARISON:  Chest x-ray 03/28/2013, 09/13/2012 FINDINGS: Cardiomediastinal silhouette within normal limits in size and contour. No pneumothorax. No pleural effusion. No confluent airspace disease. Similar ill-defined interstitial  opacities in the right infrahilar region, likely atelectasis/ scarring. No displaced fracture. IMPRESSION: No radiographic evidence of acute cardiopulmonary disease. Signed, Dulcy Fanny. Earleen Newport, DO [facility] Electronically Signed   By: Corrie Mckusick D.O.   On: 03/27/2016 12:17   I have personally reviewed and evaluated these images and lab results as part of my medical decision-making.   EKG Interpretation None      MDM   Final diagnoses:  Sinus congestion  URI (upper respiratory infection)    Patient very well-appearing. Chest x-ray without evidence of pneumonia. Advised to follow-up with her primary physician. Return precautions given.    Julianne Rice, MD 03/27/16 1314

## 2016-03-27 NOTE — ED Notes (Signed)
Pt states she has had cough, sore throat and sob

## 2016-03-30 LAB — CULTURE, GROUP A STREP (THRC)

## 2016-04-17 ENCOUNTER — Other Ambulatory Visit: Payer: Managed Care, Other (non HMO)

## 2016-07-05 ENCOUNTER — Ambulatory Visit: Payer: Managed Care, Other (non HMO) | Admitting: Physician Assistant

## 2016-07-05 ENCOUNTER — Emergency Department (HOSPITAL_BASED_OUTPATIENT_CLINIC_OR_DEPARTMENT_OTHER)
Admission: EM | Admit: 2016-07-05 | Discharge: 2016-07-05 | Disposition: A | Discharge status: Home / Self Care | Payer: Managed Care, Other (non HMO) | Attending: Emergency Medicine | Admitting: Emergency Medicine

## 2016-07-05 ENCOUNTER — Encounter (HOSPITAL_BASED_OUTPATIENT_CLINIC_OR_DEPARTMENT_OTHER): Payer: Self-pay

## 2016-07-05 DIAGNOSIS — Z87891 Personal history of nicotine dependence: Secondary | ICD-10-CM | POA: Diagnosis not present

## 2016-07-05 DIAGNOSIS — L723 Sebaceous cyst: Secondary | ICD-10-CM | POA: Diagnosis present

## 2016-07-05 DIAGNOSIS — E039 Hypothyroidism, unspecified: Secondary | ICD-10-CM | POA: Insufficient documentation

## 2016-07-05 DIAGNOSIS — D179 Benign lipomatous neoplasm, unspecified: Secondary | ICD-10-CM | POA: Diagnosis not present

## 2016-07-05 DIAGNOSIS — Z7982 Long term (current) use of aspirin: Secondary | ICD-10-CM | POA: Diagnosis not present

## 2016-07-05 DIAGNOSIS — I2510 Atherosclerotic heart disease of native coronary artery without angina pectoris: Secondary | ICD-10-CM | POA: Insufficient documentation

## 2016-07-05 NOTE — ED Triage Notes (Signed)
C/o "knot" to mid back and to left arm x 4 days-NAD-steady gait

## 2016-07-05 NOTE — ED Provider Notes (Signed)
[facility] DEPT [facility] Provider Note   CSN: [id number] Arrival date & time: 07/05/16  1620     History   Chief Complaint Chief Complaint  Patient presents with  . Cyst    HPI Kaitlyn Bowman is a 55 y.o. female.  HPI   89yF with mass to R mid thoracic region. Unsure how long it may have been there but more noticeable since she lost weight after hernia surgery this year. Not really painful unless something presses on it and then it is uncomfortable.   Past Medical History:  Diagnosis Date  . Abnormal TSH   . Alcohol abuse   . Cocaine abuse    Remote crack cocaine use. UDS negative for cocaine 01/2012  . Coronary artery disease     inferior STEMI 01/13/12 s/p PTCA/DES to mid RCA. Initial EF 45% by cath but improved to 55-60% by echo 01/14/12  . Dyslipidemia    Trig 201, HDL 36, LDL 197 01/2012  . GERD (gastroesophageal reflux disease)   . History of MI (myocardial infarction) 01/2012  . Hypothyroidism     Patient Active Problem List   Diagnosis Date Noted  . Back pain 10/06/2014  . Wrist pain, right 09/30/2014  . Acute bacterial bronchitis 09/01/2014  . Grief reaction 07/13/2014  . Plantar fasciitis 05/11/2014  . Left leg pain 05/11/2014  . Menopausal syndrome 05/19/2013  . Umbilical hernia [id number]  . Special screening for malignant neoplasms, colon 04/15/2012  . General medical examination 02/21/2012  . CAD (coronary artery disease) 01/30/2012  . Seasonal allergies 01/24/2012  . Inferior MI-status post stenting 4/13 01/14/2012  . Hyperlipidemia 01/14/2012  . Hypothyroidism 01/14/2012  . SUBSTANCE ABUSE 07/06/2010    Past Surgical History:  Procedure Laterality Date  . ABDOMINAL HYSTERECTOMY     partial  . CARDIAC CATHETERIZATION  2010, 2013   Negative. 2013--100% blockage  . HERNIA REPAIR    . LEFT HEART CATHETERIZATION WITH CORONARY ANGIOGRAM N/A 01/12/2012   Procedure: LEFT HEART CATHETERIZATION WITH CORONARY ANGIOGRAM;  Surgeon: Burnell Blanks, MD;   Location: [facility] CATH LAB;  Service: Cardiovascular;  Laterality: N/A;    OB History    No data available       Home Medications    Prior to Admission medications   Medication Sig Start Date End Date Taking? Authorizing Provider  aspirin EC 81 MG tablet Take 1 tablet (81 mg total) by mouth daily. 06/16/13   Burnell Blanks, MD  loratadine (CLARITIN) 10 MG tablet Take 1 tablet (10 mg total) by mouth daily. 03/27/16   Julianne Rice, MD  mometasone (NASONEX) 50 MCG/ACT nasal spray Place 2 sprays into the nose daily. 03/27/16   Julianne Rice, MD  nitroGLYCERIN (NITROSTAT) 0.4 MG SL tablet Place 1 tablet (0.4 mg total) under the tongue every 5 (five) minutes x 3 doses as needed for chest pain. 01/18/15   Burnell Blanks, MD    Family History Family History  Problem Relation Age of Onset  . Heart attack Mother 45    prior pacemaker  . Heart disease Mother   . Diabetes Mother   . Heart failure Father     CHF  . Heart disease Father   . Stroke Brother     Social History Social History  Substance Use Topics  . Smoking status: Former Smoker    Years: 33.00    Types: Cigarettes    Quit date: 06/13/2008  . Smokeless tobacco: Never Used     Comment: None since  2009   . Alcohol use No     Comment: =     Allergies   Atorvastatin; Ciprofloxacin; Crestor [rosuvastatin calcium]; and Livalo [pitavastatin]   Review of Systems Review of Systems  All systems reviewed and negative, other than as noted in HPI.   Physical Exam Updated Vital Signs BP 136/78 (BP Location: Left Arm)   Pulse 60   Temp 98.5 F (36.9 C) (Oral)   Resp 18   Ht 5\' 6"  (1.676 m)   Wt 197 lb (89.4 kg)   SpO2 98%   BMI 31.80 kg/m   Physical Exam  Constitutional: She appears well-developed and well-nourished. No distress.  HENT:  Head: Normocephalic and atraumatic.  Eyes: Conjunctivae are normal. Right eye exhibits no discharge. Left eye exhibits no discharge.  Neck: Neck supple.    Cardiovascular: Normal rate, regular rhythm and normal heart sounds.  Exam reveals no gallop and no friction rub.   No murmur heard. Pulmonary/Chest: Effort normal and breath sounds normal. No respiratory distress.  Abdominal: Soft. She exhibits no distension. There is no tenderness.  Musculoskeletal: She exhibits no edema or tenderness.  Approximately 4 x 3 cm ovoid mass to the right scapular region. Firm and smooth feel. No overlying skin changes. It seems mobile. In subcutaneous tissue but seems separate from the posterior chest wall. Nontender to palpation.  Neurological: She is alert.  Skin: Skin is warm and dry.  Psychiatric: She has a normal mood and affect. Her behavior is normal. Thought content normal.  Nursing note and vitals reviewed.    ED Treatments / Results  Labs (all labs ordered are listed, but only abnormal results are displayed) Labs Reviewed - No data to display  EKG  EKG Interpretation None       Radiology No results found.  Procedures Procedures (including critical care time)  Medications Ordered in ED Medications - No data to display   Initial Impression / Assessment and Plan / ED Course  I have reviewed the triage vital signs and the nursing notes.  Pertinent labs & imaging results that were available during my care of the patient were reviewed by me and considered in my medical decision making (see chart for details).  Clinical Course    55 year old female with mass to right scapular region. Suspect that this is lipoma. She reports recent weight loss I suspect that this is why she is now noticing it more. There are no overlying skin changes. Return precautions were discussed. If this continues to bother her or she develops new symptoms she can be rechecked.  Final Clinical Impressions(s) / ED Diagnoses   Final diagnoses:  Lipoma    New Prescriptions New Prescriptions   No medications on file     Virgel Manifold, MD 07/11/16 1007

## 2016-07-11 ENCOUNTER — Ambulatory Visit (HOSPITAL_BASED_OUTPATIENT_CLINIC_OR_DEPARTMENT_OTHER)
Admission: RE | Admit: 2016-07-11 | Discharge: 2016-07-11 | Disposition: A | Discharge status: Home / Self Care | Payer: Managed Care, Other (non HMO) | Source: Ambulatory Visit | Attending: Family | Admitting: Family

## 2016-07-11 ENCOUNTER — Ambulatory Visit (INDEPENDENT_AMBULATORY_CARE_PROVIDER_SITE_OTHER): Payer: Managed Care, Other (non HMO) | Admitting: Family

## 2016-07-11 ENCOUNTER — Encounter: Payer: Self-pay | Admitting: Family

## 2016-07-11 VITALS — BP 133/77 | HR 62 | Temp 98.1°F | Resp 16 | Ht 65.0 in | Wt 194.6 lb

## 2016-07-11 DIAGNOSIS — R229 Localized swelling, mass and lump, unspecified: Secondary | ICD-10-CM | POA: Diagnosis not present

## 2016-07-11 DIAGNOSIS — IMO0002 Reserved for concepts with insufficient information to code with codable children: Secondary | ICD-10-CM

## 2016-07-11 DIAGNOSIS — E785 Hyperlipidemia, unspecified: Secondary | ICD-10-CM | POA: Diagnosis not present

## 2016-07-11 NOTE — Addendum Note (Signed)
Addended by: [name] on: 07/11/2016 02:22 PM   Modules accepted: Orders

## 2016-07-11 NOTE — Progress Notes (Signed)
Pre visit review using our clinic review tool, if applicable. No additional management support is needed unless otherwise documented below in the visit note. 

## 2016-07-11 NOTE — Progress Notes (Signed)
Subjective:    Patient ID: Kaitlyn Bowman, female    DOB: [birth year], 55 y.o.   MRN: [id number]  HPI   Ms.  Garrod is a 55 yr old female who presents toady for ER follow up. ER record is reviewed. She was evaluated on 07/05/16 due to a mass in the right scapular region.  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   Hyperlipidemia- intolerant to statin, she has been evaluated in the lipid clinic and has been approved for PCSK9.  She is still undecided if she wants to proceed.  Lab Results  Component Value Date   CHOL 279 (H) 01/10/2016   HDL 37 (L) 01/10/2016   LDLCALC 210 (H) 01/10/2016   LDLDIRECT 172.0 08/09/2015   TRIG 159 (H) 01/10/2016   CHOLHDL 7.5 (H) 01/10/2016    Review of Systems    see HPI  Past Medical History:  Diagnosis Date  . Abnormal TSH   . Alcohol abuse   . Cocaine abuse    Remote crack cocaine use. UDS negative for cocaine 01/2012  . Coronary artery disease     inferior STEMI 01/13/12 s/p PTCA/DES to mid RCA. Initial EF 45% by cath but improved to 55-60% by echo 01/14/12  . Dyslipidemia    Trig 201, HDL 36, LDL 197 01/2012  . GERD (gastroesophageal reflux disease)   . History of MI (myocardial infarction) 01/2012  . Hypothyroidism      Social History   Social History  . Marital status: Single    Spouse name: N/A  . Number of children: 1  . Years of education: N/A   Occupational History  . 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 Smoking status: Former Smoker    Years: 33.00    Types: Cigarettes    Quit date: 06/13/2008  . Smokeless tobacco: Never Used     Comment: None since 2009   . Alcohol use No     Comment: =  . Drug use: No     Comment: Crack cocaine; none since Jan 2006   . Sexual activity: Not on file   Other Topics Concern  . 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   Enjoys reading, sewing, Fill in puzzles, walking   Completed GED.   No pets.                   Past Surgical History:  Procedure Laterality Date  . ABDOMINAL HYSTERECTOMY     partial  . CARDIAC CATHETERIZATION  2010, 2013   Negative. 2013--100% blockage  . HERNIA REPAIR    . LEFT HEART CATHETERIZATION WITH CORONARY ANGIOGRAM N/A 01/12/2012   Procedure: LEFT HEART CATHETERIZATION WITH CORONARY ANGIOGRAM;  Surgeon: Burnell Blanks, MD;  Location: [facility] CATH LAB;  Service: Cardiovascular;  Laterality: N/A;    Family History  Problem Relation Age of Onset  . Heart attack Mother 78    prior pacemaker  . Heart disease Mother   . Diabetes Mother   . Heart failure Father     CHF  . Heart disease Father   . Stroke Brother     Allergies  Allergen Reactions  . Atorvastatin     Myalgia   .  Ciprofloxacin     REACTION: nause-sweating-chest tightness  . Crestor [Rosuvastatin Calcium] Nausea Only  . Livalo [Pitavastatin]     myalgia    Current Outpatient Prescriptions on File Prior to Visit  Medication Sig Dispense Refill  . aspirin EC 81 MG tablet Take 1 tablet (81 mg total) by mouth daily. 90 tablet 3  . loratadine (CLARITIN) 10 MG tablet Take 1 tablet (10 mg total) by mouth daily. (Patient not taking: Reported on 07/11/2016) 30 tablet 0  . mometasone (NASONEX) 50 MCG/ACT nasal spray Place 2 sprays into the nose daily. (Patient not taking: Reported on 07/11/2016) 17 g 12  . nitroGLYCERIN (NITROSTAT) 0.4 MG SL tablet Place 1 tablet (0.4 mg total) under the tongue every 5 (five) minutes x 3 doses as needed for chest pain. (Patient not taking: Reported on 07/11/2016) 25 tablet 6   No current facility-administered medications on file prior to visit.     BP 133/77 (BP Location: Left Arm, Cuff Size: Normal)   Pulse 62   Temp 98.1 F (36.7 C) (Oral)   Resp 16   Ht 5\' 5"  (1.651 m)   Wt 194 lb 9.6 oz (88.3 kg)   SpO2 98% Comment: room air  BMI  32.38 kg/m    Objective:   Physical Exam  Constitutional: She is oriented to person, place, and time. She appears well-developed and well-nourished.  Cardiovascular: Normal rate, regular rhythm and normal heart sounds.   No murmur heard. Pulmonary/Chest: Effort normal and breath sounds normal. No respiratory distress. She has no wheezes.  Musculoskeletal: She exhibits no edema.       Arms: Firm, non-tender mass noted right posterior arm and beneath the right scapula  Neurological: She is alert and oriented to person, place, and time.  Skin: Skin is warm and dry.  Psychiatric: She has a normal mood and affect. Her behavior is normal. Judgment and thought content normal.          Assessment & Plan:  Mass- New. will obtain US of the two masses noted above. Suspect lipoma.  Hyperlipidemia- discussed her cardiovascular risk and encouraged her to proceed with PCSK9 injection. She will think about it and will contact the lipid clinic. We discussed importance of continuing aspirin.   Declines flu shot.

## 2016-07-11 NOTE — Patient Instructions (Addendum)
Please call the lipid clinic to speak to [facility] re: cholesterol injection [phone]. Go to the first floor imaging department for your ultrasounds.

## 2016-08-17 ENCOUNTER — Emergency Department (HOSPITAL_BASED_OUTPATIENT_CLINIC_OR_DEPARTMENT_OTHER)
Admission: EM | Admit: 2016-08-17 | Discharge: 2016-08-17 | Disposition: A | Discharge status: Home / Self Care | Payer: Managed Care, Other (non HMO) | Attending: Emergency Medicine | Admitting: Emergency Medicine

## 2016-08-17 ENCOUNTER — Encounter (HOSPITAL_BASED_OUTPATIENT_CLINIC_OR_DEPARTMENT_OTHER): Payer: Self-pay | Admitting: *Deleted

## 2016-08-17 ENCOUNTER — Emergency Department (HOSPITAL_BASED_OUTPATIENT_CLINIC_OR_DEPARTMENT_OTHER): Payer: Managed Care, Other (non HMO)

## 2016-08-17 ENCOUNTER — Telehealth: Payer: Self-pay | Admitting: Cardiovascular Disease

## 2016-08-17 DIAGNOSIS — Z7982 Long term (current) use of aspirin: Secondary | ICD-10-CM | POA: Diagnosis not present

## 2016-08-17 DIAGNOSIS — Z87891 Personal history of nicotine dependence: Secondary | ICD-10-CM | POA: Diagnosis not present

## 2016-08-17 DIAGNOSIS — R079 Chest pain, unspecified: Secondary | ICD-10-CM | POA: Insufficient documentation

## 2016-08-17 DIAGNOSIS — E039 Hypothyroidism, unspecified: Secondary | ICD-10-CM | POA: Diagnosis not present

## 2016-08-17 DIAGNOSIS — I2510 Atherosclerotic heart disease of native coronary artery without angina pectoris: Secondary | ICD-10-CM | POA: Insufficient documentation

## 2016-08-17 DIAGNOSIS — R0789 Other chest pain: Secondary | ICD-10-CM | POA: Diagnosis present

## 2016-08-17 LAB — COMPREHENSIVE METABOLIC PANEL
ALT: 20 U/L (ref 14–54)
AST: 20 U/L (ref 15–41)
Albumin: 4.2 g/dL (ref 3.5–5.0)
Alkaline Phosphatase: 69 U/L (ref 38–126)
Anion gap: 7 (ref 5–15)
BILIRUBIN TOTAL: 0.4 mg/dL (ref 0.3–1.2)
BUN: 11 mg/dL (ref 6–20)
CHLORIDE: 107 mmol/L (ref 101–111)
CO2: 26 mmol/L (ref 22–32)
CREATININE: 0.85 mg/dL (ref 0.44–1.00)
Calcium: 9.8 mg/dL (ref 8.9–10.3)
Glucose, Bld: 84 mg/dL (ref 65–99)
Potassium: 4.4 mmol/L (ref 3.5–5.1)
Sodium: 140 mmol/L (ref 135–145)
TOTAL PROTEIN: 7.8 g/dL (ref 6.5–8.1)

## 2016-08-17 LAB — CBC WITH DIFFERENTIAL/PLATELET
BASOS ABS: 0 10*3/uL (ref 0.0–0.1)
Basophils Relative: 0 %
EOS PCT: 2 %
Eosinophils Absolute: 0.1 10*3/uL (ref 0.0–0.7)
HEMATOCRIT: 37 % (ref 36.0–46.0)
Hemoglobin: 11.9 g/dL — ABNORMAL LOW (ref 12.0–15.0)
LYMPHS ABS: 4.8 10*3/uL — AB (ref 0.7–4.0)
LYMPHS PCT: 61 %
MCH: 25.6 pg — AB (ref 26.0–34.0)
MCHC: 32.2 g/dL (ref 30.0–36.0)
MCV: 79.7 fL (ref 78.0–100.0)
MONO ABS: 0.4 10*3/uL (ref 0.1–1.0)
Monocytes Relative: 5 %
NEUTROS ABS: 2.5 10*3/uL (ref 1.7–7.7)
Neutrophils Relative %: 32 %
PLATELETS: 334 10*3/uL (ref 150–400)
RBC: 4.64 MIL/uL (ref 3.87–5.11)
RDW: 15.1 % (ref 11.5–15.5)
WBC: 7.9 10*3/uL (ref 4.0–10.5)

## 2016-08-17 LAB — TROPONIN I: Troponin I: <0.03 ng/mL (ref <0.03)

## 2016-08-17 NOTE — ED Triage Notes (Signed)
Pt reports feeling mild chest pressure x 1 day. States she is now having pian down her left arm. Hx of MI

## 2016-08-17 NOTE — ED Notes (Signed)
Patient transported to X-ray 

## 2016-08-17 NOTE — Telephone Encounter (Signed)
LMTCB

## 2016-08-17 NOTE — ED Provider Notes (Signed)
[facility] DEPT [facility] Provider Note   CSN: [id number] Arrival date & time: 08/17/16  1412     History   Chief Complaint Chief Complaint  Patient presents with  . Chest Pain    HPI Kaitlyn Bowman is a 55 y.o. female.  Patient is a 55 year old female with history of coronary artery disease with stent 1 approximately 5 years ago. She presents today for evaluation of chest discomfort that started shortly after 7 AM. She works at [employer]. Shortly after arriving at work this morning she inhaled some paint fumes which cause her to feel tight in her upper chest. She denies any nausea, shortness of breath, or diaphoresis. This occurred intermittently throughout the day and she presents today for evaluation of this. She is currently pain-free. She denies any recent exertional symptoms.  Her cardiologist is Dr. Julianne Handler   The history is provided by the patient.  Chest Pain   This is a new problem. Episode onset: Just after 7 AM. Episode frequency: Intermittently. The problem has been resolved. The pain is mild.    Past Medical History:  Diagnosis Date  . Abnormal TSH   . Alcohol abuse   . Cocaine abuse    Remote crack cocaine use. UDS negative for cocaine 01/2012  . Coronary artery disease     inferior STEMI 01/13/12 s/p PTCA/DES to mid RCA. Initial EF 45% by cath but improved to 55-60% by echo 01/14/12  . Dyslipidemia    Trig 201, HDL 36, LDL 197 01/2012  . GERD (gastroesophageal reflux disease)   . History of MI (myocardial infarction) 01/2012  . Hypothyroidism     Patient Active Problem List   Diagnosis Date Noted  . Back pain 10/06/2014  . Wrist pain, right 09/30/2014  . Acute bacterial bronchitis 09/01/2014  . Grief reaction 07/13/2014  . Plantar fasciitis 05/11/2014  . Left leg pain 05/11/2014  . Menopausal syndrome 05/19/2013  . Umbilical hernia [id number]  . Special screening for malignant neoplasms, colon 04/15/2012  . General medical examination 02/21/2012  .  CAD (coronary artery disease) 01/30/2012  . Seasonal allergies 01/24/2012  . Inferior MI-status post stenting 4/13 01/14/2012  . Hyperlipidemia 01/14/2012  . Hypothyroidism 01/14/2012  . SUBSTANCE ABUSE 07/06/2010    Past Surgical History:  Procedure Laterality Date  . ABDOMINAL HYSTERECTOMY     partial  . CARDIAC CATHETERIZATION  2010, 2013   Negative. 2013--100% blockage  . HERNIA REPAIR    . LEFT HEART CATHETERIZATION WITH CORONARY ANGIOGRAM N/A 01/12/2012   Procedure: LEFT HEART CATHETERIZATION WITH CORONARY ANGIOGRAM;  Surgeon: Burnell Blanks, MD;  Location: [facility] CATH LAB;  Service: Cardiovascular;  Laterality: N/A;    OB History    No data available       Home Medications    Prior to Admission medications   Medication Sig Start Date End Date Taking? Authorizing Provider  aspirin EC 81 MG tablet Take 1 tablet (81 mg total) by mouth daily. 06/16/13   Burnell Blanks, MD  loratadine (CLARITIN) 10 MG tablet Take 1 tablet (10 mg total) by mouth daily. Patient not taking: Reported on 07/11/2016 03/27/16   Julianne Rice, MD  mometasone (NASONEX) 50 MCG/ACT nasal spray Place 2 sprays into the nose daily. Patient not taking: Reported on 07/11/2016 03/27/16   Julianne Rice, MD  nitroGLYCERIN (NITROSTAT) 0.4 MG SL tablet Place 1 tablet (0.4 mg total) under the tongue every 5 (five) minutes x 3 doses as needed for chest pain. Patient not taking:  Reported on 07/11/2016 01/18/15   Burnell Blanks, MD    Family History Family History  Problem Relation Age of Onset  . Heart attack Mother 84    prior pacemaker  . Heart disease Mother   . Diabetes Mother   . Heart failure Father     CHF  . Heart disease Father   . Stroke Brother     Social History Social History  Substance Use Topics  . Smoking status: Former Smoker    Years: 33.00    Types: Cigarettes    Quit date: 06/13/2008  . Smokeless tobacco: Never Used     Comment: None since 2009   . Alcohol use No       Comment: former     Allergies   Atorvastatin; Ciprofloxacin; Crestor [rosuvastatin calcium]; and Livalo [pitavastatin]   Review of Systems Review of Systems  Cardiovascular: Positive for chest pain.  All other systems reviewed and are negative.    Physical Exam Updated Vital Signs BP 145/74 (BP Location: Left Arm)   Pulse (!) 58   Temp 98 F (36.7 C) (Oral)   Resp 18   Ht 5\' 6"  (1.676 m)   Wt 194 lb (88 kg)   SpO2 100%   BMI 31.31 kg/m   Physical Exam  Constitutional: She is oriented to person, place, and time. She appears well-developed and well-nourished. No distress.  HENT:  Head: Normocephalic and atraumatic.  Neck: Normal range of motion. Neck supple.  Cardiovascular: Normal rate and regular rhythm.  Exam reveals no gallop and no friction rub.   No murmur heard. Pulmonary/Chest: Effort normal and breath sounds normal. No respiratory distress. She has no wheezes.  Abdominal: Soft. Bowel sounds are normal. She exhibits no distension. There is no tenderness.  Musculoskeletal: Normal range of motion. She exhibits no edema.  There is no calf tenderness or swelling. Homans sign is absent bilaterally.  Neurological: She is alert and oriented to person, place, and time.  Skin: Skin is warm and dry. She is not diaphoretic.  Nursing note and vitals reviewed.    ED Treatments / Results  Labs (all labs ordered are listed, but only abnormal results are displayed) Labs Reviewed  TROPONIN I  CBC WITH DIFFERENTIAL/PLATELET  COMPREHENSIVE METABOLIC PANEL    EKG  EKG Interpretation  Date/Time:  Thursday August 17 2016 14:23:37 EST Ventricular Rate:  55 PR Interval:  178 QRS Duration: 78 QT Interval:  418 QTC Calculation: 399 R Axis:   -26 Text Interpretation:  Sinus bradycardia Otherwise normal ECG Confirmed by Joylyn Duggin  MD, Lizmarie Witters ([id number]) on 08/17/2016 3:20:32 PM       Radiology Dg Chest 2 View  Result Date: 08/17/2016 CLINICAL DATA:  Chest pressure.   Shortness of breath. EXAM: CHEST  2 VIEW COMPARISON:  03/27/2016 .  09/13/2012 . FINDINGS: Mediastinum hilar structures normal. Lungs are clear. Stable cardiomegaly. No pulmonary venous congestion. Stable mild left base pleural thickening noted consistent scarring. IMPRESSION: No acute cardiopulmonary disease. Stable cardiomegaly. Stable left base pleural parenchymal thickening consistent scarring. Electronically Signed   By: Marcello Moores  Register   On: 08/17/2016 14:57    Procedures Procedures (including critical care time)  Medications Ordered in ED Medications - No data to display   Initial Impression / Assessment and Plan / ED Course  I have reviewed the triage vital signs and the nursing notes.  Pertinent labs & imaging results that were available during my care of the patient were reviewed by me and considered in  my medical decision making (see chart for details).  Clinical Course     Patient presents with chest discomfort that started after smelling fumes at work. Her symptoms began at approximately 7 AM and are atypical for cardiac pain. She does have a cardiac history, however her troponin is negative and EKG is unchanged. I've advised the patient that she should stay for a second troponin, however she has informed me she has to pick her grandson up from school and must leave.  She has an appointment in 3 days with her primary Dr. Rene Paci advised her to keep this appointment, and return to the ER in the meantime if her symptoms worsen or change. She understands that an acute coronary syndrome has not been completely ruled out and understands the risks of leaving  Final Clinical Impressions(s) / ED Diagnoses   Final diagnoses:  None    New Prescriptions New Prescriptions   No medications on file     Veryl Speak, MD 08/17/16 1616

## 2016-08-17 NOTE — Discharge Instructions (Signed)
Follow-up with your primary care doctor as scheduled in the next few days, and return to the ER if you develop worsening pain, difficulty breathing, weakness, or other new and concerning symptoms.

## 2016-08-17 NOTE — Telephone Encounter (Signed)
Pt calling regarding yesterday she started feeling a heaviness in chest, felt like she was going to pass out, today walked up the stairs had SOB  pls advise [phone]

## 2016-08-17 NOTE — ED Notes (Signed)
ED Provider at bedside. 

## 2016-08-17 NOTE — Telephone Encounter (Signed)
Pt states she has been "feeling "bad" for 2 days.  She c/o chest tightness/heavy pressure, SOB when going up stairs.  She states she is at work and does not have NTG, nor has she taken it in the past 2 days.  She denies N & V, pain, cough, and fever.  She states she took ASA 81mg  yesterday and today, that did help some.  She c/o not feeling well and feeling like "something is wrong". I advised her to go to call 911/go straight to ED. She states she can not right now and will go after work. I advised her again to call 911 or go straight to the ED right now. She voiced understanding and thanks, states she will go later.

## 2016-08-21 ENCOUNTER — Ambulatory Visit (INDEPENDENT_AMBULATORY_CARE_PROVIDER_SITE_OTHER): Payer: Managed Care, Other (non HMO) | Admitting: Family

## 2016-08-21 ENCOUNTER — Encounter: Payer: Self-pay | Admitting: Family

## 2016-08-21 ENCOUNTER — Encounter: Payer: Self-pay | Admitting: Internal Medicine

## 2016-08-21 VITALS — BP 118/78 | HR 70 | Temp 98.5°F | Resp 16 | Ht 66.0 in | Wt 192.0 lb

## 2016-08-21 DIAGNOSIS — E2839 Other primary ovarian failure: Secondary | ICD-10-CM

## 2016-08-21 DIAGNOSIS — K439 Ventral hernia without obstruction or gangrene: Secondary | ICD-10-CM

## 2016-08-21 DIAGNOSIS — I25119 Atherosclerotic heart disease of native coronary artery with unspecified angina pectoris: Secondary | ICD-10-CM | POA: Diagnosis not present

## 2016-08-21 DIAGNOSIS — Z0000 Encounter for general adult medical examination without abnormal findings: Secondary | ICD-10-CM | POA: Diagnosis not present

## 2016-08-21 LAB — URINALYSIS, ROUTINE W REFLEX MICROSCOPIC
BILIRUBIN URINE: NEGATIVE
HGB URINE DIPSTICK: NEGATIVE
KETONES UR: NEGATIVE
LEUKOCYTES UA: NEGATIVE
NITRITE: NEGATIVE
RBC / HPF: NONE SEEN (ref 0–?)
Specific Gravity, Urine: 1.025 (ref 1.000–1.030)
Total Protein, Urine: NEGATIVE
UROBILINOGEN UA: 0.2 (ref 0.0–1.0)
Urine Glucose: NEGATIVE
pH: 5.5 (ref 5.0–8.0)

## 2016-08-21 NOTE — Progress Notes (Signed)
Subjective:    Patient ID: Kaitlyn Bowman, female    DOB: July 30, 1961, 55 y.o.   MRN: [id number]  HPI  Patient presents today for complete physical.  Immunizations: declines flu shot Diet: fair diet Exercise: walking Colonoscopy: due Dexa: due Pap Smear: hysterectomy Mammogram: due  Declines hep C, declines HIV screening  Patient went to the ED on 11/9 with CP, cardiac enzymes were negative.  Denies current chest pain   Review of Systems  Constitutional: Negative for unexpected weight change.  HENT: Negative for rhinorrhea.   Eyes: Negative for visual disturbance.  Respiratory: Negative for cough.   Cardiovascular: Negative for leg swelling.  Gastrointestinal:       Occasional constipation  Genitourinary: Negative for dysuria and frequency.  Musculoskeletal: Negative for arthralgias and myalgias.  Skin: Negative for rash.  Neurological:       Reports some stress headaches  Hematological: Negative for adenopathy.  Psychiatric/Behavioral:       Denies depression       Past Medical History:  Diagnosis Date  . Abnormal TSH   . Alcohol abuse   . Cocaine abuse    Remote crack cocaine use. UDS negative for cocaine 01/2012  . Coronary artery disease     inferior STEMI 01/13/12 s/p PTCA/DES to mid RCA. Initial EF 45% by cath but improved to 55-60% by echo 01/14/12  . Dyslipidemia    Trig 201, HDL 36, LDL 197 01/2012  . GERD (gastroesophageal reflux disease)   . History of MI (myocardial infarction) 01/2012  . Hypothyroidism      Social History   Social History  . Marital status: Single    Spouse name: N/A  . Number of children: 1  . Years of education: N/A   Occupational History  . 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 Smoking status: Former Smoker    Years: 33.00    Types: Cigarettes    Quit date: 06/13/2008  . Smokeless tobacco: Never Used     Comment: None since 2009   . Alcohol use No     Comment: former  . Drug use: No     Comment: Crack  cocaine; none since Jan 2006   . Sexual activity: Not on file   Other Topics Concern  . 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   Enjoys reading, sewing, Fill in puzzles, walking   Completed GED.   No pets.                   Past Surgical History:  Procedure Laterality Date  . ABDOMINAL HYSTERECTOMY     partial  . CARDIAC CATHETERIZATION  2010, 2013   Negative. 2013--100% blockage  . HERNIA REPAIR    . LEFT HEART CATHETERIZATION WITH CORONARY ANGIOGRAM N/A 01/12/2012   Procedure: LEFT HEART CATHETERIZATION WITH CORONARY ANGIOGRAM;  Surgeon: Burnell Blanks, MD;  Location: [facility] CATH LAB;  Service: Cardiovascular;  Laterality: N/A;    Family History  Problem Relation Age of Onset  . Heart attack Mother 7    prior pacemaker  . Heart disease Mother   . Diabetes Mother   . Heart failure Father     CHF  . Heart disease Father   .  Stroke Brother     Allergies  Allergen Reactions  . Atorvastatin     Myalgia   . Ciprofloxacin     REACTION: nause-sweating-chest tightness  . Crestor [Rosuvastatin Calcium] Nausea Only  . Livalo [Pitavastatin]     myalgia    Current Outpatient Prescriptions on File Prior to Visit  Medication Sig Dispense Refill  . aspirin EC 81 MG tablet Take 1 tablet (81 mg total) by mouth daily. 90 tablet 3  . loratadine (CLARITIN) 10 MG tablet Take 1 tablet (10 mg total) by mouth daily. (Patient taking differently: Take 10 mg by mouth daily as needed. ) 30 tablet 0  . mometasone (NASONEX) 50 MCG/ACT nasal spray Place 2 sprays into the nose daily. (Patient taking differently: Place 2 sprays into the nose daily as needed. ) 17 g 12  . nitroGLYCERIN (NITROSTAT) 0.4 MG SL tablet Place 1 tablet (0.4 mg total) under the tongue every 5 (five) minutes x 3 doses as needed for chest  pain. 25 tablet 6   No current facility-administered medications on file prior to visit.     BP 118/78   Pulse 70   Temp 98.5 F (36.9 C) (Oral)   Resp 16   Ht 5\' 6"  (1.676 m)   Wt 192 lb (87.1 kg)   SpO2 99% Comment: room air  BMI 30.99 kg/m    Objective:   Physical Exam  Physical Exam  Constitutional: She is oriented to person, place, and time. She appears well-developed and well-nourished. No distress.  HENT:  Head: Normocephalic and atraumatic.  Right Ear: Tympanic membrane and ear canal normal.  Left Ear: Tympanic membrane and ear canal normal.  Mouth/Throat: Oropharynx is clear and moist.  Eyes: Pupils are equal, round, and reactive to light. No scleral icterus.  Neck: Normal range of motion. No thyromegaly present.  Cardiovascular: Normal rate and regular rhythm.   No murmur heard. Pulmonary/Chest: Effort normal and breath sounds normal. No respiratory distress. He has no wheezes. She has no rales. She exhibits no tenderness.  Abdominal: Soft. Bowel sounds are normal. She exhibits no distension, ? Small ventral hernia (non-reducible) noted about 2 inches above umbilicus. There is no tenderness. There is no rebound and no guarding.  Musculoskeletal: She exhibits no edema.  Lymphadenopathy:    She has no cervical adenopathy.  Neurological: She is alert and oriented to person, place, and time. She has normal patellar reflexes. She exhibits normal muscle tone. Coordination normal.  Skin: Skin is warm and dry.  Psychiatric: She has a normal mood and affect. Her behavior is normal. Judgment and thought content normal.  Breasts: Examined lying Right: Without masses, retractions, discharge or axillary adenopathy.  Left: Without masses, retractions, discharge or axillary adenopathy.           Assessment & Plan:         Assessment & Plan:  Ventral hernia (recurrent) will refer her back to the surgeon who did her initial repair.  Recurrent Chest pain- usually  occurs in setting of stress. No CP today. She is advised to return to the ED if recurrent chest pain. Will arrange follow up with cardiology. Continue prn nitro.    Preventative care- discussed continuing to work on diet, weight loss. Refer for mammo, dexa, colo, declines flu shot. Obtain routine lab work.  We did discuss seeing a counselor for stress and I have given her information to call for an appointment.

## 2016-08-21 NOTE — Progress Notes (Signed)
Pre visit review using our clinic review tool, if applicable. No additional management support is needed unless otherwise documented below in the visit note. 

## 2016-08-21 NOTE — Patient Instructions (Signed)
Continue to work on diet and weight loss. You will be contacted about scheduling your mammogram, bone density and colonoscopy. Consider contacting a therapist to get established.   You will be contacted about your referral to the surgeon for your hernia.

## 2016-08-22 ENCOUNTER — Other Ambulatory Visit: Payer: Managed Care, Other (non HMO)

## 2016-08-23 NOTE — Addendum Note (Signed)
Addended by: Caffie Pinto on: 08/23/2016 08:57 AM   Modules accepted: Orders

## 2016-10-16 ENCOUNTER — Ambulatory Visit (AMBULATORY_SURGERY_CENTER): Payer: Self-pay

## 2016-10-16 VITALS — Ht 66.0 in | Wt 196.4 lb

## 2016-10-16 DIAGNOSIS — Z1211 Encounter for screening for malignant neoplasm of colon: Secondary | ICD-10-CM

## 2016-10-16 NOTE — Progress Notes (Signed)
Per pt, no allergies to soy or egg products.Pt not taking any weight loss meds or using  O2 at home. 

## 2016-10-18 ENCOUNTER — Encounter: Payer: Self-pay | Admitting: Internal Medicine

## 2016-10-23 ENCOUNTER — Ambulatory Visit (HOSPITAL_BASED_OUTPATIENT_CLINIC_OR_DEPARTMENT_OTHER): Payer: Managed Care, Other (non HMO)

## 2016-10-23 ENCOUNTER — Other Ambulatory Visit (HOSPITAL_BASED_OUTPATIENT_CLINIC_OR_DEPARTMENT_OTHER): Payer: Managed Care, Other (non HMO)

## 2016-10-26 ENCOUNTER — Inpatient Hospital Stay (HOSPITAL_BASED_OUTPATIENT_CLINIC_OR_DEPARTMENT_OTHER): Admission: RE | Admit: 2016-10-26 | Payer: Managed Care, Other (non HMO) | Source: Ambulatory Visit

## 2016-10-26 ENCOUNTER — Other Ambulatory Visit (HOSPITAL_BASED_OUTPATIENT_CLINIC_OR_DEPARTMENT_OTHER): Payer: Managed Care, Other (non HMO)

## 2016-10-27 ENCOUNTER — Telehealth: Payer: Self-pay | Admitting: Internal Medicine

## 2016-10-30 ENCOUNTER — Encounter: Payer: Managed Care, Other (non HMO) | Admitting: Internal Medicine

## 2016-10-30 NOTE — Telephone Encounter (Signed)
No charge. 

## 2016-11-06 ENCOUNTER — Other Ambulatory Visit (HOSPITAL_BASED_OUTPATIENT_CLINIC_OR_DEPARTMENT_OTHER): Payer: Managed Care, Other (non HMO)

## 2016-11-06 ENCOUNTER — Ambulatory Visit (HOSPITAL_BASED_OUTPATIENT_CLINIC_OR_DEPARTMENT_OTHER): Payer: Managed Care, Other (non HMO)

## 2016-11-28 LAB — HM MAMMOGRAPHY

## 2016-11-30 ENCOUNTER — Encounter: Payer: Self-pay | Admitting: Cardiovascular Disease

## 2016-12-11 ENCOUNTER — Ambulatory Visit: Payer: Managed Care, Other (non HMO) | Admitting: Cardiovascular Disease

## 2017-01-24 IMAGING — CR DG CHEST 2V
2 series · 2 of 2 positions shown · non-contrast
Comparison: 03/27/2016 .  09/13/2012 .

CLINICAL DATA: Chest pressure.  Shortness of breath.

EXAM:
CHEST  2 VIEW

[w chest pa]
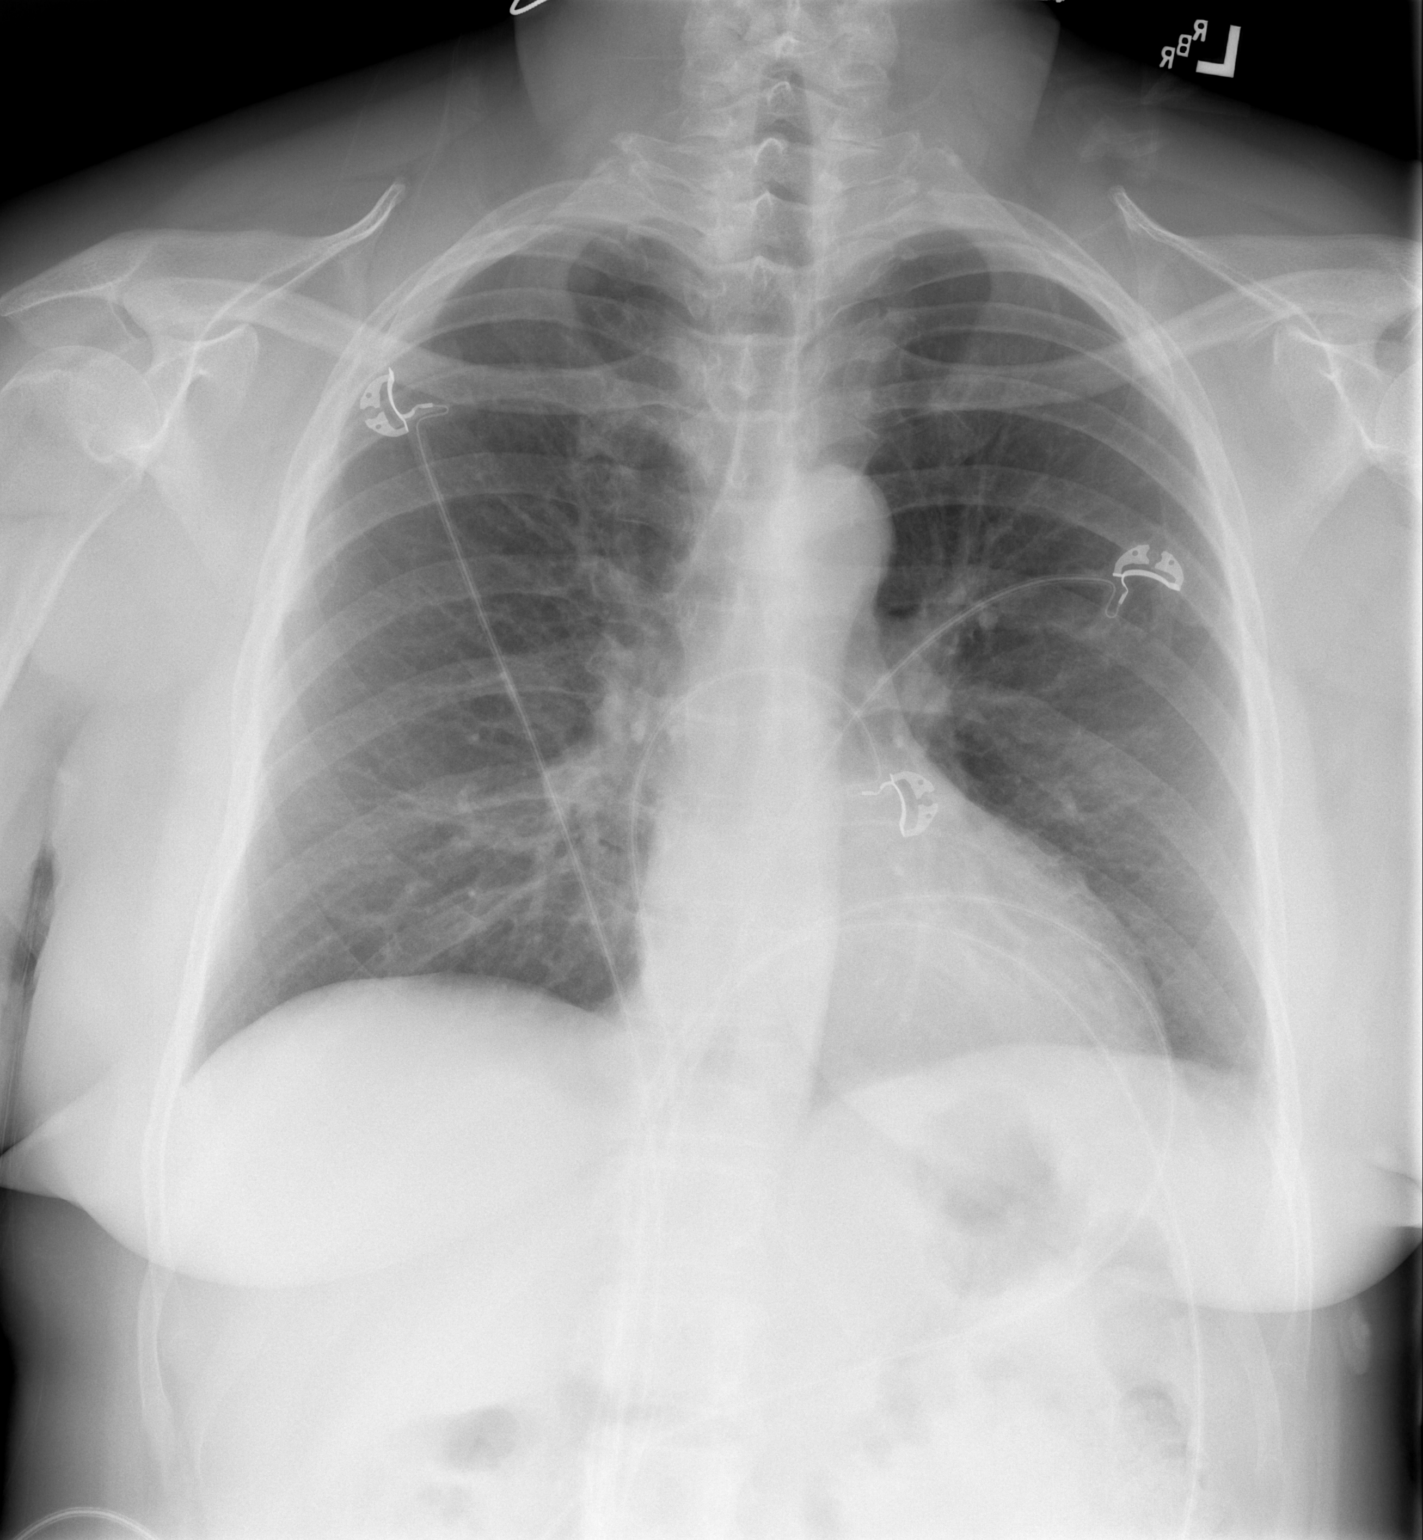

[w chest lat]
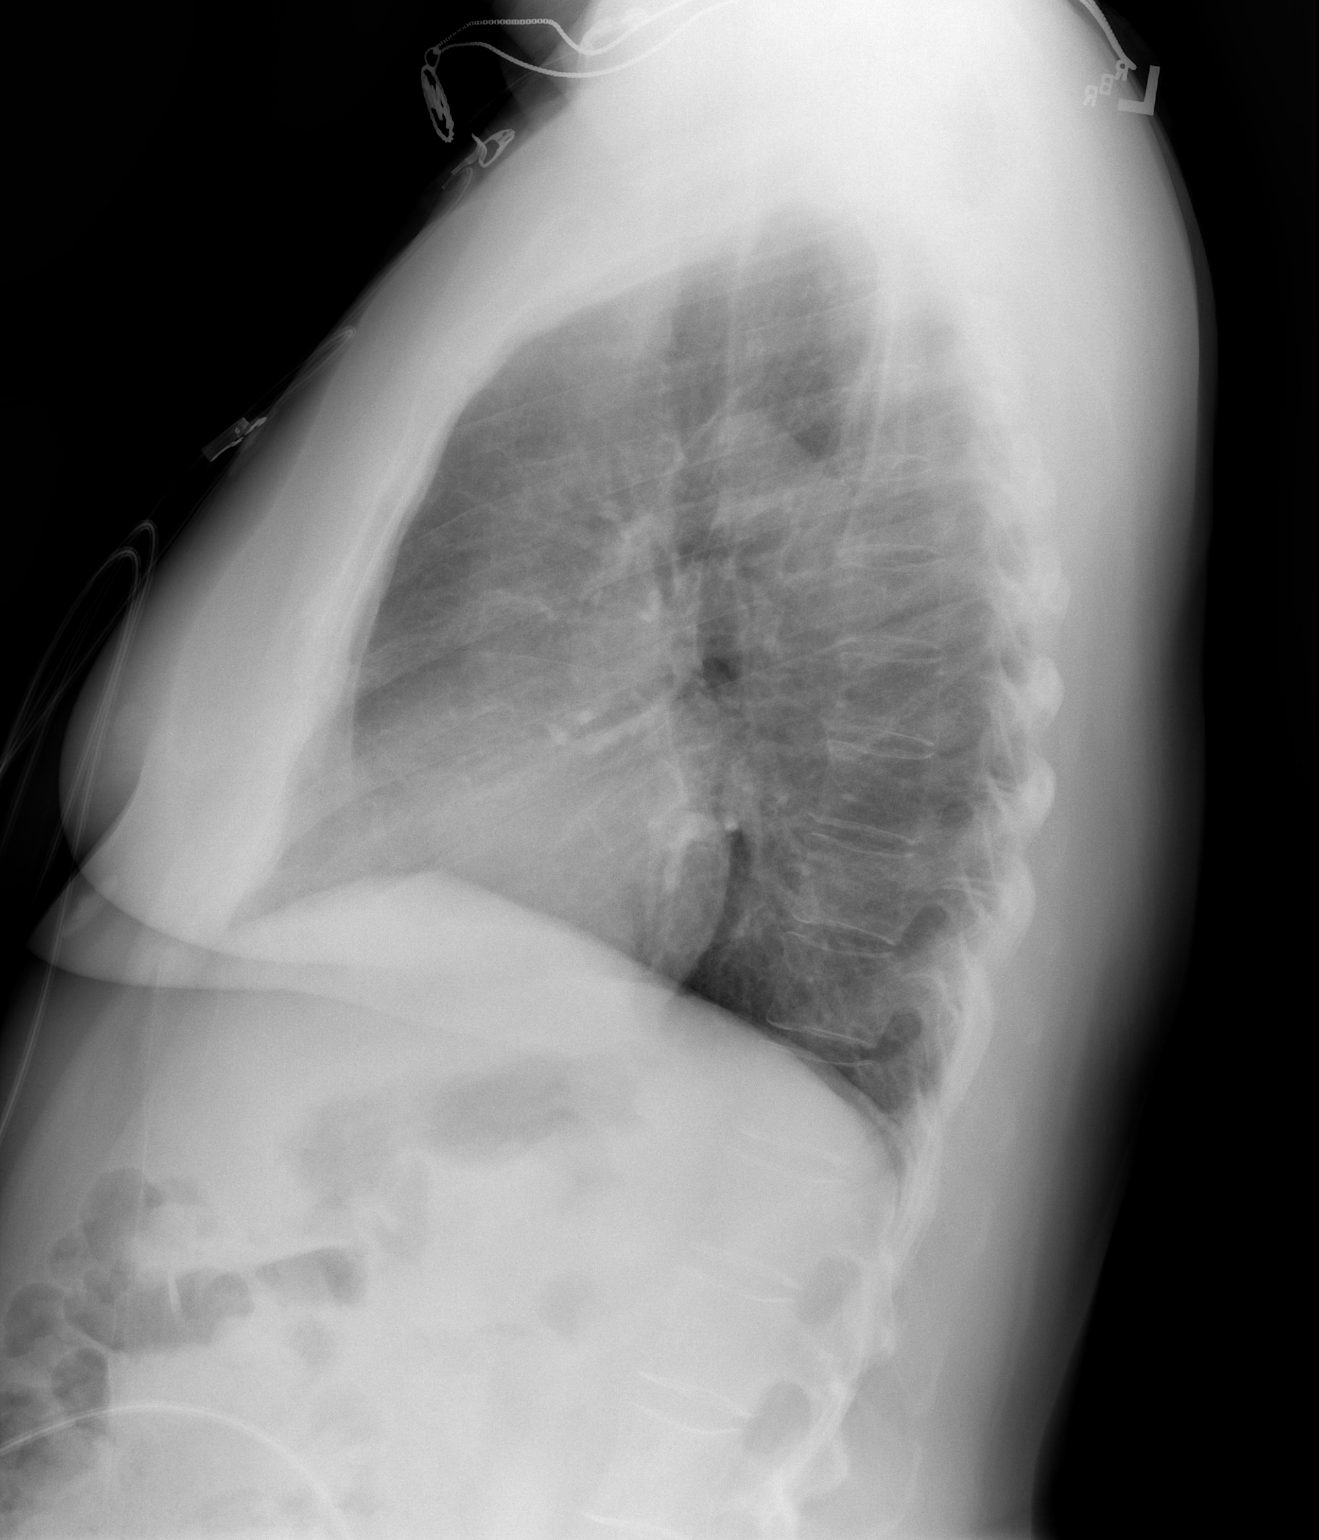

[2 of 2 positions shown; findings below may reference images not displayed]

FINDINGS: Mediastinum hilar structures normal. Lungs are clear. Stable
cardiomegaly. No pulmonary venous congestion. Stable mild left base
pleural thickening noted consistent scarring.
IMPRESSION: No acute cardiopulmonary disease. Stable cardiomegaly. Stable left
base pleural parenchymal thickening consistent scarring.

## 2017-01-25 ENCOUNTER — Encounter: Payer: Self-pay | Admitting: Internal Medicine

## 2017-01-25 ENCOUNTER — Ambulatory Visit (INDEPENDENT_AMBULATORY_CARE_PROVIDER_SITE_OTHER): Payer: 59 | Admitting: Internal Medicine

## 2017-01-25 ENCOUNTER — Ambulatory Visit (HOSPITAL_BASED_OUTPATIENT_CLINIC_OR_DEPARTMENT_OTHER)
Admission: RE | Admit: 2017-01-25 | Discharge: 2017-01-25 | Disposition: A | Discharge status: Home / Self Care | Payer: 59 | Source: Ambulatory Visit | Attending: Internal Medicine | Admitting: Internal Medicine

## 2017-01-25 VITALS — BP 124/80 | HR 70 | Temp 97.8°F | Resp 14 | Ht 66.0 in | Wt 202.4 lb

## 2017-01-25 DIAGNOSIS — M79661 Pain in right lower leg: Secondary | ICD-10-CM

## 2017-01-25 NOTE — Progress Notes (Signed)
Pre visit review using our clinic review tool, if applicable. No additional management support is needed unless otherwise documented below in the visit note. 

## 2017-01-25 NOTE — Progress Notes (Signed)
Subjective:    Patient ID: Kaitlyn Bowman, female    DOB: 1960-11-21, 56 y.o.   MRN: [id number]  DOS:  01/25/2017 Type of visit - description : acute Interval history: 2 weeks history of right calf pain, "feels like a pulled muscle". The pain is started shortly after her knee almost gave out on her and she near fell but no other injury to that area.   Review of Systems Denies rash, redness or warmness. She is very active at work on walks all day long. Denies chest pain, difficulty breathing. No claudication She started to use a new shoe insert and wonders if that has triggered some of the pain she is feeling.  Past Medical History:  Diagnosis Date  . Abnormal TSH   . Alcohol abuse   . Cocaine abuse    Remote crack cocaine use. UDS negative for cocaine 01/2012  . Coronary artery disease     inferior STEMI 01/13/12 s/p PTCA/DES to mid RCA. Initial EF 45% by cath but improved to 55-60% by echo 01/14/12  . Dyslipidemia    Trig 201, HDL 36, LDL 197 01/2012  . GERD (gastroesophageal reflux disease)   . History of MI (myocardial infarction) 01/2012  . Hypothyroidism   . Myocardial infarction ([place]) 2012  . Umbilical hernia     Past Surgical History:  Procedure Laterality Date  . ABDOMINAL HYSTERECTOMY  1981   partial  . CARDIAC CATHETERIZATION  2010, 2013   Negative. 2013--100% blockage  . HERNIA REPAIR  2017   abd hernia   . LEFT HEART CATHETERIZATION WITH CORONARY ANGIOGRAM N/A 01/12/2012   Procedure: LEFT HEART CATHETERIZATION WITH CORONARY ANGIOGRAM;  Surgeon: Burnell Blanks, MD;  Location: [facility] CATH LAB;  Service: Cardiovascular;  Laterality: N/A;    Social History   Social History  . Marital status: Single    Spouse name: N/A  . Number of children: 1  . Years of education: N/A   Occupational History  . 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 Smoking status: Former Smoker    Years: 33.00    Types: Cigarettes    Quit date: 06/13/2008  . Smokeless tobacco:  Never Used     Comment: None since 2009   . Alcohol use No     Comment: former  . Drug use: No     Comment: Crack cocaine; none since Jan 2006   . Sexual activity: Not on file   Other Topics Concern  . 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   Enjoys reading, sewing, Fill in puzzles, walking   Completed GED.   No pets.                     Allergies as of 01/25/2017      Reactions   Ciprofloxacin    REACTION: nause-sweating-chest tightness   Atorvastatin    Myalgia   Crestor [rosuvastatin Calcium] Nausea Only   Livalo [pitavastatin]    myalgia      Medication List       Accurate as of 01/25/17 11:59 PM. Always use your most recent med list.          aspirin EC 81 MG tablet Take 1  tablet (81 mg total) by mouth daily.   bisacodyl 5 MG EC tablet Commonly known as:  DULCOLAX Take 5 mg by mouth. Dulcolax 5 mg bowel prep #4-Take as directed   loratadine 10 MG tablet Commonly known as:  CLARITIN Take 1 tablet (10 mg total) by mouth daily.   MIRALAX powder Generic drug:  polyethylene glycol powder Take 1 Container by mouth. Miralax 238 gm bowel prep-Take as directed   mometasone 50 MCG/ACT nasal spray Commonly known as:  NASONEX Place 2 sprays into the nose daily.   nitroGLYCERIN 0.4 MG SL tablet Commonly known as:  NITROSTAT Place 1 tablet (0.4 mg total) under the tongue every 5 (five) minutes x 3 doses as needed for chest pain.          Objective:   Physical Exam BP 124/80 (BP Location: Left Arm, Patient Position: Sitting, Cuff Size: Normal)   Pulse 70   Temp 97.8 F (36.6 C) (Oral)   Resp 14   Ht 5\' 6"  (1.676 m)   Wt 202 lb 6 oz (91.8 kg)   SpO2 97%   BMI 32.66 kg/m  General:   Well developed, well nourished . NAD.  HEENT:  Normocephalic . Face symmetric,  atraumatic Lower extremities: No pitting edema. Good pedal pulses bilaterally, all toes are warm and pink. Right calf: Slightly TTP, circumference is 3/4 of an inch larger compared to the left. Left calf: No TTP. Distal from the calf, the legs are symmetric in circumference. Skin: Lower extremity skin normal Neurologic:  alert & oriented X3.  Speech normal, gait appropriate for age and unassisted Psych--  Cognition and judgment appear intact.  Cooperative with normal attention span and concentration.  Behavior appropriate. No anxious or depressed appearing.      Assessment & Plan:   56 year old lady with a remote  history of substance abuse, menopausal, hypothyroidism, hyperlipidemia, CAD:  Presents with pain at the right calf, no history of DVT, not tachycardic, no shortness of breath. Suspect MSK issue (see HPI) however a DVT muss be rule out. Plan: Ultrasound of the lower extremity to rule out DVT. Tylenol as needed, reports is intolerant to ibuprofen. Eyes If ultrasound negative, will refer to sports medicine.  Addendum: Ultrasound negative, refer to sports medicine

## 2017-01-25 NOTE — Patient Instructions (Addendum)
Please go downstairs and get a ultrasound of the calf.  Rest, ice at night  IBUPROFEN (Advil or Motrin) 200 mg 2 tablets every 6 hours as needed for pain.  Always take it with food because may cause gastritis and ulcers.  If you notice nausea, stomach pain, change in the color of stools --->  Stop the medicine and let us know   Tylenol  500 mg OTC 2 tabs a day every 8 hours as needed for pain   Call anytime if you have severe pain, redness, swelling. Call if not improving in the next few days Also if  chest pain, difficulty breathing or palpitations.

## 2017-01-26 ENCOUNTER — Telehealth: Payer: Self-pay | Admitting: Family

## 2017-01-26 NOTE — Telephone Encounter (Signed)
Relation to KI:CHTV Call back number: Pharmacy:  Reason for call:  Patient returning call regarding imaging results,please advise

## 2017-01-26 NOTE — Telephone Encounter (Signed)
Notes recorded by Damita Dunnings, CMA on 01/26/2017 at 2:37 PM EDT Spoke w/ Pt, informed of results and recommendations. Pt verbalized understanding. ------  Notes recorded by Damita Dunnings, CMA on 01/26/2017 at 10:46 AM EDT [facility] informing Pt to return call regarding results. Sports medicine referral placed.

## 2017-01-29 LAB — HM COLONOSCOPY

## 2017-02-12 ENCOUNTER — Ambulatory Visit: Payer: 59 | Admitting: Family Medicine

## 2017-02-19 ENCOUNTER — Encounter: Payer: Self-pay | Admitting: Cardiovascular Disease

## 2017-02-19 ENCOUNTER — Ambulatory Visit (INDEPENDENT_AMBULATORY_CARE_PROVIDER_SITE_OTHER): Payer: 59 | Admitting: Family

## 2017-02-19 ENCOUNTER — Encounter: Payer: Self-pay | Admitting: Family

## 2017-02-19 ENCOUNTER — Ambulatory Visit (INDEPENDENT_AMBULATORY_CARE_PROVIDER_SITE_OTHER): Payer: 59 | Admitting: Cardiovascular Disease

## 2017-02-19 VITALS — BP 124/60 | HR 64 | Ht 66.0 in | Wt 203.6 lb

## 2017-02-19 VITALS — BP 131/65 | HR 55 | Temp 98.4°F | Resp 16 | Ht 66.0 in | Wt 203.0 lb

## 2017-02-19 DIAGNOSIS — I2510 Atherosclerotic heart disease of native coronary artery without angina pectoris: Secondary | ICD-10-CM

## 2017-02-19 DIAGNOSIS — I2584 Coronary atherosclerosis due to calcified coronary lesion: Secondary | ICD-10-CM

## 2017-02-19 DIAGNOSIS — E7800 Pure hypercholesterolemia, unspecified: Secondary | ICD-10-CM

## 2017-02-19 DIAGNOSIS — J301 Allergic rhinitis due to pollen: Secondary | ICD-10-CM

## 2017-02-19 DIAGNOSIS — Z0000 Encounter for general adult medical examination without abnormal findings: Secondary | ICD-10-CM | POA: Diagnosis not present

## 2017-02-19 LAB — LDL CHOLESTEROL, DIRECT: Direct LDL: 167 mg/dL

## 2017-02-19 LAB — CBC WITH DIFFERENTIAL/PLATELET
Basophils Absolute: 0.1 10*3/uL (ref 0.0–0.1)
Basophils Relative: 0.9 % (ref 0.0–3.0)
EOS PCT: 0.9 % (ref 0.0–5.0)
Eosinophils Absolute: 0.1 10*3/uL (ref 0.0–0.7)
HCT: 39.4 % (ref 36.0–46.0)
HEMOGLOBIN: 12.6 g/dL (ref 12.0–15.0)
Lymphocytes Relative: 52.2 % — ABNORMAL HIGH (ref 12.0–46.0)
Lymphs Abs: 4 10*3/uL (ref 0.7–4.0)
MCHC: 32.1 g/dL (ref 30.0–36.0)
MCV: 80.5 fl (ref 78.0–100.0)
MONO ABS: 0.5 10*3/uL (ref 0.1–1.0)
Monocytes Relative: 6.4 % (ref 3.0–12.0)
NEUTROS PCT: 39.6 % — AB (ref 43.0–77.0)
Neutro Abs: 3 10*3/uL (ref 1.4–7.7)
PLATELETS: 328 10*3/uL (ref 150.0–400.0)
RBC: 4.9 Mil/uL (ref 3.87–5.11)
RDW: 15.7 % — AB (ref 11.5–15.5)
WBC: 7.6 10*3/uL (ref 4.0–10.5)

## 2017-02-19 LAB — BASIC METABOLIC PANEL
BUN: 14 mg/dL (ref 6–23)
CHLORIDE: 105 meq/L (ref 96–112)
CO2: 26 meq/L (ref 19–32)
Calcium: 10 mg/dL (ref 8.4–10.5)
Creatinine, Ser: 0.78 mg/dL (ref 0.40–1.20)
GFR: 98.07 mL/min (ref >60.00)
Glucose, Bld: 70 mg/dL (ref 70–99)
Potassium: 4 mEq/L (ref 3.5–5.1)
SODIUM: 139 meq/L (ref 135–145)

## 2017-02-19 LAB — HEPATIC FUNCTION PANEL
ALK PHOS: 74 U/L (ref 39–117)
ALT: 18 U/L (ref 0–35)
AST: 18 U/L (ref 0–37)
Albumin: 4.3 g/dL (ref 3.5–5.2)
BILIRUBIN DIRECT: 0 mg/dL (ref 0.0–0.3)
TOTAL PROTEIN: 7.9 g/dL (ref 6.0–8.3)
Total Bilirubin: 0.3 mg/dL (ref 0.2–1.2)

## 2017-02-19 LAB — TSH: TSH: 8.11 u[IU]/mL — ABNORMAL HIGH (ref 0.35–4.50)

## 2017-02-19 LAB — LIPID PANEL
CHOLESTEROL: 277 mg/dL — AB (ref 0–200)
HDL: 37.2 mg/dL — AB (ref >39.00)
NonHDL: 240.25
TRIGLYCERIDES: 333 mg/dL — AB (ref 0.0–149.0)
Total CHOL/HDL Ratio: 7
VLDL: 66.6 mg/dL — AB (ref 0.0–40.0)

## 2017-02-19 NOTE — Patient Instructions (Signed)
Please complete lab work prior to leaving.   

## 2017-02-19 NOTE — Patient Instructions (Signed)

## 2017-02-19 NOTE — Assessment & Plan Note (Signed)
Continue ASA, management per cardiology.

## 2017-02-19 NOTE — Assessment & Plan Note (Addendum)
Discussed importance of treating her high cholesterol to decrease risk for future MI or stroke.  Pt verbalizes understanding and states that she will think about it. Repeat lipid panel.

## 2017-02-19 NOTE — Progress Notes (Signed)
Subjective:    Patient ID: Kaitlyn Bowman, female    DOB: 02-29-1961, 56 y.o.   MRN: [id number]  HPI  Kaitlyn Bowman is a 56 yr old female who presents today for follow up.   Hyperlipidemia-Patient decided not to proceed with Repatha.  She is intolerant to statins.  Lab Results  Component Value Date   CHOL 279 (H) 01/10/2016   HDL 37 (L) 01/10/2016   LDLCALC 210 (H) 01/10/2016   LDLDIRECT 172.0 08/09/2015   TRIG 159 (H) 01/10/2016   CHOLHDL 7.5 (H) 01/10/2016   CAD- continues aspirin prn. Following with Dr. Angelena Form.  Denies chest pain.   Seasonal allergies- stable on claritin prn.     Review of Systems See HPI  Past Medical History:  Diagnosis Date  . Abnormal TSH   . Alcohol abuse   . Cocaine abuse    Remote crack cocaine use. UDS negative for cocaine 01/2012  . Coronary artery disease     inferior STEMI 01/13/12 s/p PTCA/DES to mid RCA. Initial EF 45% by cath but improved to 55-60% by echo 01/14/12  . Dyslipidemia    Trig 201, HDL 36, LDL 197 01/2012  . GERD (gastroesophageal reflux disease)   . History of MI (myocardial infarction) 01/2012  . Hypothyroidism   . Myocardial infarction ([place]) 2012  . Umbilical hernia      Social History   Social History  . Marital status: Single    Spouse name: Kaitlyn Bowman  . Number of children: 1  . Years of education: Kaitlyn Bowman   Occupational History  . 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 Smoking status: Former Smoker    Years: 33.00    Types: Cigarettes    Quit date: 06/13/2008  . Smokeless tobacco: Never Used     Comment: None since 2009   . Alcohol use No     Comment: former  . Drug use: No     Comment: Crack cocaine; none since Jan 2006   . Sexual activity: Not on file   Other Topics Concern  . 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   Enjoys  reading, sewing, Fill in puzzles, walking   Completed GED.   No pets.                   Past Surgical History:  Procedure Laterality Date  . ABDOMINAL HYSTERECTOMY  1981   partial  . CARDIAC CATHETERIZATION  2010, 2013   Negative. 2013--100% blockage  . HERNIA REPAIR  2017   abd hernia   . LEFT HEART CATHETERIZATION WITH CORONARY ANGIOGRAM Kaitlyn Bowman 01/12/2012   Procedure: LEFT HEART CATHETERIZATION WITH CORONARY ANGIOGRAM;  Surgeon: Burnell Blanks, MD;  Location: [facility] CATH LAB;  Service: Cardiovascular;  Laterality: Kaitlyn Bowman;    Family History  Problem Relation Age of Onset  . Heart attack Mother 69       prior pacemaker  . Heart disease Mother   . Diabetes Mother   . Heart failure Father        CHF  . Heart disease Father   . Alcoholism Father   . Stroke Brother     Allergies  Allergen Reactions  . Ciprofloxacin     REACTION:  nause-sweating-chest tightness  . Atorvastatin     Myalgia   . Crestor [Rosuvastatin Calcium] Nausea Only  . Livalo [Pitavastatin]     myalgia    Current Outpatient Prescriptions on File Prior to Visit  Medication Sig Dispense Refill  . aspirin EC 81 MG tablet Take 1 tablet (81 mg total) by mouth daily. 90 tablet 3  . loratadine (CLARITIN) 10 MG tablet Take 1 tablet (10 mg total) by mouth daily. (Patient taking differently: Take 10 mg by mouth daily as needed. ) 30 tablet 0  . mometasone (NASONEX) 50 MCG/ACT nasal spray Place 2 sprays into the nose daily. (Patient taking differently: Place 2 sprays into the nose daily as needed. ) 17 g 12  . nitroGLYCERIN (NITROSTAT) 0.4 MG SL tablet Place 1 tablet (0.4 mg total) under the tongue every 5 (five) minutes x 3 doses as needed for chest pain. 25 tablet 6   No current facility-administered medications on file prior to visit.     BP 131/65 (BP Location: Right Arm, Cuff Size: Large)   Pulse (!) 55   Temp 98.4 F (36.9 C) (Oral)   Resp 16   Ht 5\' 6"  (1.676 m)   Wt 203 lb (92.1 kg)   SpO2 99%    BMI 32.77 kg/m       Objective:   Physical Exam  Constitutional: She is oriented to person, place, and time. She appears well-developed and well-nourished.  HENT:  Head: Normocephalic and atraumatic.  Cardiovascular: Normal rate, regular rhythm and normal heart sounds.   No murmur heard. Pulmonary/Chest: Effort normal and breath sounds normal. No respiratory distress. She has no wheezes.  Musculoskeletal: She exhibits no edema.  Neurological: She is alert and oriented to person, place, and time.  Psychiatric: She has a normal mood and affect. Her behavior is normal. Judgment and thought content normal.          Assessment & Plan:

## 2017-02-19 NOTE — Assessment & Plan Note (Signed)
Stable with PRN use of claritin.

## 2017-02-19 NOTE — Progress Notes (Signed)
Chief Complaint  Patient presents with  . Follow-up    History of Present Illness: 56 yo female with history of CAD, HLD here today for cardiac follow up. She had an inferior STEMI in April 2013 and was found to have an occluded mid RCA which was treated with a drug eluting stent. Most recent echo May 2016 with normal LV function. Stress test in 2016 with no ischemia.    She is here today for follow up. The patient denies any chest pain, dyspnea, palpitations, lower extremity edema, orthopnea, PND, dizziness, near syncope or syncope.   Primary Care Provider: [name], NP   Past Medical History:  Diagnosis Date  . Abnormal TSH   . Alcohol abuse   . Cocaine abuse    Remote crack cocaine use. UDS negative for cocaine 01/2012  . Coronary artery disease     inferior STEMI 01/13/12 s/p PTCA/DES to mid RCA. Initial EF 45% by cath but improved to 55-60% by echo 01/14/12  . Dyslipidemia    Trig 201, HDL 36, LDL 197 01/2012  . GERD (gastroesophageal reflux disease)   . History of MI (myocardial infarction) 01/2012  . Hypothyroidism   . Myocardial infarction ([place]) 2012  . Umbilical hernia     Past Surgical History:  Procedure Laterality Date  . ABDOMINAL HYSTERECTOMY  1981   partial  . CARDIAC CATHETERIZATION  2010, 2013   Negative. 2013--100% blockage  . HERNIA REPAIR  2017   abd hernia   . LEFT HEART CATHETERIZATION WITH CORONARY ANGIOGRAM N/A 01/12/2012   Procedure: LEFT HEART CATHETERIZATION WITH CORONARY ANGIOGRAM;  Surgeon: Burnell Blanks, MD;  Location: [facility] CATH LAB;  Service: Cardiovascular;  Laterality: N/A;    Current Outpatient Prescriptions  Medication Sig Dispense Refill  . aspirin EC 81 MG tablet Take 1 tablet (81 mg total) by mouth daily. 90 tablet 3  . loratadine (CLARITIN) 10 MG tablet Take 1 tablet (10 mg total) by mouth daily. (Patient taking differently: Take 10 mg by mouth daily as needed. ) 30 tablet 0  . mometasone (NASONEX) 50 MCG/ACT nasal spray  Place 2 sprays into the nose daily. (Patient taking differently: Place 2 sprays into the nose daily as needed. ) 17 g 12  . nitroGLYCERIN (NITROSTAT) 0.4 MG SL tablet Place 1 tablet (0.4 mg total) under the tongue every 5 (five) minutes x 3 doses as needed for chest pain. 25 tablet 6   No current facility-administered medications for this visit.     Allergies  Allergen Reactions  . Ciprofloxacin     REACTION: nause-sweating-chest tightness  . Atorvastatin     Myalgia   . Crestor [Rosuvastatin Calcium] Nausea Only  . Livalo [Pitavastatin]     myalgia    Social History   Social History  . Marital status: Single    Spouse name: N/A  . Number of children: 1  . Years of education: N/A   Occupational History  . 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 Smoking status: Former Smoker    Years: 33.00    Types: Cigarettes    Quit date: 06/13/2008  . Smokeless tobacco: Never Used     Comment: None since 2009   . Alcohol use No     Comment: former  . Drug use: No     Comment: Crack cocaine; none since Jan 2006   . Sexual activity: Not on file   Other Topics Concern  . 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   Enjoys reading, sewing, Fill in puzzles, walking   Completed GED.   No pets.                   Family History  Problem Relation Age of Onset  . Heart attack Mother 34       prior pacemaker  . Heart disease Mother   . Diabetes Mother   . Heart failure Father        CHF  . Heart disease Father   . Alcoholism Father   . Stroke Brother     Review of Systems:  As stated in the HPI and otherwise negative.   BP 124/60   Pulse 64   Ht 5\' 6"  (1.676 m)   Wt 203 lb 9.6 oz (92.4 kg)   SpO2 96%   BMI 32.86 kg/m   Physical Examination:  General: Well developed, well nourished,  NAD  HEENT: OP clear, mucus membranes moist  SKIN: warm, dry. No rashes. Neuro: No focal deficits  Musculoskeletal: Muscle strength 5/5 all ext  Psychiatric: Mood and affect normal  Neck: No JVD, no carotid bruits, no thyromegaly, no lymphadenopathy.  Lungs:Clear bilaterally, no wheezes, rhonci, crackles Cardiovascular: Regular rate and rhythm. No murmurs, gallops or rubs. Abdomen:Soft. Bowel sounds present. Non-tender.  Extremities: No lower extremity edema. Pulses are 2 + in the bilateral DP/PT.  Echo 02/15/15: - Left ventricle: The cavity size was normal. Systolic function was normal. The estimated ejection fraction was in the range of 55% to 60%. Wall motion was normal; there were no regional wall motion abnormalities. Left ventricular diastolic function parameters were normal. - Aortic valve: There was trivial regurgitation. - Mitral valve: There was mild to moderate regurgitation directed centrally. - Left atrium: The atrium was mildly dilated.   EKG:  EKG is not  ordered today. The ekg ordered today demonstrates   Recent Labs: 08/17/2016: ALT 20; BUN 11; Creatinine, Ser 0.85; Hemoglobin 11.9; Platelets 334; Potassium 4.4; Sodium 140   Lipid Panel    Component Value Date/Time   CHOL 279 (H) 01/10/2016 1504   TRIG 159 (H) 01/10/2016 1504   HDL 37 (L) 01/10/2016 1504   CHOLHDL 7.5 (H) 01/10/2016 1504   VLDL 32 (H) 01/10/2016 1504   LDLCALC 210 (H) 01/10/2016 1504   LDLDIRECT 172.0 08/09/2015 1031     Wt Readings from Last 3 Encounters:  02/19/17 203 lb 9.6 oz (92.4 kg)  01/25/17 202 lb 6 oz (91.8 kg)  10/16/16 196 lb 6.4 oz (89.1 kg)     Other studies Reviewed: Additional studies/ records that were reviewed today include:  Review of the above records demonstrates:   Cardiac cath 01/12/12: Left main: No obstructive disease.  Left Anterior Descending Artery: Large vessel that courses to the apex. Mild luminal irregularities in the mid vessel. Moderate  sized diagonal branch with no disease.  Circumflex Artery: Large caliber vessel with serial 20% lesions in the mid vessel. OM1 is moderate sized with no disease. The second and third marginals are small. The fourth OM branch is moderate sized and has a 30% proximal stenosis.  Right Coronary Artery: Dominant vessel with 100% mid occlusion.  Left Ventricular Angiogram: LVEF 45%. Inferior wall hypokinesis.    Assessment  and Plan:   1. CAD without angina: No chest pain suggestive of angina. She does not tolerate statins due to muscle aches or beta blockers due to fatigue. Exercise stress test May 2016 with no ischemia. Normal LV function by echo 2016. Continue ASA.    2. HLD: She has not tolerated statins. She was seen in lipid clinic and approved for [place] in 2017 but she refuses to start this. We have had a long discussion today regarding this. She will think about it. She is planning lab work in primary care today.   Current medicines are reviewed at length with the patient today.  The patient does not have concerns regarding medicines.  The following changes have been made:  no change  Labs/ tests ordered today include:  No orders of the defined types were placed in this encounter.   Disposition:   FU with me in 12 months  Signed, Lauree Chandler, MD 02/19/2017 1:51 PM    [facility] [address], [place], [place]  [address] Phone: [phone]; Fax: [phone]

## 2017-02-20 ENCOUNTER — Ambulatory Visit (INDEPENDENT_AMBULATORY_CARE_PROVIDER_SITE_OTHER): Payer: 59 | Admitting: Family Medicine

## 2017-02-20 ENCOUNTER — Telehealth: Payer: Self-pay | Admitting: Family

## 2017-02-20 ENCOUNTER — Encounter: Payer: Self-pay | Admitting: Family Medicine

## 2017-02-20 DIAGNOSIS — M79604 Pain in right leg: Secondary | ICD-10-CM

## 2017-02-20 DIAGNOSIS — E039 Hypothyroidism, unspecified: Secondary | ICD-10-CM

## 2017-02-20 LAB — URINALYSIS, ROUTINE W REFLEX MICROSCOPIC
Bilirubin Urine: NEGATIVE
Hgb urine dipstick: NEGATIVE
KETONES UR: NEGATIVE
LEUKOCYTES UA: NEGATIVE
Nitrite: NEGATIVE
PH: 6.5 (ref 5.0–8.0)
RBC / HPF: NONE SEEN (ref 0–?)
SPECIFIC GRAVITY, URINE: 1.02 (ref 1.000–1.030)
TOTAL PROTEIN, URINE-UPE24: NEGATIVE
Urine Glucose: NEGATIVE
Urobilinogen, UA: 0.2 (ref 0.0–1.0)

## 2017-02-20 NOTE — Patient Instructions (Signed)
You have an overuse strain of your anterior tibialis muscle. Compression sleeve when up and walking around. Heat 15 minutes at a time 3-4 times a day. Tylenol 500mg  1-2 tabs three times a day as needed for pain. Do home exercises with theraband 3 sets of 10 once a day. Sports insoles when up and walking around in your shoes.  (other options include spencos, dr. Zoe Lan active series, superfeet). Follow up with me in 1 month for reevaluation.

## 2017-02-21 ENCOUNTER — Other Ambulatory Visit (INDEPENDENT_AMBULATORY_CARE_PROVIDER_SITE_OTHER): Payer: 59

## 2017-02-21 DIAGNOSIS — E039 Hypothyroidism, unspecified: Secondary | ICD-10-CM

## 2017-02-21 DIAGNOSIS — I519 Heart disease, unspecified: Secondary | ICD-10-CM

## 2017-02-21 LAB — T4, FREE: Free T4: 0.76 ng/dL (ref 0.60–1.60)

## 2017-02-21 LAB — T3, FREE: T3, Free: 3.3 pg/mL (ref 2.3–4.2)

## 2017-02-22 DIAGNOSIS — M25561 Pain in right knee: Secondary | ICD-10-CM | POA: Insufficient documentation

## 2017-02-22 DIAGNOSIS — M179 Osteoarthritis of knee, unspecified: Secondary | ICD-10-CM | POA: Insufficient documentation

## 2017-02-22 NOTE — Assessment & Plan Note (Signed)
consistent with overuse strain of anterior tibialis.  Compression sleeve, heat, tylenol for pain.  Shown home exercises to do daily.  Sports insoles to help with arch support as well.  F/u in 1 month.

## 2017-02-22 NOTE — Progress Notes (Signed)
PCP and consultation requested by: [name], NP  Subjective:   HPI: Patient is a 56 y.o. female here for right leg, knee pain.  Patient reports for about 1 month she has had about 1 month of pain in anterolateral right lower leg. No acute injury or trauma. She is on her feet a lot at work. More pain lying down after working. Pain is 4/10, can be sharp. Tried aspirin, tylenol. No skin changes, numbness.  Past Medical History:  Diagnosis Date  . Abnormal TSH   . Alcohol abuse   . Cocaine abuse    Remote crack cocaine use. UDS negative for cocaine 01/2012  . Coronary artery disease     inferior STEMI 01/13/12 s/p PTCA/DES to mid RCA. Initial EF 45% by cath but improved to 55-60% by echo 01/14/12  . Dyslipidemia    Trig 201, HDL 36, LDL 197 01/2012  . GERD (gastroesophageal reflux disease)   . History of MI (myocardial infarction) 01/2012  . Hypothyroidism   . Myocardial infarction ([place]) 2012  . Umbilical hernia     Current Outpatient Prescriptions on File Prior to Visit  Medication Sig Dispense Refill  . aspirin EC 81 MG tablet Take 1 tablet (81 mg total) by mouth daily. 90 tablet 3  . loratadine (CLARITIN) 10 MG tablet Take 1 tablet (10 mg total) by mouth daily. (Patient taking differently: Take 10 mg by mouth daily as needed. ) 30 tablet 0  . mometasone (NASONEX) 50 MCG/ACT nasal spray Place 2 sprays into the nose daily. (Patient taking differently: Place 2 sprays into the nose daily as needed. ) 17 g 12  . nitroGLYCERIN (NITROSTAT) 0.4 MG SL tablet Place 1 tablet (0.4 mg total) under the tongue every 5 (five) minutes x 3 doses as needed for chest pain. 25 tablet 6   No current facility-administered medications on file prior to visit.     Past Surgical History:  Procedure Laterality Date  . ABDOMINAL HYSTERECTOMY  1981   partial  . CARDIAC CATHETERIZATION  2010, 2013   Negative. 2013--100% blockage  . HERNIA REPAIR  2017   abd hernia   . LEFT HEART CATHETERIZATION  WITH CORONARY ANGIOGRAM N/A 01/12/2012   Procedure: LEFT HEART CATHETERIZATION WITH CORONARY ANGIOGRAM;  Surgeon: Burnell Blanks, MD;  Location: [facility] CATH LAB;  Service: Cardiovascular;  Laterality: N/A;    Allergies  Allergen Reactions  . Ciprofloxacin     REACTION: nause-sweating-chest tightness  . Atorvastatin     Myalgia   . Crestor [Rosuvastatin Calcium] Nausea Only  . Livalo [Pitavastatin]     myalgia    Social History   Social History  . Marital status: Single    Spouse name: N/A  . Number of children: 1  . Years of education: N/A   Occupational History  . 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 Smoking status: Former Smoker    Years: 33.00    Types: Cigarettes    Quit date: 06/13/2008  . Smokeless tobacco: Never Used     Comment: None since 2009   . Alcohol use No     Comment: former  . Drug use: No     Comment: Crack cocaine; none since Jan 2006   . Sexual activity: Not on file   Other Topics Concern  . Not on file   Social History Narrative   Regular exercise:  Walks 5-7 x weekly   Caffeine use:  1 daily   Manager on duty  at [employer]   Single   Daughter age 67 and 71 yr old grandson- daughter lives in [place].   Enjoys reading, sewing, Fill in puzzles, walking   Completed GED.   No pets.                   Family History  Problem Relation Age of Onset  . Heart attack Mother 30       prior pacemaker  . Heart disease Mother   . Diabetes Mother   . Heart failure Father        CHF  . Heart disease Father   . Alcoholism Father   . Stroke Brother     BP 124/78   Pulse 62   Ht 5\' 6"  (1.676 m)   Wt 202 lb (91.6 kg)   BMI 32.60 kg/m   Review of Systems: See HPI above.     Objective:  Physical Exam:  Gen: NAD, comfortable in exam room  Right leg/knee: No gross deformity, ecchymoses, swelling. Mild TTP anterior tibialis muscle.  No tibia, fibula, other tenderness about leg or knee. FROM with mild pain on ankle  dorsiflexion. Negative ant/post drawers. Negative valgus/varus testing. Negative lachmanns. Negative mcmurrays, apleys, patellar apprehension. NV intact distally.  Left leg/knee: FROM without pain.   Assessment & Plan:  1. Right leg pain - consistent with overuse strain of anterior tibialis.  Compression sleeve, heat, tylenol for pain.  Shown home exercises to do daily.  Sports insoles to help with arch support as well.  F/u in 1 month.

## 2017-02-23 NOTE — Telephone Encounter (Signed)
Please let patient know that her lab work is showing borderline hypothyroid.  I would like to have her repeat testing in 1 month as pended.  Is she having fatigue?   Kidney function and liver function look good.  Cholesterol still very high.  I would encourage her to consider the injectable cholesterol medication offered at the lipid clinic.

## 2017-02-26 NOTE — Telephone Encounter (Signed)
Notified pt. Scheduled lab appt for 03/20/17 to repeat thyroid studies. Will consider cholesterol medication and let us know if she decides to proceed.

## 2017-03-20 ENCOUNTER — Other Ambulatory Visit: Payer: 59

## 2017-03-26 ENCOUNTER — Encounter: Payer: Self-pay | Admitting: Family Medicine

## 2017-03-26 ENCOUNTER — Ambulatory Visit (INDEPENDENT_AMBULATORY_CARE_PROVIDER_SITE_OTHER): Payer: 59 | Admitting: Family Medicine

## 2017-03-26 ENCOUNTER — Other Ambulatory Visit (INDEPENDENT_AMBULATORY_CARE_PROVIDER_SITE_OTHER): Payer: 59

## 2017-03-26 DIAGNOSIS — E039 Hypothyroidism, unspecified: Secondary | ICD-10-CM | POA: Diagnosis not present

## 2017-03-26 DIAGNOSIS — M79604 Pain in right leg: Secondary | ICD-10-CM

## 2017-03-26 NOTE — Patient Instructions (Signed)
You are doing much better. Compression sleeve when up and walking around - use until you feel like you don't need this any longer. Heat 15 minutes at a time 3-4 times a day if needed. Tylenol 500mg  1-2 tabs three times a day as needed for pain. Do home exercises with theraband 3 sets of 10 once a day. Follow up with me as needed.

## 2017-03-27 LAB — THYROID ANTIBODIES
Thyroglobulin Ab: <1 IU/mL (ref <2)
Thyroperoxidase Ab SerPl-aCnc: 1 IU/mL (ref <9)

## 2017-03-27 LAB — T3, FREE: T3 FREE: 3.5 pg/mL (ref 2.3–4.2)

## 2017-03-27 LAB — T4, FREE: Free T4: 0.82 ng/dL (ref 0.60–1.60)

## 2017-03-27 LAB — TSH: TSH: 4.27 u[IU]/mL (ref 0.35–4.50)

## 2017-03-28 ENCOUNTER — Encounter: Payer: Self-pay | Admitting: Family

## 2017-03-29 NOTE — Assessment & Plan Note (Signed)
consistent with overuse strain of anterior tibialis.  Clinically improving.  Encouraged to do home exercises more regularly - only doing some.  Compression sleeve when up and walking around.  Heat, tylenol.  F/u prn.

## 2017-03-29 NOTE — Progress Notes (Signed)
PCP and consultation requested by: [name], NP  Subjective:   HPI: Patient is a 56 y.o. female here for right leg, knee pain.  5/15: Patient reports for about 1 month she has had about 1 month of pain in anterolateral right lower leg. No acute injury or trauma. She is on her feet a lot at work. More pain lying down after working. Pain is 4/10, can be sharp. Tried aspirin, tylenol. No skin changes, numbness.  6/18: Patient reports she is doing better. Calf sleeve helps a lot. Knee doesn't bother her much now. Doing heat, home exercises. Calf pain gets up to 5/10, can be sharp.  Knee pain 0/10. No swelling. No skin changes, numbness.  Past Medical History:  Diagnosis Date  . Abnormal TSH   . Alcohol abuse   . Cocaine abuse    Remote crack cocaine use. UDS negative for cocaine 01/2012  . Coronary artery disease     inferior STEMI 01/13/12 s/p PTCA/DES to mid RCA. Initial EF 45% by cath but improved to 55-60% by echo 01/14/12  . Dyslipidemia    Trig 201, HDL 36, LDL 197 01/2012  . GERD (gastroesophageal reflux disease)   . History of MI (myocardial infarction) 01/2012  . Hypothyroidism   . Myocardial infarction ([place]) 2012  . Umbilical hernia     Current Outpatient Prescriptions on File Prior to Visit  Medication Sig Dispense Refill  . aspirin EC 81 MG tablet Take 1 tablet (81 mg total) by mouth daily. 90 tablet 3  . loratadine (CLARITIN) 10 MG tablet Take 1 tablet (10 mg total) by mouth daily. (Patient taking differently: Take 10 mg by mouth daily as needed. ) 30 tablet 0  . mometasone (NASONEX) 50 MCG/ACT nasal spray Place 2 sprays into the nose daily. (Patient taking differently: Place 2 sprays into the nose daily as needed. ) 17 g 12  . nitroGLYCERIN (NITROSTAT) 0.4 MG SL tablet Place 1 tablet (0.4 mg total) under the tongue every 5 (five) minutes x 3 doses as needed for chest pain. 25 tablet 6   No current facility-administered medications on file prior to visit.      Past Surgical History:  Procedure Laterality Date  . ABDOMINAL HYSTERECTOMY  1981   partial  . CARDIAC CATHETERIZATION  2010, 2013   Negative. 2013--100% blockage  . HERNIA REPAIR  2017   abd hernia   . LEFT HEART CATHETERIZATION WITH CORONARY ANGIOGRAM N/A 01/12/2012   Procedure: LEFT HEART CATHETERIZATION WITH CORONARY ANGIOGRAM;  Surgeon: Burnell Blanks, MD;  Location: [facility] CATH LAB;  Service: Cardiovascular;  Laterality: N/A;    Allergies  Allergen Reactions  . Ciprofloxacin     REACTION: nause-sweating-chest tightness  . Atorvastatin     Myalgia   . Crestor [Rosuvastatin Calcium] Nausea Only  . Livalo [Pitavastatin]     myalgia    Social History   Social History  . Marital status: Single    Spouse name: N/A  . Number of children: 1  . Years of education: N/A   Occupational History  . 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 Smoking status: Former Smoker    Years: 33.00    Types: Cigarettes    Quit date: 06/13/2008  . Smokeless tobacco: Never Used     Comment: None since 2009   . Alcohol use No     Comment: former  . Drug use: No     Comment: Crack cocaine; none since Jan 2006   .  Sexual activity: Not on file   Other Topics Concern  . 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   Enjoys reading, sewing, Fill in puzzles, walking   Completed GED.   No pets.                   Family History  Problem Relation Age of Onset  . Heart attack Mother 6       prior pacemaker  . Heart disease Mother   . Diabetes Mother   . Heart failure Father        CHF  . Heart disease Father   . Alcoholism Father   . Stroke Brother     BP 137/86   Pulse (!) 58   Ht 5\' 6"  (1.676 m)   Wt 206 lb (93.4 kg)   BMI 33.25 kg/m   Review of Systems: See HPI above.      Objective:  Physical Exam:  Gen: NAD, comfortable in exam room  Right leg/knee: No gross deformity, ecchymoses, swelling. Minimal TTP anterior tibialis muscle.  No tibia, fibula, other tenderness about leg or knee. FROM without pain. Negative ant/post drawers. Negative valgus/varus testing. Negative lachmanns. Negative mcmurrays, apleys, patellar apprehension. NV intact distally.  Left leg/knee: FROM without pain.   Assessment & Plan:  1. Right leg pain - consistent with overuse strain of anterior tibialis.  Clinically improving.  Encouraged to do home exercises more regularly - only doing some.  Compression sleeve when up and walking around.  Heat, tylenol.  F/u prn.

## 2017-09-03 ENCOUNTER — Ambulatory Visit (INDEPENDENT_AMBULATORY_CARE_PROVIDER_SITE_OTHER): Payer: 59 | Admitting: Family

## 2017-09-03 ENCOUNTER — Encounter: Payer: Self-pay | Admitting: Family

## 2017-09-03 ENCOUNTER — Telehealth: Payer: Self-pay | Admitting: *Deleted

## 2017-09-03 VITALS — BP 121/82 | HR 57 | Temp 98.0°F | Resp 16 | Ht 66.0 in | Wt 203.0 lb

## 2017-09-03 DIAGNOSIS — I2510 Atherosclerotic heart disease of native coronary artery without angina pectoris: Secondary | ICD-10-CM

## 2017-09-03 DIAGNOSIS — E039 Hypothyroidism, unspecified: Secondary | ICD-10-CM | POA: Diagnosis not present

## 2017-09-03 DIAGNOSIS — E785 Hyperlipidemia, unspecified: Secondary | ICD-10-CM | POA: Diagnosis not present

## 2017-09-03 DIAGNOSIS — I2584 Coronary atherosclerosis due to calcified coronary lesion: Secondary | ICD-10-CM

## 2017-09-03 NOTE — Patient Instructions (Signed)
Please complete lab work prior to leaving.   

## 2017-09-03 NOTE — Progress Notes (Signed)
Subjective:    Patient ID: Kaitlyn Bowman, female    DOB: [birth year], 56 y.o.   MRN: [id number]  HPI  [name] is a 56 yr old female who presents today for follow up.  Hyperlipidemia- intolerant to statins and declined Repatha.   Lab Results  Component Value Date   CHOL 277 (H) 02/19/2017   HDL 37.20 (L) 02/19/2017   LDLCALC 210 (H) 01/10/2016   LDLDIRECT 167.0 02/19/2017   TRIG 333.0 (H) 02/19/2017   CHOLHDL 7 02/19/2017   CAD-s/p DES to mid RCA 2013. Maintained on aspirin. Intolerant to statins and beta blockers.  Followed by cardiology. Dr. Angelena Form.   Hypothyroid- had a brief increase in TSH 6 months ago.   Lab Results  Component Value Date   TSH 4.27 03/26/2017     Review of Systems See HPI  Past Medical History:  Diagnosis Date  . Abnormal TSH   . Alcohol abuse   . Cocaine abuse    Remote crack cocaine use. UDS negative for cocaine 01/2012  . Coronary artery disease     inferior STEMI 01/13/12 s/p PTCA/DES to mid RCA. Initial EF 45% by cath but improved to 55-60% by echo 01/14/12  . Dyslipidemia    Trig 201, HDL 36, LDL 197 01/2012  . GERD (gastroesophageal reflux disease)   . History of MI (myocardial infarction) 01/2012  . Hypothyroidism   . Myocardial infarction ([place]) 2012  . Umbilical hernia      Social History   Socioeconomic History  . Marital status: Single    Spouse name: Not on file  . Number of children: 1  . Years of education: Not on file  . Highest education level: Not on file  Social Needs  . Financial resource strain: Not on file  . Food insecurity - worry: Not on file  . Food insecurity - inability: Not on file  . Transportation needs - medical: Not on file  . Transportation needs - non-medical: Not on file  Occupational History  . Occupation: Best boy: [employer]  Tobacco Use  . Smoking status: Former Smoker    Years: 33.00    Types: Cigarettes    Last attempt to quit: 06/13/2008    Years since quitting: 9.2  . Smokeless  tobacco: Never Used  . Tobacco comment: None since 2009   Substance and Sexual Activity  . Alcohol use: No    Comment: former  . Drug use: No    Comment: Crack cocaine; none since Jan 2006   . Sexual activity: Not on file  Other Topics Concern  . 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.   Enjoys reading, sewing, Fill in puzzles, walking   Completed GED.   No pets.                   Past Surgical History:  Procedure Laterality Date  . ABDOMINAL HYSTERECTOMY  1981   partial  . CARDIAC CATHETERIZATION  2010, 2013   Negative. 2013--100% blockage  . HERNIA REPAIR  2017   abd hernia   . LEFT HEART CATHETERIZATION WITH CORONARY ANGIOGRAM N/A 01/12/2012   Procedure: LEFT HEART CATHETERIZATION WITH CORONARY ANGIOGRAM;  Surgeon: Burnell Blanks, MD;  Location: [facility]  CATH LAB;  Service: Cardiovascular;  Laterality: N/A;    Family History  Problem Relation Age of Onset  . Heart attack Mother 21       prior pacemaker  . Heart disease Mother   . Diabetes Mother   . Heart failure Father        CHF  . Heart disease Father   . Alcoholism Father   . Stroke Brother     Allergies  Allergen Reactions  . Ciprofloxacin     REACTION: nause-sweating-chest tightness  . Atorvastatin     Myalgia   . Crestor [Rosuvastatin Calcium] Nausea Only  . Livalo [Pitavastatin]     myalgia    Current Outpatient Medications on File Prior to Visit  Medication Sig Dispense Refill  . aspirin EC 81 MG tablet Take 1 tablet (81 mg total) by mouth daily. 90 tablet 3  . loratadine (CLARITIN) 10 MG tablet Take 1 tablet (10 mg total) by mouth daily. (Patient taking differently: Take 10 mg by mouth daily as needed. ) 30 tablet 0  . mometasone (NASONEX) 50 MCG/ACT nasal spray Place 2 sprays into the nose daily. (Patient taking  differently: Place 2 sprays into the nose daily as needed. ) 17 g 12  . nitroGLYCERIN (NITROSTAT) 0.4 MG SL tablet Place 1 tablet (0.4 mg total) under the tongue every 5 (five) minutes x 3 doses as needed for chest pain. 25 tablet 6   No current facility-administered medications on file prior to visit.     BP 121/82 (BP Location: Right Arm, Patient Position: Sitting, Cuff Size: Large)   Pulse (!) 57   Temp 98 F (36.7 C) (Oral)   Resp 16   Ht 5\' 6"  (1.676 m)   Wt 203 lb (92.1 kg)   SpO2 99%   BMI 32.77 kg/m       Objective:   Physical Exam  Constitutional: She is oriented to person, place, and time. She appears well-developed and well-nourished.  HENT:  Head: Normocephalic and atraumatic.  Cardiovascular: Normal rate, regular rhythm and normal heart sounds.  No murmur heard. Pulmonary/Chest: Effort normal and breath sounds normal. No respiratory distress. She has no wheezes.  Musculoskeletal: She exhibits no edema.  Neurological: She is alert and oriented to person, place, and time.  Psychiatric: She has a normal mood and affect. Her behavior is normal. Judgment and thought content normal.          Assessment & Plan:

## 2017-09-03 NOTE — Telephone Encounter (Signed)
Faxed records request to Dr Lynne Leader @ [facility] [phone]) to obtain most recent colonoscopy result. CareEverywhere indicates it was done 01/30/17 but no result is available. Awaiting fax.

## 2017-09-04 ENCOUNTER — Encounter: Payer: Self-pay | Admitting: Family

## 2017-09-04 LAB — TSH: TSH: 4.09 u[IU]/mL (ref 0.35–4.50)

## 2017-09-04 LAB — LIPID PANEL
CHOL/HDL RATIO: 7
Cholesterol: 253 mg/dL — ABNORMAL HIGH (ref 0–200)
HDL: 38.1 mg/dL — ABNORMAL LOW (ref >39.00)
NonHDL: 215.23
Triglycerides: 207 mg/dL — ABNORMAL HIGH (ref 0.0–149.0)
VLDL: 41.4 mg/dL — AB (ref 0.0–40.0)

## 2017-09-04 LAB — LDL CHOLESTEROL, DIRECT: LDL DIRECT: 162 mg/dL

## 2017-09-04 NOTE — Assessment & Plan Note (Signed)
Clinically stable, obtain follow up tsh. No on synthroid.

## 2017-09-04 NOTE — Assessment & Plan Note (Signed)
Clinically stable, management per cardiology.  

## 2017-09-04 NOTE — Assessment & Plan Note (Signed)
We had a long discussion about decreasing her cardiac risk and considering Repatha. She will think about it.

## 2017-09-28 ENCOUNTER — Encounter: Payer: 59 | Admitting: Family

## 2017-11-12 ENCOUNTER — Encounter: Payer: 59 | Admitting: Family

## 2018-03-06 ENCOUNTER — Encounter: Payer: Self-pay | Admitting: Family

## 2018-03-06 ENCOUNTER — Encounter

## 2018-03-06 ENCOUNTER — Ambulatory Visit (INDEPENDENT_AMBULATORY_CARE_PROVIDER_SITE_OTHER): Payer: BLUE CROSS/BLUE SHIELD | Admitting: Family

## 2018-03-06 VITALS — BP 134/77 | HR 60 | Temp 98.1°F | Resp 16 | Ht 66.0 in | Wt 201.2 lb

## 2018-03-06 DIAGNOSIS — E785 Hyperlipidemia, unspecified: Secondary | ICD-10-CM

## 2018-03-06 DIAGNOSIS — I2510 Atherosclerotic heart disease of native coronary artery without angina pectoris: Secondary | ICD-10-CM | POA: Diagnosis not present

## 2018-03-06 DIAGNOSIS — M25561 Pain in right knee: Secondary | ICD-10-CM

## 2018-03-06 DIAGNOSIS — E039 Hypothyroidism, unspecified: Secondary | ICD-10-CM

## 2018-03-06 LAB — TSH: TSH: 6.48 u[IU]/mL — ABNORMAL HIGH (ref 0.35–4.50)

## 2018-03-06 MED ORDER — MELOXICAM 7.5 MG PO TABS: 7.5000 mg | ORAL_TABLET | Freq: Every day | ORAL | 0 refills | Status: DC

## 2018-03-06 NOTE — Progress Notes (Addendum)
Subjective:    Patient ID: Kaitlyn Bowman, female    DOB: Feb 07, 1961, 57 y.o.   MRN: [id number]  HPI   Kaitlyn Bowman is a 57 y r old female who presents today for follow up:  1) R knee pain- reports pain started yesterday.  2) Hyperlipidemia-  Lab Results  Component Value Date   CHOL 253 (H) 09/03/2017   HDL 38.10 (L) 09/03/2017   LDLCALC 210 (H) 01/10/2016   LDLDIRECT 162.0 09/03/2017   TRIG 207.0 (H) 09/03/2017   CHOLHDL 7 09/03/2017   4) CAD- reports clinically stable.  Overdue for follow up with cardiology as she recently obtained health insurance.   5) Hypothyroid-  Last tsh stable, not on synthroid.     Review of Systems See HPI  Past Medical History:  Diagnosis Date  . Abnormal TSH   . Alcohol abuse   . Cocaine abuse ([place])    Remote crack cocaine use. UDS negative for cocaine 01/2012  . Coronary artery disease     inferior STEMI 01/13/12 s/p PTCA/DES to mid RCA. Initial EF 45% by cath but improved to 55-60% by echo 01/14/12  . Dyslipidemia    Trig 201, HDL 36, LDL 197 01/2012  . GERD (gastroesophageal reflux disease)   . History of MI (myocardial infarction) 01/2012  . Hypothyroidism   . Myocardial infarction ([place]) 2012  . Umbilical hernia      Social History   Socioeconomic History  . Marital status: Single    Spouse name: Not on file  . Number of children: 1  . Years of education: Not on file  . Highest education level: Not on file  Occupational History  . Occupation: Best boy: [employer]  Social Needs  . Financial resource strain: Not on file  . Food insecurity:    Worry: Not on file    Inability: Not on file  . Transportation needs:    Medical: Not on file    Non-medical: Not on file  Tobacco Use  . Smoking status: Former Smoker    Years: 33.00    Types: Cigarettes    Last attempt to quit: 06/13/2008    Years since quitting: 9.7  . Smokeless tobacco: Never Used  . Tobacco comment: None since 2009   Substance and Sexual Activity  .  Alcohol use: No    Comment: former  . Drug use: No    Types: "Crack" cocaine    Comment: Crack cocaine; none since Jan 2006   . Sexual activity: Not on file  Lifestyle  . Physical activity:    Days per week: Not on file    Minutes per session: Not on file  . Stress: Not on file  Relationships  . Social connections:    Talks on phone: Not on file    Gets together: Not on file    Attends religious service: Not on file    Active member of club or organization: Not on file    Attends meetings of clubs or organizations: Not on file    Relationship status: Not on file  . Intimate partner violence:    Fear of current or ex partner: Not on file    Emotionally abused: Not on file    Physically abused: Not on file    Forced sexual activity: Not on file  Other Topics Concern  . Not on file  Social History Narrative   Regular exercise:  Walks 5-7 x weekly   Caffeine use:  1 daily   Works at an independent living   Single   Daughter age 62 and 45 yr old grandson- daughter lives in [place].   Enjoys reading, sewing, Fill in puzzles, walking   Completed GED.   No pets.                Past Surgical History:  Procedure Laterality Date  . ABDOMINAL HYSTERECTOMY  1981   partial  . CARDIAC CATHETERIZATION  2010, 2013   Negative. 2013--100% blockage  . HERNIA REPAIR  2017   abd hernia   . LEFT HEART CATHETERIZATION WITH CORONARY ANGIOGRAM N/A 01/12/2012   Procedure: LEFT HEART CATHETERIZATION WITH CORONARY ANGIOGRAM;  Surgeon: Burnell Blanks, MD;  Location: [facility] CATH LAB;  Service: Cardiovascular;  Laterality: N/A;    Family History  Problem Relation Age of Onset  . Heart attack Mother 48       prior pacemaker  . Heart disease Mother   . Diabetes Mother   . Heart failure Father        CHF  . Heart disease Father   . Alcoholism Father   . Stroke Brother     Allergies  Allergen Reactions  . Ciprofloxacin     REACTION: nause-sweating-chest tightness  . Atorvastatin       Myalgia   . Crestor [Rosuvastatin Calcium] Nausea Only  . Livalo [Pitavastatin]     myalgia    Current Outpatient Medications on File Prior to Visit  Medication Sig Dispense Refill  . aspirin EC 81 MG tablet Take 1 tablet (81 mg total) by mouth daily. 90 tablet 3  . loratadine (CLARITIN) 10 MG tablet Take 1 tablet (10 mg total) by mouth daily. (Patient taking differently: Take 10 mg by mouth daily as needed. ) 30 tablet 0  . mometasone (NASONEX) 50 MCG/ACT nasal spray Place 2 sprays into the nose daily. (Patient taking differently: Place 2 sprays into the nose daily as needed. ) 17 g 12  . nitroGLYCERIN (NITROSTAT) 0.4 MG SL tablet Place 1 tablet (0.4 mg total) under the tongue every 5 (five) minutes x 3 doses as needed for chest pain. 25 tablet 6   No current facility-administered medications on file prior to visit.     BP 134/77 (BP Location: Right Arm, Patient Position: Sitting, Cuff Size: Small)   Pulse 60   Temp 98.1 F (36.7 C) (Oral)   Resp 16   Ht 5\' 6"  (1.676 m)   Wt 201 lb 3.2 oz (91.3 kg)   SpO2 100%   BMI 32.47 kg/m       Objective:   Physical Exam  Constitutional: She appears well-developed and well-nourished.  Cardiovascular: Normal rate, regular rhythm and normal heart sounds.  No murmur heard. Pulmonary/Chest: Effort normal and breath sounds normal. No respiratory distress. She has no wheezes.  Musculoskeletal:  Mild right knee swelling noted, no warmth  Psychiatric: She has a normal mood and affect. Her behavior is normal. Judgment and thought content normal.          Assessment & Plan:  Sub-clinical Hypothyroid-follow up tsh  R knee pain- new. Trial of meloxicam. Advised pt to let me know if not improved in 1 week and we will plan referral to sports med.  CAD- clinically stable- she will arrange follow up with cardiology. Recently obtained health insurance.  Hyperlipidemia- statin intolerant- hopefully cardiology can re-evaluate her  eligibility for PCSK9 with her new insurance.

## 2018-03-06 NOTE — Patient Instructions (Signed)
Please scheduled follow up with Dr. Julianne Handler. Start meloxicam once daily. Call if not improved in 1 week.

## 2018-03-07 ENCOUNTER — Other Ambulatory Visit (INDEPENDENT_AMBULATORY_CARE_PROVIDER_SITE_OTHER): Payer: BLUE CROSS/BLUE SHIELD

## 2018-03-07 ENCOUNTER — Telehealth: Payer: Self-pay | Admitting: Family

## 2018-03-07 DIAGNOSIS — E038 Other specified hypothyroidism: Secondary | ICD-10-CM | POA: Diagnosis not present

## 2018-03-07 DIAGNOSIS — E039 Hypothyroidism, unspecified: Secondary | ICD-10-CM

## 2018-03-07 LAB — T3, FREE: T3 FREE: 3.7 pg/mL (ref 2.3–4.2)

## 2018-03-07 LAB — T4, FREE: FREE T4: 0.75 ng/dL (ref 0.60–1.60)

## 2018-03-07 NOTE — Telephone Encounter (Signed)
Please contact pt and let her know that her lab work shows borderline low thyroid. Is she having fatigue? If so, please send rx for her to start synthroid 25mcg once daily and repeat TSH in 6 week, dx subclinical hypothyroid.  In no fatigue- just check tsh in 6 weeks.

## 2018-03-07 NOTE — Telephone Encounter (Signed)
Could you please contact the lab and ask them to add on free t3 and free t4 to her labs?

## 2018-03-07 NOTE — Telephone Encounter (Signed)
Order faxed to [facility] to add test.

## 2018-03-08 NOTE — Telephone Encounter (Signed)
Left message for pt to return my call.

## 2018-03-11 NOTE — Telephone Encounter (Signed)
OK, no further recommendations at this time.

## 2018-03-11 NOTE — Telephone Encounter (Signed)
Notified pt of below. Pt states she is tired a lot but feels this is due to her new job. We discussed other symptoms of weight gain, constipation, hair loss and pt denies other symptoms. Pt wants to wait and recheck lab in 6 weeks before trying any medication. I scheduled lab appt for 04/22/18 at 4pm for TSH. Any other labs at that time? Any other recommendations? Please advise?

## 2018-03-11 NOTE — Telephone Encounter (Signed)
Patient returning tricia's call. Tricia in room with patient.

## 2018-03-18 ENCOUNTER — Ambulatory Visit (HOSPITAL_BASED_OUTPATIENT_CLINIC_OR_DEPARTMENT_OTHER)
Admission: RE | Admit: 2018-03-18 | Discharge: 2018-03-18 | Disposition: A | Discharge status: Home / Self Care | Payer: BLUE CROSS/BLUE SHIELD | Source: Ambulatory Visit | Attending: Medical | Admitting: Medical

## 2018-03-18 ENCOUNTER — Ambulatory Visit (INDEPENDENT_AMBULATORY_CARE_PROVIDER_SITE_OTHER): Payer: BLUE CROSS/BLUE SHIELD | Admitting: Medical

## 2018-03-18 ENCOUNTER — Encounter: Payer: Self-pay | Admitting: Medical

## 2018-03-18 ENCOUNTER — Ambulatory Visit: Payer: Self-pay

## 2018-03-18 VITALS — BP 110/72 | HR 84 | Temp 98.1°F | Resp 16 | Ht 66.0 in | Wt 203.8 lb

## 2018-03-18 DIAGNOSIS — R739 Hyperglycemia, unspecified: Secondary | ICD-10-CM

## 2018-03-18 DIAGNOSIS — R5383 Other fatigue: Secondary | ICD-10-CM

## 2018-03-18 DIAGNOSIS — W57XXXA Bitten or stung by nonvenomous insect and other nonvenomous arthropods, initial encounter: Secondary | ICD-10-CM | POA: Diagnosis not present

## 2018-03-18 DIAGNOSIS — M79609 Pain in unspecified limb: Secondary | ICD-10-CM

## 2018-03-18 DIAGNOSIS — M791 Myalgia, unspecified site: Secondary | ICD-10-CM

## 2018-03-18 DIAGNOSIS — M79604 Pain in right leg: Secondary | ICD-10-CM | POA: Insufficient documentation

## 2018-03-18 DIAGNOSIS — S70361A Insect bite (nonvenomous), right thigh, initial encounter: Secondary | ICD-10-CM

## 2018-03-18 DIAGNOSIS — G44209 Tension-type headache, unspecified, not intractable: Secondary | ICD-10-CM | POA: Diagnosis not present

## 2018-03-18 DIAGNOSIS — R350 Frequency of micturition: Secondary | ICD-10-CM

## 2018-03-18 LAB — POC URINALSYSI DIPSTICK (AUTOMATED)
Bilirubin, UA: NEGATIVE
Blood, UA: NEGATIVE
GLUCOSE UA: NEGATIVE
KETONES UA: NEGATIVE
LEUKOCYTES UA: NEGATIVE
Nitrite, UA: NEGATIVE
PROTEIN UA: NEGATIVE
Urobilinogen, UA: NEGATIVE E.U./dL — AB
pH, UA: 6 (ref 5.0–8.0)

## 2018-03-18 MED ORDER — KETOROLAC TROMETHAMINE 30 MG/ML IJ SOLN
30.0000 mg | Freq: Once | INTRAMUSCULAR | Status: AC
  Administered 2018-03-18: 30 mg via INTRAMUSCULAR

## 2018-03-18 NOTE — Progress Notes (Signed)
Subjective:    Patient ID: Kaitlyn Bowman, female    DOB: 08-20-1961, 57 y.o.   MRN: [id number] HPI  Pt in for follow up.  Pt gives me update me new job from when I last saw her. Pt job is very taxing(some high level stress also since mother died months ago). Cleans rooms and serves food. Pt states does thinks she feels better since hydrating more/better. She has been having some dry mouth and some blurred vision at time.  Pt states Friday, Saturday and Sunday had ha. HA started to subside yesterday. Minimal now.  Pt states some muscle aches in her calfs and some rt knee pain. Pain in popliteal area at times. Pt states given meloxicam and she thinks it made her muscles worse. So she stopped it.  Pt had mark on her rt distal thigh. She thinks tick may have bit her but she did not see tick. She notes she saw that bite mark about one month or so ago. She remember scratching something on her rt thigh aggresivley one night while sleeping.   Review of Systems  Constitutional: Positive for fatigue. Negative for chills and fever.  HENT: Negative for congestion.   Eyes: Negative for pain, redness and itching.  Respiratory: Negative for cough, chest tightness, shortness of breath and wheezing.   Cardiovascular: Negative for chest pain and palpitations.  Gastrointestinal: Negative for abdominal distention, abdominal pain, anal bleeding, constipation and diarrhea.  Endocrine: Positive for polydipsia and polyuria.  Genitourinary: Negative for dysuria, flank pain, pelvic pain, urgency and vaginal pain.  Musculoskeletal: Positive for myalgias. Negative for back pain and neck pain.       Calf are sore.  Skin: Negative for rash.  Neurological: Negative for dizziness, speech difficulty, weakness, numbness and headaches.       Over the weekend some ha and dizziness.  Hematological: Negative for adenopathy. Does not bruise/bleed easily.  Psychiatric/Behavioral: Negative for behavioral problems and  confusion.   Past Medical History:  Diagnosis Date  . Abnormal TSH   . Alcohol abuse   . Cocaine abuse ([place])    Remote crack cocaine use. UDS negative for cocaine 01/2012  . Coronary artery disease     inferior STEMI 01/13/12 s/p PTCA/DES to mid RCA. Initial EF 45% by cath but improved to 55-60% by echo 01/14/12  . Dyslipidemia    Trig 201, HDL 36, LDL 197 01/2012  . GERD (gastroesophageal reflux disease)   . History of MI (myocardial infarction) 01/2012  . Hypothyroidism   . Myocardial infarction ([place]) 2012  . Umbilical hernia      Social History   Socioeconomic History  . Marital status: Single    Spouse name: Not on file  . Number of children: 1  . Years of education: Not on file  . Highest education level: Not on file  Occupational History  . Occupation: Best boy: [employer]  Social Needs  . Financial resource strain: Not on file  . Food insecurity:    Worry: Not on file    Inability: Not on file  . Transportation needs:    Medical: Not on file    Non-medical: Not on file  Tobacco Use  . Smoking status: Former Smoker    Years: 33.00    Types: Cigarettes    Last attempt to quit: 06/13/2008    Years since quitting: 9.7  . Smokeless tobacco: Never Used  . Tobacco comment: None since 2009   Substance and Sexual  Activity  . Alcohol use: No    Comment: former  . Drug use: No    Types: "Crack" cocaine    Comment: Crack cocaine; none since Jan 2006   . Sexual activity: Not on file  Lifestyle  . Physical activity:    Days per week: Not on file    Minutes per session: Not on file  . Stress: Not on file  Relationships  . Social connections:    Talks on phone: Not on file    Gets together: Not on file    Attends religious service: Not on file    Active member of club or organization: Not on file    Attends meetings of clubs or organizations: Not on file    Relationship status: Not on file  . Intimate partner violence:    Fear of current or ex partner: Not on  file    Emotionally abused: Not on file    Physically abused: Not on file    Forced sexual activity: Not on file  Other Topics Concern  . Not on file  Social History Narrative   Regular exercise:  Walks 5-7 x weekly   Caffeine use:  1 daily   Works at an independent living   Single   Daughter age 32 and 78 yr old grandson- daughter lives in [place].   Enjoys reading, sewing, Fill in puzzles, walking   Completed GED.   No pets.                Past Surgical History:  Procedure Laterality Date  . ABDOMINAL HYSTERECTOMY  1981   partial  . CARDIAC CATHETERIZATION  2010, 2013   Negative. 2013--100% blockage  . HERNIA REPAIR  2017   abd hernia   . LEFT HEART CATHETERIZATION WITH CORONARY ANGIOGRAM N/A 01/12/2012   Procedure: LEFT HEART CATHETERIZATION WITH CORONARY ANGIOGRAM;  Surgeon: Burnell Blanks, MD;  Location: [facility] CATH LAB;  Service: Cardiovascular;  Laterality: N/A;    Family History  Problem Relation Age of Onset  . Heart attack Mother 61       prior pacemaker  . Heart disease Mother   . Diabetes Mother   . Heart failure Father        CHF  . Heart disease Father   . Alcoholism Father   . Stroke Brother     Allergies  Allergen Reactions  . Ciprofloxacin     REACTION: nause-sweating-chest tightness  . Atorvastatin     Myalgia   . Crestor [Rosuvastatin Calcium] Nausea Only  . Livalo [Pitavastatin]     myalgia    Current Outpatient Medications on File Prior to Visit  Medication Sig Dispense Refill  . aspirin EC 81 MG tablet Take 1 tablet (81 mg total) by mouth daily. 90 tablet 3  . loratadine (CLARITIN) 10 MG tablet Take 1 tablet (10 mg total) by mouth daily. (Patient taking differently: Take 10 mg by mouth daily as needed. ) 30 tablet 0  . mometasone (NASONEX) 50 MCG/ACT nasal spray Place 2 sprays into the nose daily. (Patient taking differently: Place 2 sprays into the nose daily as needed. ) 17 g 12  . nitroGLYCERIN (NITROSTAT) 0.4 MG SL tablet  Place 1 tablet (0.4 mg total) under the tongue every 5 (five) minutes x 3 doses as needed for chest pain. 25 tablet 6   No current facility-administered medications on file prior to visit.     BP 110/72   Pulse 84   Temp 98.1 F (  36.7 C) (Oral)   Resp 16   Ht 5\' 6"  (1.676 m)   Wt 203 lb 12.8 oz (92.4 kg)   SpO2 95%   BMI 32.89 kg/m       Objective:   Physical Exam   General Mental Status- Alert. General Appearance- Not in acute distress.   Skin General: Color- Normal Color. Moisture- Normal Moisture.  Neck Carotid Arteries- Normal color. Moisture- Normal Moisture. No carotid bruits. No JVD.  Chest and Lung Exam Auscultation: Breath Sounds:-Normal.  Cardiovascular Auscultation:Rythm- Regular. Murmurs & Other Heart Sounds:Auscultation of the heart reveals- No Murmurs.  Abdomen Inspection:-Inspeection Normal. Palpation/Percussion:Note:No mass. Palpation and Percussion of the abdomen reveal- Non Tender, Non Distended + BS, no rebound or guarding.    Neurologic Cranial Nerve exam:- CN III-XII intact(No nystagmus), symmetric smile. Strength:- 5/5 equal and symmetric strength both upper and lower extremities. Finger to nose intact  Calves-symmetric. No edema. Negative homans sign left side. Rt side popliteal pain on palpation.  Rt knee- good range of motion no obvious pain.  Skin- rt distal thigh- small hyperpigmented spot.(old tick bite site?)    Assessment & Plan:  Your sugar is mildly high recently.  We will get A1c today.  If results indicate diabetes then will need to prescribe you medication.  For fatigue, I am getting CBC and metabolic panel.  For your headaches, I want you to make sure that you are well-hydrated on daily basis.  You might supplement fluid intake with propel fitness water.  This is like Gatorade but it does not have sugar.  Also considering that she might have some tension headache component.  Try to relax.  Presently can take Tylenol for  mild headaches.  If headache gets worse with any neurologic or motor type deficits then recommend ED evaluation.  For myalgias and joint pains getting arthritis panel.  Also for possible tick bite getting tick bite antibody studies.  Please get ultrasound of right lower extremity tonight.  You can get that done at 8 PM.  You to register at the emergency department and then radiology staff will come get you.  Will evaluate if you have a Baker's cyst versus DVT.  Follow-up in 7 to 10 days or as needed.

## 2018-03-18 NOTE — Telephone Encounter (Signed)
Pt c/o mild headache that is located to her "entire head" x 2 weeks. She stated that she ate a piece of ham 2 weeks ago and she has been having a headache ever since. Pt stated that she is having muscle aches and sweating a lot. Pt states that she has had occasional lightheadedness. Pt took 1 extra strength acetaminophen for the pain. Pt does not take ibuprofen or naprosyn. Care advice given per protocol. PCP not available for pt appt due to pt time restraints. Appt made with Mackie Pai PA today for 3:40 today. Reason for Disposition . [1] New headache AND [2] age > 20  Answer Assessment - Initial Assessment Questions 1. LOCATION: "Where does it hurt?"      Whole head and left temple  2. ONSET: "When did the headache start?" (Minutes, hours or days)      2 weeks ago 3. PATTERN: "Does the pain come and go, or has it been constant since it started?"     Comes and goes 4. SEVERITY: "How bad is the pain?" and "What does it keep you from doing?"  (e.g., Scale 1-10; mild, moderate, or severe)   - MILD (1-3): doesn't interfere with normal activities    - MODERATE (4-7): interferes with normal activities or awakens from sleep    - SEVERE (8-10): excruciating pain, unable to do any normal activities        mild 5. RECURRENT SYMPTOM: "Have you ever had headaches before?" If so, ask: "When was the last time?" and "What happened that time?"      no 6. CAUSE: "What do you think is causing the headache?"     Ham 2 weeks ago 7. MIGRAINE: "Have you been diagnosed with migraine headaches?" If so, ask: "Is this headache similar?"      no 8. HEAD INJURY: "Has there been any recent injury to the head?"      no 9. OTHER SYMPTOMS: "Do you have any other symptoms?" (fever, stiff neck, eye pain, sore throat, cold symptoms)     lightheadedness 10. PREGNANCY: "Is there any chance you are pregnant?" "When was your last menstrual period?"       n/a  Protocols used: HEADACHE-A-AH

## 2018-03-18 NOTE — Patient Instructions (Addendum)
Your sugar is mildly high recently.  We will get A1c today.  If results indicate diabetes then will need to prescribe you medication.  For fatigue, I am getting CBC and metabolic panel.  For your headaches, I want you to make sure that you are well-hydrated on daily basis.  You might supplement fluid intake with propel fitness water.  This is like Gatorade but it does not have sugar.  Also considering that she might have some tension headache component.  Try to relax.  Presently can take Tylenol for mild headaches.  If headache gets worse with any neurologic or motor type deficits then recommend ED evaluation.  Today in the office we gave you Toradol to treat your headache, calf pain and knee pain.  This should also help pain behind the knee as well.  For myalgias and joint pains getting arthritis panel.  Also for possible tick bite getting tick bite antibody studies.  Please get ultrasound of right lower extremity tonight.  You can get that done at 8 PM.  You to register at the emergency department and then radiology staff will come get you.  Will evaluate if you have a Baker's cyst versus DVT.  Follow-up in 7 to 10 days or as needed.

## 2018-03-19 ENCOUNTER — Telehealth: Payer: Self-pay | Admitting: Medical

## 2018-03-19 DIAGNOSIS — M79661 Pain in right lower leg: Secondary | ICD-10-CM

## 2018-03-19 LAB — CBC WITH DIFFERENTIAL/PLATELET
BASOS ABS: 0.2 10*3/uL — AB (ref 0.0–0.1)
Basophils Relative: 1.6 % (ref 0.0–3.0)
EOS ABS: 0.1 10*3/uL (ref 0.0–0.7)
Eosinophils Relative: 1.3 % (ref 0.0–5.0)
HEMATOCRIT: 38.5 % (ref 36.0–46.0)
Hemoglobin: 12.5 g/dL (ref 12.0–15.0)
LYMPHS PCT: 42.1 % (ref 12.0–46.0)
Lymphs Abs: 4.2 10*3/uL — ABNORMAL HIGH (ref 0.7–4.0)
MCHC: 32.6 g/dL (ref 30.0–36.0)
MCV: 80.9 fl (ref 78.0–100.0)
MONO ABS: 0.4 10*3/uL (ref 0.1–1.0)
Monocytes Relative: 4.1 % (ref 3.0–12.0)
NEUTROS ABS: 5.1 10*3/uL (ref 1.4–7.7)
Neutrophils Relative %: 50.9 % (ref 43.0–77.0)
Platelets: 357 10*3/uL (ref 150.0–400.0)
RBC: 4.75 Mil/uL (ref 3.87–5.11)
RDW: 15.6 % — ABNORMAL HIGH (ref 11.5–15.5)
WBC: 10 10*3/uL (ref 4.0–10.5)

## 2018-03-19 LAB — COMPREHENSIVE METABOLIC PANEL
ALBUMIN: 4.7 g/dL (ref 3.5–5.2)
ALT: 23 U/L (ref 0–35)
AST: 21 U/L (ref 0–37)
Alkaline Phosphatase: 90 U/L (ref 39–117)
BILIRUBIN TOTAL: 0.2 mg/dL (ref 0.2–1.2)
BUN: 22 mg/dL (ref 6–23)
CO2: 26 meq/L (ref 19–32)
CREATININE: 1.02 mg/dL (ref 0.40–1.20)
Calcium: 10.4 mg/dL (ref 8.4–10.5)
Chloride: 105 mEq/L (ref 96–112)
GFR: 71.69 mL/min (ref >60.00)
Glucose, Bld: 85 mg/dL (ref 70–99)
Potassium: 4.2 mEq/L (ref 3.5–5.1)
Sodium: 140 mEq/L (ref 135–145)
Total Protein: 8.2 g/dL (ref 6.0–8.3)

## 2018-03-19 LAB — C-REACTIVE PROTEIN: CRP: 1.5 mg/dL (ref 0.5–20.0)

## 2018-03-19 LAB — SEDIMENTATION RATE: Sed Rate: 88 mm/hr — ABNORMAL HIGH (ref 0–30)

## 2018-03-19 LAB — HEMOGLOBIN A1C: Hgb A1c MFr Bld: 6.1 % (ref 4.6–6.5)

## 2018-03-19 NOTE — Telephone Encounter (Signed)
This encounter was created in error - please disregard.

## 2018-03-19 NOTE — Telephone Encounter (Signed)
Referral to sports med placed.

## 2018-03-20 ENCOUNTER — Telehealth: Payer: Self-pay | Admitting: Medical

## 2018-03-20 LAB — ROCKY MTN SPOTTED FVR ABS PNL(IGG+IGM)
RMSF IGG: NOT DETECTED
RMSF IGM: NOT DETECTED

## 2018-03-20 LAB — ANA: Anti Nuclear Antibody(ANA): NEGATIVE

## 2018-03-20 LAB — B. BURGDORFI ANTIBODIES

## 2018-03-20 LAB — RHEUMATOID FACTOR

## 2018-03-20 NOTE — Telephone Encounter (Signed)
Did call patient and notified of sed rate elevated and need to repeat lyme test. Advised we can repeat lyme and see if they can run test.  Pt declines lipid panel test as she states knows it is high and she needs make decision on repatha.  Notified referred her to rheumatologist.

## 2018-03-21 ENCOUNTER — Telehealth: Payer: Self-pay | Admitting: Cardiovascular Disease

## 2018-03-21 NOTE — Telephone Encounter (Signed)
New Message   Pt states her pcp told her she has fat in her blood and she says shes scared and would like to speak with a nurse. Please call

## 2018-03-21 NOTE — Telephone Encounter (Signed)
Discussed with patient.  Patients tells me that she is can't take statins and they tried Repatha. Informed pt she was overdue for yearly follow up w/ [facility].  Will forward to his nurse to address/schedule follow up with patient.

## 2018-03-25 ENCOUNTER — Telehealth: Payer: Self-pay | Admitting: *Deleted

## 2018-03-25 ENCOUNTER — Telehealth: Payer: Self-pay | Admitting: Medical

## 2018-03-25 DIAGNOSIS — M791 Myalgia, unspecified site: Secondary | ICD-10-CM

## 2018-03-25 DIAGNOSIS — R700 Elevated erythrocyte sedimentation rate: Secondary | ICD-10-CM

## 2018-03-25 NOTE — Telephone Encounter (Signed)
Referral to rhematologist placed.

## 2018-03-25 NOTE — Telephone Encounter (Signed)
Copied from [place]. Topic: Referral - Status >> Mar 21, 2018  3:35 PM Mcneil, Ja-Kwan wrote: Reason for CRM: Pt states she was told she would be referred to a Rheumatologist but she has not heard from anyone in regards to scheduling. Pt requests a return call. Cb# [phone]

## 2018-03-25 NOTE — Telephone Encounter (Signed)
Kaitlyn Bowman -- I do not see a rheumatology referral in [place]. Can you place referral if appropriate?

## 2018-03-25 NOTE — Telephone Encounter (Signed)
Saw her past Monday and decided would refer her Wednesday night. Just placed the referral. Meant to place it on Thursday and got busy. Notify pt it has been placed. Hopefully they will get her in relatively quickly. But rheumatologist I think sometimes can take weeks.

## 2018-03-26 NOTE — Telephone Encounter (Signed)
I spoke with Kaitlyn Bowman. She is willing to start injectable medication for cholesterol.  I scheduled her for appointment in lipid clinic on July 11,2019 at 4:00.  Kaitlyn Bowman is due for follow up in our office.  I offered her appointment with PA or NP but she would like to wait and see Kaitlyn Bowman in October.  Appt made with Kaitlyn Bowman for October 16,2019 at 4:20

## 2018-03-26 NOTE — Telephone Encounter (Signed)
Notified pt and she voices understanding. She was advised to call us in 1 week if she has not been contacted to schedule an appointment.

## 2018-04-01 ENCOUNTER — Ambulatory Visit (INDEPENDENT_AMBULATORY_CARE_PROVIDER_SITE_OTHER): Payer: BLUE CROSS/BLUE SHIELD | Admitting: Family Medicine

## 2018-04-01 ENCOUNTER — Encounter: Payer: Self-pay | Admitting: Family Medicine

## 2018-04-01 DIAGNOSIS — M25561 Pain in right knee: Secondary | ICD-10-CM | POA: Diagnosis not present

## 2018-04-01 MED ORDER — METHYLPREDNISOLONE ACETATE 40 MG/ML IJ SUSP
40.0000 mg | Freq: Once | INTRAMUSCULAR | Status: AC
  Administered 2018-04-01: 40 mg via INTRA_ARTICULAR

## 2018-04-01 NOTE — Patient Instructions (Signed)
Your pain is due to a lateral meniscus tear, less likely a flare of arthritis. These are the different medications you can take for this: Tylenol 500mg  1-2 tabs three times a day for pain. Capsaicin, aspercreme, or biofreeze topically up to four times a day may also help with pain. Some supplements that may help for arthritis: Boswellia extract, curcumin, pycnogenol Aleve 1-2 tabs twice a day with food Cortisone injections are an option - you were given this today. It's important that you continue to stay active. Straight leg raises, knee extensions 3 sets of 10 once a day (add ankle weight if these become too easy).  Ankle strengthening with yellow theraband for peroneal spasms also 3 sets of 10 once a day. Consider physical therapy to strengthen muscles around the joint that hurts to take pressure off of the joint itself. Shoe inserts with good arch support may be helpful. Heat or ice 15 minutes at a time 3-4 times a day as needed to help with pain. Follow up with me in 1 month. Continue wearing the sleeve. We will consider further imaging if you're not improving as expected.

## 2018-04-02 ENCOUNTER — Encounter: Payer: Self-pay | Admitting: Family Medicine

## 2018-04-02 NOTE — Progress Notes (Signed)
PCP: [name], NP  Subjective:   HPI: Patient is a 57 y.o. female here for right knee, leg pain.  Patient reports she's been struggling with pain several weeks in her right knee anteriorly into posterior knee and calf. Pain is 8/10 and sharp. Associated swelling with this. No new injury. She stopped using calf sleeve which helped with anterior tibial pain. She is using heat as well. Worse with ambulation and after on feet for prolonged period. No skin changes, numbness.  Past Medical History:  Diagnosis Date  . Abnormal TSH   . Alcohol abuse   . Cocaine abuse ([place])    Remote crack cocaine use. UDS negative for cocaine 01/2012  . Coronary artery disease     inferior STEMI 01/13/12 s/p PTCA/DES to mid RCA. Initial EF 45% by cath but improved to 55-60% by echo 01/14/12  . Dyslipidemia    Trig 201, HDL 36, LDL 197 01/2012  . GERD (gastroesophageal reflux disease)   . History of MI (myocardial infarction) 01/2012  . Hypothyroidism   . Myocardial infarction ([place]) 2012  . Umbilical hernia     Current Outpatient Medications on File Prior to Visit  Medication Sig Dispense Refill  . aspirin EC 81 MG tablet Take 1 tablet (81 mg total) by mouth daily. 90 tablet 3  . loratadine (CLARITIN) 10 MG tablet Take 1 tablet (10 mg total) by mouth daily. (Patient taking differently: Take 10 mg by mouth daily as needed. ) 30 tablet 0  . mometasone (NASONEX) 50 MCG/ACT nasal spray Place 2 sprays into the nose daily. (Patient taking differently: Place 2 sprays into the nose daily as needed. ) 17 g 12  . nitroGLYCERIN (NITROSTAT) 0.4 MG SL tablet Place 1 tablet (0.4 mg total) under the tongue every 5 (five) minutes x 3 doses as needed for chest pain. 25 tablet 6   No current facility-administered medications on file prior to visit.     Past Surgical History:  Procedure Laterality Date  . ABDOMINAL HYSTERECTOMY  1981   partial  . CARDIAC CATHETERIZATION  2010, 2013   Negative. 2013--100%  blockage  . HERNIA REPAIR  2017   abd hernia   . LEFT HEART CATHETERIZATION WITH CORONARY ANGIOGRAM N/A 01/12/2012   Procedure: LEFT HEART CATHETERIZATION WITH CORONARY ANGIOGRAM;  Surgeon: Burnell Blanks, MD;  Location: [facility] CATH LAB;  Service: Cardiovascular;  Laterality: N/A;    Allergies  Allergen Reactions  . Ciprofloxacin     REACTION: nause-sweating-chest tightness  . Atorvastatin     Myalgia   . Crestor [Rosuvastatin Calcium] Nausea Only  . Livalo [Pitavastatin]     myalgia    Social History   Socioeconomic History  . Marital status: Single    Spouse name: Not on file  . Number of children: 1  . Years of education: Not on file  . Highest education level: Not on file  Occupational History  . Occupation: Best boy: [employer]  Social Needs  . Financial resource strain: Not on file  . Food insecurity:    Worry: Not on file    Inability: Not on file  . Transportation needs:    Medical: Not on file    Non-medical: Not on file  Tobacco Use  . Smoking status: Former Smoker    Years: 33.00    Types: Cigarettes    Last attempt to quit: 06/13/2008    Years since quitting: 9.8  . Smokeless tobacco: Never Used  . Tobacco comment:  None since 2009   Substance and Sexual Activity  . Alcohol use: No    Comment: former  . Drug use: No    Types: "Crack" cocaine    Comment: Crack cocaine; none since Jan 2006   . Sexual activity: Not on file  Lifestyle  . Physical activity:    Days per week: Not on file    Minutes per session: Not on file  . Stress: Not on file  Relationships  . Social connections:    Talks on phone: Not on file    Gets together: Not on file    Attends religious service: Not on file    Active member of club or organization: Not on file    Attends meetings of clubs or organizations: Not on file    Relationship status: Not on file  . Intimate partner violence:    Fear of current or ex partner: Not on file    Emotionally abused: Not on  file    Physically abused: Not on file    Forced sexual activity: Not on file  Other Topics Concern  . Not on file  Social History Narrative   Regular exercise:  Walks 5-7 x weekly   Caffeine use:  1 daily   Works at an independent living   Single   Daughter age 59 and 3 yr old grandson- daughter lives in [place].   Enjoys reading, sewing, Fill in puzzles, walking   Completed GED.   No pets.                Family History  Problem Relation Age of Onset  . Heart attack Mother 76       prior pacemaker  . Heart disease Mother   . Diabetes Mother   . Heart failure Father        CHF  . Heart disease Father   . Alcoholism Father   . Stroke Brother     BP 132/72   Pulse 71   Ht 5\' 6"  (1.676 m)   Wt 203 lb (92.1 kg)   BMI 32.77 kg/m   Review of Systems: See HPI above.     Objective:  Physical Exam:  Gen: NAD, comfortable in exam room  Right knee/lower leg: Minimal effusion.  No other gross deformity, ecchymoses, swelling. TTP lateral joint line and within peroneal muscles.  No medial joint line, other tenderness. FROM with 5/5 strength - pain on IR and ER of ankle. Negative ant/post drawers. Negative valgus/varus testing. Negative lachmanns. Positive mcmurrays, apleys, negative patellar apprehension. NV intact distally.  Left knee: No deformity. FROM with 5/5 strength. No tenderness to palpation. NVI distally.   Assessment & Plan:  1. Right knee/leg pain - concerning for lateral meniscus tear vs flare of underlying DJD.  Has secondary peroneal muscle strain/spasms as well.  Cortisone injection given today.  Discussed tylenol, aleve, supplements, topical medications.  Home exercises reviewed as well.  Heat or ice.  F/u in 1 month.  Sleeve for support/compression.  After informed written consent timeout was performed, patient was seated on exam table. Right knee was prepped with alcohol swab and utilizing anterolateral approach, patient's right knee was injected  intraarticularly with 3:1 bupivicaine: depomedrol. Patient tolerated the procedure well without immediate complications.

## 2018-04-02 NOTE — Assessment & Plan Note (Signed)
concerning for lateral meniscus tear vs flare of underlying DJD.  Has secondary peroneal muscle strain/spasms as well.  Cortisone injection given today.  Discussed tylenol, aleve, supplements, topical medications.  Home exercises reviewed as well.  Heat or ice.  F/u in 1 month.  Sleeve for support/compression.  After informed written consent timeout was performed, patient was seated on exam table. Right knee was prepped with alcohol swab and utilizing anterolateral approach, patient's right knee was injected intraarticularly with 3:1 bupivicaine: depomedrol. Patient tolerated the procedure well without immediate complications.

## 2018-04-18 ENCOUNTER — Ambulatory Visit (INDEPENDENT_AMBULATORY_CARE_PROVIDER_SITE_OTHER): Payer: BLUE CROSS/BLUE SHIELD | Admitting: Pharmacist

## 2018-04-18 DIAGNOSIS — E782 Mixed hyperlipidemia: Secondary | ICD-10-CM | POA: Diagnosis not present

## 2018-04-18 NOTE — Progress Notes (Signed)
Patient ID: Kaitlyn Bowman                 DOB: [birth year], 57 yo                         MRN: [id number]     HPI: Kaitlyn Bowman is a 57 y.o. female patient referred to lipid clinic by Kaitlyn Bowman. 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 Patient has intolerances to 3 statins and Zetia. She was seen by me in lipid clinic in 2016 and 2017. After intolerance to low dose Crestor 10mg  3x a week, she was approved for Repatha therapy in 2017, however refused to start therapy. She was still resistant to starting therapy when she saw Dr Angelena Bowman for follow up in May 2018. She did not want her cholesterol checked when she saw her PCP last month because she knew her cholesterol would be high. She called clinic 03/21/18 stating that she was willing to start injectable cholesterol medication. She presents today for follow up.  Pt presents today for lipid management. She saw her PCP recently to be tested for Lyme disease, but the test was canceled due to high amounts of fat in her blood. She remains hesitant about PCSK9i therapy. She has made improvements in her diet and has been eating more lean meat, fish, and vegetables. When I saw pt 2 years ago, she was eating fast food, fried food, and junk food daily. She walks constantly at work.  Current Medications: none Intolerances: Lipitor 80mg , Livalo 4mg , pravastatin 80mg , Zetia 10mg  - myalgias in legs, Crestor 10mg  3x a week - decreased appetite and muscle aches Risk Factors: ASCVD - prior MI with PCA, LDL > 170mg /dL LDL goal: 70mg /dL, non-HDL goal 100mg /dL  Diet: Has improved her diet in the past 3 months - eating more broccoli, beets, fish. Avoiding fried food.  Exercise: Walks during work on a regular basis.   Family History: Mother with MI at 82, heart disease, and DM. Father with CHF and heart disease, brother with stroke.  Social History: Pt has history of cocaine and alcohol abuse. Pt quit  smoking in 2009.  Labs: 09/03/17: TC 253, HDL 38, TG 207, LDL-D 162, nonHDL 215 (no therapy) 07/2015: TC 267, TG 214, HDL 36.3, LDL-D 172 (no therapy) 04/2014: TC 284, TG 185, HDL 37, LDL 210 (pt not sure, potentially on Zetia then per refill history)  Past Medical History:  Diagnosis Date  . Abnormal TSH   . Alcohol abuse   . Cocaine abuse ([place])    Remote crack cocaine use. UDS negative for cocaine 01/2012  . Coronary artery disease     inferior STEMI 01/13/12 s/p PTCA/DES to mid RCA. Initial EF 45% by cath but improved to 55-60% by echo 01/14/12  . Dyslipidemia    Trig 201, HDL 36, LDL 197 01/2012  . GERD (gastroesophageal reflux disease)   . History of MI (myocardial infarction) 01/2012  . Hypothyroidism   . Myocardial infarction ([place]) 2012  . Umbilical hernia     Current Outpatient Medications on File Prior to Visit  Medication Sig Dispense Refill  . aspirin EC 81 MG tablet Take 1 tablet (81 mg total) by mouth daily. 90 tablet 3  . loratadine (CLARITIN) 10 MG tablet Take 1 tablet (10 mg total) by mouth daily. (Patient taking differently: Take 10 mg by mouth daily as needed. ) 30  tablet 0  . mometasone (NASONEX) 50 MCG/ACT nasal spray Place 2 sprays into the nose daily. (Patient taking differently: Place 2 sprays into the nose daily as needed. ) 17 g 12  . nitroGLYCERIN (NITROSTAT) 0.4 MG SL tablet Place 1 tablet (0.4 mg total) under the tongue every 5 (five) minutes x 3 doses as needed for chest pain. 25 tablet 6   No current facility-administered medications on file prior to visit.     Allergies  Allergen Reactions  . Ciprofloxacin     REACTION: nause-sweating-chest tightness  . Atorvastatin     Myalgia   . Crestor [Rosuvastatin Calcium] Nausea Only  . Livalo [Pitavastatin]     myalgia    Assessment/Plan:  1. Hyperlipidemia - Patient with ASCVD and LDL above goal 70mg /dL. She is intolerant to Zetia and 3 statins including lowest starting dose of rosuvastatin. Again  discussed PCSK9i therapy. Pt remains hesitant however eventually seems agreeable to starting Praluent if her LDL today comes back > 70. Will call pt with results tomorrow and submit prior authorization for Praluent at that time.   Kaitlyn Bowman, PharmD [facility] [address] [place], [place] [address] Phone: [phone]; Fax: [phone] 04/18/2018 8:47 AM  Addendum: LDL extremely elevated at 174. Prior authorization for Praluent submitted and immediately approved. Rx sent to specialty pharmacy.

## 2018-04-18 NOTE — Patient Instructions (Signed)
It was nice to see you today  If your LDL comes back > 70, we will start Praluent injections for your cholesterol

## 2018-04-19 ENCOUNTER — Telehealth: Payer: Self-pay | Admitting: Pharmacist

## 2018-04-19 DIAGNOSIS — E782 Mixed hyperlipidemia: Secondary | ICD-10-CM

## 2018-04-19 LAB — LIPID PANEL
CHOL/HDL RATIO: 6.6 ratio — AB (ref 0.0–4.4)
Cholesterol, Total: 276 mg/dL — ABNORMAL HIGH (ref 100–199)
HDL: 42 mg/dL (ref >39)
TRIGLYCERIDES: 422 mg/dL — AB (ref 0–149)

## 2018-04-19 LAB — LDL CHOLESTEROL, DIRECT: LDL Direct: 174 mg/dL — ABNORMAL HIGH (ref 0–99)

## 2018-04-19 MED ORDER — ALIROCUMAB 150 MG/ML ~~LOC~~ SOPN: 1.0000 "pen " | PEN_INJECTOR | SUBCUTANEOUS | 11 refills | Status: DC

## 2018-04-19 NOTE — Telephone Encounter (Signed)
Praluent scheduled to be delivered tomorrow. Pt states she wants to try her first injection on her own. Advised her to contact clinic with any issues. Scheduled f/u lab work after 3 injections.

## 2018-04-22 ENCOUNTER — Other Ambulatory Visit (INDEPENDENT_AMBULATORY_CARE_PROVIDER_SITE_OTHER): Payer: BLUE CROSS/BLUE SHIELD

## 2018-04-22 DIAGNOSIS — E039 Hypothyroidism, unspecified: Secondary | ICD-10-CM | POA: Diagnosis not present

## 2018-04-23 LAB — TSH: TSH: 6.1 u[IU]/mL — ABNORMAL HIGH (ref 0.35–4.50)

## 2018-05-06 ENCOUNTER — Ambulatory Visit: Payer: BLUE CROSS/BLUE SHIELD | Admitting: Family Medicine

## 2018-05-07 ENCOUNTER — Ambulatory Visit (INDEPENDENT_AMBULATORY_CARE_PROVIDER_SITE_OTHER): Payer: BLUE CROSS/BLUE SHIELD | Admitting: Family Medicine

## 2018-05-07 ENCOUNTER — Encounter: Payer: Self-pay | Admitting: Family Medicine

## 2018-05-07 VITALS — BP 124/80 | HR 75 | Ht 66.0 in | Wt 200.0 lb

## 2018-05-07 DIAGNOSIS — M25561 Pain in right knee: Secondary | ICD-10-CM | POA: Diagnosis not present

## 2018-05-07 NOTE — Progress Notes (Signed)
PCP: [name], NP  Subjective:   HPI: Patient is a 57 y.o. female here for right knee, leg pain.  6/24: Patient reports she's been struggling with pain several weeks in her right knee anteriorly into posterior knee and calf. Pain is 8/10 and sharp. Associated swelling with this. No new injury. She stopped using calf sleeve which helped with anterior tibial pain. She is using heat as well. Worse with ambulation and after on feet for prolonged period. No skin changes, numbness.  7/30: Patient reports she's doing well. Pain currently 0/10 but does get some soreness with a lot of walking. She has not needed her brace. She is doing home exercises but not needing any medications. States she did great after the injection. No skin changes.  No new injuries.  Past Medical History:  Diagnosis Date  . Abnormal TSH   . Alcohol abuse   . Cocaine abuse ([place])    Remote crack cocaine use. UDS negative for cocaine 01/2012  . Coronary artery disease     inferior STEMI 01/13/12 s/p PTCA/DES to mid RCA. Initial EF 45% by cath but improved to 55-60% by echo 01/14/12  . Dyslipidemia    Trig 201, HDL 36, LDL 197 01/2012  . GERD (gastroesophageal reflux disease)   . History of MI (myocardial infarction) 01/2012  . Hypothyroidism   . Myocardial infarction ([place]) 2012  . Umbilical hernia     Current Outpatient Medications on File Prior to Visit  Medication Sig Dispense Refill  . Alirocumab (PRALUENT) 150 MG/ML SOPN Inject 1 pen into the skin every 14 (fourteen) days. 2 pen 11  . aspirin EC 81 MG tablet Take 1 tablet (81 mg total) by mouth daily. 90 tablet 3  . loratadine (CLARITIN) 10 MG tablet Take 1 tablet (10 mg total) by mouth daily. (Patient taking differently: Take 10 mg by mouth daily as needed. ) 30 tablet 0  . mometasone (NASONEX) 50 MCG/ACT nasal spray Place 2 sprays into the nose daily. (Patient taking differently: Place 2 sprays into the nose daily as needed. ) 17 g 12  .  nitroGLYCERIN (NITROSTAT) 0.4 MG SL tablet Place 1 tablet (0.4 mg total) under the tongue every 5 (five) minutes x 3 doses as needed for chest pain. 25 tablet 6   No current facility-administered medications on file prior to visit.     Past Surgical History:  Procedure Laterality Date  . ABDOMINAL HYSTERECTOMY  1981   partial  . CARDIAC CATHETERIZATION  2010, 2013   Negative. 2013--100% blockage  . HERNIA REPAIR  2017   abd hernia   . LEFT HEART CATHETERIZATION WITH CORONARY ANGIOGRAM N/A 01/12/2012   Procedure: LEFT HEART CATHETERIZATION WITH CORONARY ANGIOGRAM;  Surgeon: Burnell Blanks, MD;  Location: [facility] CATH LAB;  Service: Cardiovascular;  Laterality: N/A;    Allergies  Allergen Reactions  . Ciprofloxacin     REACTION: nause-sweating-chest tightness  . Atorvastatin     Myalgia   . Crestor [Rosuvastatin Calcium] Nausea Only  . Livalo [Pitavastatin]     myalgia    Social History   Socioeconomic History  . Marital status: Single    Spouse name: Not on file  . Number of children: 1  . Years of education: Not on file  . Highest education level: Not on file  Occupational History  . Occupation: Best boy: [employer]  Social Needs  . Financial resource strain: Not on file  . Food insecurity:    Worry: Not  on file    Inability: Not on file  . Transportation needs:    Medical: Not on file    Non-medical: Not on file  Tobacco Use  . Smoking status: Former Smoker    Years: 33.00    Types: Cigarettes    Last attempt to quit: 06/13/2008    Years since quitting: 9.9  . Smokeless tobacco: Never Used  . Tobacco comment: None since 2009   Substance and Sexual Activity  . Alcohol use: No    Comment: former  . Drug use: No    Types: "Crack" cocaine    Comment: Crack cocaine; none since Jan 2006   . Sexual activity: Not on file  Lifestyle  . Physical activity:    Days per week: Not on file    Minutes per session: Not on file  . Stress: Not on file   Relationships  . Social connections:    Talks on phone: Not on file    Gets together: Not on file    Attends religious service: Not on file    Active member of club or organization: Not on file    Attends meetings of clubs or organizations: Not on file    Relationship status: Not on file  . Intimate partner violence:    Fear of current or ex partner: Not on file    Emotionally abused: Not on file    Physically abused: Not on file    Forced sexual activity: Not on file  Other Topics Concern  . Not on file  Social History Narrative   Regular exercise:  Walks 5-7 x weekly   Caffeine use:  1 daily   Works at an independent living   Single   Daughter age 56 and 19 yr old grandson- daughter lives in [place].   Enjoys reading, sewing, Fill in puzzles, walking   Completed GED.   No pets.                Family History  Problem Relation Age of Onset  . Heart attack Mother 92       prior pacemaker  . Heart disease Mother   . Diabetes Mother   . Heart failure Father        CHF  . Heart disease Father   . Alcoholism Father   . Stroke Brother     BP 124/80   Pulse 75   Ht 5\' 6"  (1.676 m)   Wt 200 lb (90.7 kg)   BMI 32.28 kg/m   Review of Systems: See HPI above.     Objective:  Physical Exam:  Gen: NAD, comfortable in exam room  Right knee/lower leg: Minimal effusion.  No other gross deformity, ecchymoses. No TTP currently. FROM with 5/5 strength. Negative ant/post drawers. Negative valgus/varus testing. Negative lachmanns. Negative mcmurrays, apleys, patellar apprehension. NV intact distally.   Assessment & Plan:  1. Right knee/leg pain -much better following intra-articular injection.  Pain consistent with lateral meniscus tear versus flare of underlying DJD.  We discussed Tylenol, topical medications, supplements might help, Aleve.  Continue home exercises.  Heat or ice if needed.  Follow-up PRN.

## 2018-05-07 NOTE — Patient Instructions (Signed)
Your pain is due to a lateral meniscus tear, less likely a flare of arthritis. These are the different medications you can take for this: Tylenol 500mg  1-2 tabs three times a day for pain. Capsaicin, aspercreme, or biofreeze topically up to four times a day may also help with pain. Some supplements that may help for arthritis: Boswellia extract, curcumin, pycnogenol Aleve 1-2 tabs twice a day with food The cortisone shots can be repeated up to every 3 months at the earliest but the hope is this lasts you years. It's important that you continue to stay active. Straight leg raises, knee extensions 3 sets of 10 once a day (add ankle weight if these become too easy).  Ankle strengthening with yellow theraband for peroneal spasms also 3 sets of 10 once a day. Consider physical therapy to strengthen muscles around the joint that hurts to take pressure off of the joint itself. Shoe inserts with good arch support may be helpful. Heat or ice 15 minutes at a time 3-4 times a day as needed to help with pain. Continue wearing the sleeve. Follow up with me as needed.

## 2018-06-05 ENCOUNTER — Other Ambulatory Visit: Payer: BLUE CROSS/BLUE SHIELD | Admitting: *Deleted

## 2018-06-05 DIAGNOSIS — E782 Mixed hyperlipidemia: Secondary | ICD-10-CM

## 2018-06-05 LAB — LIPID PANEL
CHOL/HDL RATIO: 3.4 ratio (ref 0.0–4.4)
CHOLESTEROL TOTAL: 155 mg/dL (ref 100–199)
HDL: 45 mg/dL (ref >39)
LDL CALC: 78 mg/dL (ref 0–99)
TRIGLYCERIDES: 159 mg/dL — AB (ref 0–149)
VLDL Cholesterol Cal: 32 mg/dL (ref 5–40)

## 2018-06-05 LAB — HEPATIC FUNCTION PANEL
ALT: 26 IU/L (ref 0–32)
AST: 20 IU/L (ref 0–40)
Albumin: 4.2 g/dL (ref 3.5–5.5)
Alkaline Phosphatase: 76 IU/L (ref 39–117)
Bilirubin, Direct: 0.07 mg/dL (ref 0.00–0.40)
Total Protein: 7.2 g/dL (ref 6.0–8.5)

## 2018-06-06 ENCOUNTER — Other Ambulatory Visit: Payer: BLUE CROSS/BLUE SHIELD

## 2018-06-06 ENCOUNTER — Telehealth: Payer: Self-pay | Admitting: *Deleted

## 2018-06-06 NOTE — Telephone Encounter (Signed)
I spoke with pt and reviewed lab results with her. She reports she has been having chest tightness recently. Occurs when exerting herself at work. Improves with rest. She cannot come in for appointment this week.  I scheduled pt to see Dr. Angelena Form on 06/12/18 at 3:20.  I advised pt she should be evaluated in ED if symptoms worsen.

## 2018-06-08 ENCOUNTER — Emergency Department (HOSPITAL_BASED_OUTPATIENT_CLINIC_OR_DEPARTMENT_OTHER)
Admission: EM | Admit: 2018-06-08 | Discharge: 2018-06-08 | Disposition: A | Discharge status: Home / Self Care | Payer: BLUE CROSS/BLUE SHIELD | Attending: Emergency Medicine | Admitting: Emergency Medicine

## 2018-06-08 ENCOUNTER — Encounter (HOSPITAL_BASED_OUTPATIENT_CLINIC_OR_DEPARTMENT_OTHER): Payer: Self-pay | Admitting: *Deleted

## 2018-06-08 ENCOUNTER — Other Ambulatory Visit: Payer: Self-pay

## 2018-06-08 ENCOUNTER — Emergency Department (HOSPITAL_BASED_OUTPATIENT_CLINIC_OR_DEPARTMENT_OTHER): Payer: BLUE CROSS/BLUE SHIELD

## 2018-06-08 DIAGNOSIS — R0789 Other chest pain: Secondary | ICD-10-CM | POA: Diagnosis present

## 2018-06-08 DIAGNOSIS — I252 Old myocardial infarction: Secondary | ICD-10-CM | POA: Diagnosis not present

## 2018-06-08 DIAGNOSIS — Y93E5 Activity, floor mopping and cleaning: Secondary | ICD-10-CM | POA: Insufficient documentation

## 2018-06-08 DIAGNOSIS — X509XXA Other and unspecified overexertion or strenuous movements or postures, initial encounter: Secondary | ICD-10-CM | POA: Insufficient documentation

## 2018-06-08 DIAGNOSIS — S29011A Strain of muscle and tendon of front wall of thorax, initial encounter: Secondary | ICD-10-CM | POA: Insufficient documentation

## 2018-06-08 DIAGNOSIS — Z87891 Personal history of nicotine dependence: Secondary | ICD-10-CM | POA: Diagnosis not present

## 2018-06-08 DIAGNOSIS — Y999 Unspecified external cause status: Secondary | ICD-10-CM | POA: Insufficient documentation

## 2018-06-08 DIAGNOSIS — Z7982 Long term (current) use of aspirin: Secondary | ICD-10-CM | POA: Diagnosis not present

## 2018-06-08 DIAGNOSIS — Y929 Unspecified place or not applicable: Secondary | ICD-10-CM | POA: Insufficient documentation

## 2018-06-08 DIAGNOSIS — I2510 Atherosclerotic heart disease of native coronary artery without angina pectoris: Secondary | ICD-10-CM | POA: Insufficient documentation

## 2018-06-08 DIAGNOSIS — E039 Hypothyroidism, unspecified: Secondary | ICD-10-CM | POA: Insufficient documentation

## 2018-06-08 MED ORDER — CYCLOBENZAPRINE HCL 10 MG PO TABS: 10.0000 mg | ORAL_TABLET | Freq: Two times a day (BID) | ORAL | 0 refills | Status: DC | PRN

## 2018-06-08 MED ORDER — KETOROLAC TROMETHAMINE 60 MG/2ML IM SOLN
30.0000 mg | Freq: Once | INTRAMUSCULAR | Status: AC
  Administered 2018-06-08: 30 mg via INTRAMUSCULAR
  Filled 2018-06-08: qty 2

## 2018-06-08 MED ORDER — NAPROXEN 500 MG PO TABS: 500.0000 mg | ORAL_TABLET | Freq: Two times a day (BID) | ORAL | 0 refills | Status: DC

## 2018-06-08 NOTE — ED Notes (Signed)
ED Provider at bedside. 

## 2018-06-08 NOTE — ED Triage Notes (Signed)
Pt c/o left mid back pain x 3 days

## 2018-06-08 NOTE — ED Provider Notes (Signed)
[facility] EMERGENCY DEPARTMENT Provider Note   CSN: [id number] Arrival date & time: 06/08/18  1224     History   Chief Complaint Chief Complaint  Patient presents with  . Back Injury    HPI Kaitlyn Bowman is a 57 y.o. female.  HPI She presents to the emergency room for evaluation of right rib pain.  Patient states she was mopping the other day when she suddenly felt sharp pain in her right mid posterior back.  Patient tried taking Tylenol and rubbing a topical medication on the area.  Symptoms got a little better but this morning when she was sweeping she felt an acute strain and pull again in that same area.  Pain is sharp and increases with certain positions and movements.  She denies any shortness of breath.  No numbness or weakness.  No abdominal pain.  No fevers or chills. Past Medical History:  Diagnosis Date  . Abnormal TSH   . Alcohol abuse   . Cocaine abuse ([place])    Remote crack cocaine use. UDS negative for cocaine 01/2012  . Coronary artery disease     inferior STEMI 01/13/12 s/p PTCA/DES to mid RCA. Initial EF 45% by cath but improved to 55-60% by echo 01/14/12  . Dyslipidemia    Trig 201, HDL 36, LDL 197 01/2012  . GERD (gastroesophageal reflux disease)   . History of MI (myocardial infarction) 01/2012  . Hypothyroidism   . Myocardial infarction ([place]) 2012  . Umbilical hernia     Patient Active Problem List   Diagnosis Date Noted  . Right knee pain 02/22/2017  . Back pain 10/06/2014  . Wrist pain, right 09/30/2014  . Acute bacterial bronchitis 09/01/2014  . Grief reaction 07/13/2014  . Plantar fasciitis 05/11/2014  . Left leg pain 05/11/2014  . Menopausal syndrome 05/19/2013  . Umbilical hernia 99/83/3825  . Special screening for malignant neoplasms, colon 04/15/2012  . General medical examination 02/21/2012  . CAD (coronary artery disease) 01/30/2012  . Seasonal allergies 01/24/2012  . Inferior MI-status post stenting 4/13 01/14/2012  .  Hyperlipidemia 01/14/2012  . Hypothyroidism 01/14/2012  . SUBSTANCE ABUSE 07/06/2010    Past Surgical History:  Procedure Laterality Date  . ABDOMINAL HYSTERECTOMY  1981   partial  . CARDIAC CATHETERIZATION  2010, 2013   Negative. 2013--100% blockage  . HERNIA REPAIR  2017   abd hernia   . LEFT HEART CATHETERIZATION WITH CORONARY ANGIOGRAM N/A 01/12/2012   Procedure: LEFT HEART CATHETERIZATION WITH CORONARY ANGIOGRAM;  Surgeon: Burnell Blanks, MD;  Location: [facility] CATH LAB;  Service: Cardiovascular;  Laterality: N/A;     OB History   None      Home Medications    Prior to Admission medications   Medication Sig Start Date End Date Taking? Authorizing Provider  Alirocumab (PRALUENT) 150 MG/ML SOPN Inject 1 pen into the skin every 14 (fourteen) days. 04/19/18   Burnell Blanks, MD  aspirin EC 81 MG tablet Take 1 tablet (81 mg total) by mouth daily. 06/16/13   Burnell Blanks, MD  cyclobenzaprine (FLEXERIL) 10 MG tablet Take 1 tablet (10 mg total) by mouth 2 (two) times daily as needed for muscle spasms. 06/08/18   Dorie Rank, MD  loratadine (CLARITIN) 10 MG tablet Take 1 tablet (10 mg total) by mouth daily. Patient taking differently: Take 10 mg by mouth daily as needed.  03/27/16   Julianne Rice, MD  mometasone (NASONEX) 50 MCG/ACT nasal spray Place 2 sprays into the  nose daily. Patient taking differently: Place 2 sprays into the nose daily as needed.  03/27/16   Julianne Rice, MD  naproxen (NAPROSYN) 500 MG tablet Take 1 tablet (500 mg total) by mouth 2 (two) times daily with a meal. As needed for pain 06/08/18   Dorie Rank, MD  nitroGLYCERIN (NITROSTAT) 0.4 MG SL tablet Place 1 tablet (0.4 mg total) under the tongue every 5 (five) minutes x 3 doses as needed for chest pain. 01/18/15   Burnell Blanks, MD    Family History Family History  Problem Relation Age of Onset  . Heart attack Mother 71       prior pacemaker  . Heart disease Mother   .  Diabetes Mother   . Heart failure Father        CHF  . Heart disease Father   . Alcoholism Father   . Stroke Brother     Social History Social History   Tobacco Use  . Smoking status: Former Smoker    Years: 33.00    Types: Cigarettes    Last attempt to quit: 06/13/2008    Years since quitting: 9.9  . Smokeless tobacco: Never Used  . Tobacco comment: None since 2009   Substance Use Topics  . Alcohol use: No    Comment: former  . Drug use: No    Types: "Crack" cocaine    Comment: Crack cocaine; none since Jan 2006      Allergies   Ciprofloxacin; Atorvastatin; Crestor [rosuvastatin calcium]; and Livalo [pitavastatin]   Review of Systems Review of Systems  All other systems reviewed and are negative.    Physical Exam Updated Vital Signs BP (!) 148/83   Pulse 73   Temp 98.4 F (36.9 C)   Resp 16   Ht 1.676 m (5\' 6" )   Wt 91.6 kg   SpO2 97%   BMI 32.60 kg/m   Physical Exam  Constitutional: She appears well-developed and well-nourished. No distress.  HENT:  Head: Normocephalic and atraumatic.  Right Ear: External ear normal.  Left Ear: External ear normal.  Eyes: Conjunctivae are normal. Right eye exhibits no discharge. Left eye exhibits no discharge. No scleral icterus.  Neck: Neck supple. No tracheal deviation present.  Cardiovascular: Normal rate, regular rhythm and intact distal pulses.  Pulmonary/Chest: Effort normal and breath sounds normal. No stridor. No respiratory distress. She has no wheezes. She has no rales.      ttp right posterior lower rib margin, no crepitus or deformity  Abdominal: Soft. Bowel sounds are normal. She exhibits no distension. There is no tenderness. There is no rebound and no guarding.  Musculoskeletal: She exhibits no edema or tenderness.  Neurological: She is alert. She has normal strength. No cranial nerve deficit (no facial droop, extraocular movements intact, no slurred speech) or sensory deficit. She exhibits normal  muscle tone. She displays no seizure activity. Coordination normal.  Skin: Skin is warm and dry. No rash noted.  Psychiatric: She has a normal mood and affect.  Nursing note and vitals reviewed.    ED Treatments / Results  Labs (all labs ordered are listed, but only abnormal results are displayed) Labs Reviewed - No data to display   Radiology Dg Ribs Unilateral W/chest Right  Result Date: 06/08/2018 CLINICAL DATA:  57 year old female with right-sided rib pain, no known injury EXAM: RIGHT RIBS AND CHEST - 3+ VIEW COMPARISON:  Prior chest x-ray 08/17/2016 FINDINGS: The lungs are clear and negative for focal airspace consolidation, pulmonary edema  or suspicious pulmonary nodule. No pleural effusion or pneumothorax. Cardiac and mediastinal contours are within normal limits. No acute fracture or lytic or blastic osseous lesions. The visualized upper abdominal bowel gas pattern is unremarkable. IMPRESSION: No evidence of acute rib injury or lesion. Electronically Signed   By: Jacqulynn Cadet M.D.   On: 06/08/2018 13:40    Procedures Procedures (including critical care time)  Medications Ordered in ED Medications  ketorolac (TORADOL) injection 30 mg (30 mg Intramuscular Given 06/08/18 1315)     Initial Impression / Assessment and Plan / ED Course  I have reviewed the triage vital signs and the nursing notes.  Pertinent labs & imaging results that were available during my care of the patient were reviewed by me and considered in my medical decision making (see chart for details).  Clinical Course as of Jun 08 1408  Sat Jun 08, 2018  1407 The patient's symptoms have improved with treatment   [JK]    Clinical Course User Index [JK] Dorie Rank, MD  Patient presented to the emergency room with intercostal muscle pain.  This was triggered after mopping at work.  Patient is breathing easily.  She has no abdominal tenderness.  She does have discrete tenderness in the intercostal region.   Chest x-ray does not show any aunts of rib fracture or other abnormality.  Plan on discharge home with NSAIDs and muscle relaxants.   Final Clinical Impressions(s) / ED Diagnoses   Final diagnoses:  Intercostal muscle strain, initial encounter    ED Discharge Orders         Ordered    naproxen (NAPROSYN) 500 MG tablet  2 times daily with meals     06/08/18 1405    cyclobenzaprine (FLEXERIL) 10 MG tablet  2 times daily PRN     06/08/18 1405           Dorie Rank, MD 06/08/18 1409

## 2018-06-08 NOTE — Discharge Instructions (Addendum)
Take the medication as needed for pain, avoid any strenuous activity, follow-up with your doctor next week if symptoms have not resolved

## 2018-06-12 ENCOUNTER — Encounter: Payer: Self-pay | Admitting: Cardiovascular Disease

## 2018-06-12 ENCOUNTER — Ambulatory Visit (INDEPENDENT_AMBULATORY_CARE_PROVIDER_SITE_OTHER): Payer: BLUE CROSS/BLUE SHIELD | Admitting: Cardiovascular Disease

## 2018-06-12 VITALS — BP 136/74 | HR 94 | Ht 66.0 in | Wt 205.8 lb

## 2018-06-12 DIAGNOSIS — E7800 Pure hypercholesterolemia, unspecified: Secondary | ICD-10-CM | POA: Diagnosis not present

## 2018-06-12 DIAGNOSIS — I2510 Atherosclerotic heart disease of native coronary artery without angina pectoris: Secondary | ICD-10-CM

## 2018-06-12 NOTE — Patient Instructions (Signed)

## 2018-06-12 NOTE — Progress Notes (Signed)
Chief Complaint  Patient presents with  . Follow-up    CAD    History of Present Illness: 57 yo female with history of CAD and hyperlipidemia here today for cardiac follow up. She had an inferior STEMI in April 2013 and was found to have an occluded mid RCA which was treated with a drug eluting stent. Most recent echo May 2016 with normal LV function. Stress test in 2016 with no ischemia.  She was at work last week and was mopping and she twisted her back. Her back began to ache. This did not resolve with rest so she was seen in the ED 06/08/18 and this was felt to be musculoskeletal related.   She is here today for follow up. The patient denies any chest pain, dyspnea, palpitations, lower extremity edema, orthopnea, PND, dizziness, near syncope or syncope.   Primary Care Provider: [name], NP   Past Medical History:  Diagnosis Date  . Abnormal TSH   . Alcohol abuse   . Cocaine abuse ([place])    Remote crack cocaine use. UDS negative for cocaine 01/2012  . Coronary artery disease     inferior STEMI 01/13/12 s/p PTCA/DES to mid RCA. Initial EF 45% by cath but improved to 55-60% by echo 01/14/12  . Dyslipidemia    Trig 201, HDL 36, LDL 197 01/2012  . GERD (gastroesophageal reflux disease)   . History of MI (myocardial infarction) 01/2012  . Hypothyroidism   . Myocardial infarction ([place]) 2012  . Umbilical hernia     Past Surgical History:  Procedure Laterality Date  . ABDOMINAL HYSTERECTOMY  1981   partial  . CARDIAC CATHETERIZATION  2010, 2013   Negative. 2013--100% blockage  . HERNIA REPAIR  2017   abd hernia   . LEFT HEART CATHETERIZATION WITH CORONARY ANGIOGRAM N/A 01/12/2012   Procedure: LEFT HEART CATHETERIZATION WITH CORONARY ANGIOGRAM;  Surgeon: Burnell Blanks, MD;  Location: [facility] CATH LAB;  Service: Cardiovascular;  Laterality: N/A;    Current Outpatient Medications  Medication Sig Dispense Refill  . Alirocumab (PRALUENT) 150 MG/ML SOPN Inject 1 pen into the  skin every 14 (fourteen) days. 2 pen 11  . aspirin EC 81 MG tablet Take 1 tablet (81 mg total) by mouth daily. 90 tablet 3  . cyclobenzaprine (FLEXERIL) 10 MG tablet Take 1 tablet (10 mg total) by mouth 2 (two) times daily as needed for muscle spasms. 20 tablet 0  . loratadine (CLARITIN) 10 MG tablet Take 1 tablet (10 mg total) by mouth daily. (Patient taking differently: Take 10 mg by mouth daily as needed. ) 30 tablet 0  . mometasone (NASONEX) 50 MCG/ACT nasal spray Place 2 sprays into the nose daily. (Patient taking differently: Place 2 sprays into the nose daily as needed. ) 17 g 12  . naproxen (NAPROSYN) 500 MG tablet Take 1 tablet (500 mg total) by mouth 2 (two) times daily with a meal. As needed for pain 20 tablet 0  . nitroGLYCERIN (NITROSTAT) 0.4 MG SL tablet Place 1 tablet (0.4 mg total) under the tongue every 5 (five) minutes x 3 doses as needed for chest pain. 25 tablet 6   No current facility-administered medications for this visit.     Allergies  Allergen Reactions  . Ciprofloxacin     REACTION: nause-sweating-chest tightness  . Atorvastatin     Myalgia   . Crestor [Rosuvastatin Calcium] Nausea Only  . Livalo [Pitavastatin]     myalgia    Social History   Socioeconomic  History  . Marital status: Single    Spouse name: Not on file  . Number of children: 1  . Years of education: Not on file  . Highest education level: Not on file  Occupational History  . Occupation: Best boy: [employer]  Social Needs  . Financial resource strain: Not on file  . Food insecurity:    Worry: Not on file    Inability: Not on file  . Transportation needs:    Medical: Not on file    Non-medical: Not on file  Tobacco Use  . Smoking status: Former Smoker    Years: 33.00    Types: Cigarettes    Last attempt to quit: 06/13/2008    Years since quitting: 10.0  . Smokeless tobacco: Never Used  . Tobacco comment: None since 2009   Substance and Sexual Activity  . Alcohol use: No      Comment: former  . Drug use: No    Types: "Crack" cocaine    Comment: Crack cocaine; none since Jan 2006   . Sexual activity: Not on file  Lifestyle  . Physical activity:    Days per week: Not on file    Minutes per session: Not on file  . Stress: Not on file  Relationships  . Social connections:    Talks on phone: Not on file    Gets together: Not on file    Attends religious service: Not on file    Active member of club or organization: Not on file    Attends meetings of clubs or organizations: Not on file    Relationship status: Not on file  . Intimate partner violence:    Fear of current or ex partner: Not on file    Emotionally abused: Not on file    Physically abused: Not on file    Forced sexual activity: Not on file  Other Topics Concern  . Not on file  Social History Narrative   Regular exercise:  Walks 5-7 x weekly   Caffeine use:  1 daily   Works at an independent living   Single   Daughter age 63 and 62 yr old grandson- daughter lives in [place].   Enjoys reading, sewing, Fill in puzzles, walking   Completed GED.   No pets.                Family History  Problem Relation Age of Onset  . Heart attack Mother 23       prior pacemaker  . Heart disease Mother   . Diabetes Mother   . Heart failure Father        CHF  . Heart disease Father   . Alcoholism Father   . Stroke Brother     Review of Systems:  As stated in the HPI and otherwise negative.   BP 136/74   Pulse 94   Ht 5\' 6"  (1.676 m)   Wt 205 lb 12.8 oz (93.4 kg)   SpO2 98%   BMI 33.22 kg/m   Physical Examination:  General: Well developed, well nourished, NAD  HEENT: OP clear, mucus membranes moist  SKIN: warm, dry. No rashes. Neuro: No focal deficits  Musculoskeletal: Muscle strength 5/5 all ext  Psychiatric: Mood and affect normal  Neck: No JVD, no carotid bruits, no thyromegaly, no lymphadenopathy.  Lungs:Clear bilaterally, no wheezes, rhonci, crackles Cardiovascular: Regular  rate and rhythm. No murmurs, gallops or rubs. Abdomen:Soft. Bowel sounds present. Non-tender.  Extremities: No lower  extremity edema. Pulses are 2 + in the bilateral DP/PT.  Echo 02/15/15: - Left ventricle: The cavity size was normal. Systolic function was normal. The estimated ejection fraction was in the range of 55% to 60%. Wall motion was normal; there were no regional wall motion abnormalities. Left ventricular diastolic function parameters were normal. - Aortic valve: There was trivial regurgitation. - Mitral valve: There was mild to moderate regurgitation directed centrally. - Left atrium: The atrium was mildly dilated.   EKG:  EKG is ordered today. The ekg ordered today demonstrates NSR, rate 94 bpm. Non-specific T wave abn  Recent Labs: 03/18/2018: BUN 22; Creatinine, Ser 1.02; Hemoglobin 12.5; Platelets 357.0; Potassium 4.2; Sodium 140 04/22/2018: TSH 6.10 06/05/2018: ALT 26   Lipid Panel    Component Value Date/Time   CHOL 155 06/05/2018 0726   TRIG 159 (H) 06/05/2018 0726   HDL 45 06/05/2018 0726   CHOLHDL 3.4 06/05/2018 0726   CHOLHDL 7 09/03/2017 1503   VLDL 41.4 (H) 09/03/2017 1503   LDLCALC 78 06/05/2018 0726   LDLDIRECT 174 (H) 04/18/2018 1645   LDLDIRECT 162.0 09/03/2017 1503     Wt Readings from Last 3 Encounters:  06/12/18 205 lb 12.8 oz (93.4 kg)  06/08/18 202 lb (91.6 kg)  05/07/18 200 lb (90.7 kg)     Other studies Reviewed: Additional studies/ records that were reviewed today include:  Review of the above records demonstrates:   Cardiac cath 01/12/12: Left main: No obstructive disease.  Left Anterior Descending Artery: Large vessel that courses to the apex. Mild luminal irregularities in the mid vessel. Moderate sized diagonal branch with no disease.  Circumflex Artery: Large caliber vessel with serial 20% lesions in the mid vessel. OM1 is moderate sized with no disease. The second and third marginals are small. The fourth OM branch is  moderate sized and has a 30% proximal stenosis.  Right Coronary Artery: Dominant vessel with 100% mid occlusion.  Left Ventricular Angiogram: LVEF 45%. Inferior wall hypokinesis.    Assessment and Plan:   1. CAD without angina: No chest pain. She does not tolerate statins due to muscle aches or beta blockers due to fatigue. Exercise stress test May 2016 with no ischemia. Normal LV function by echo 2016. I will continue ASA  2. HLD: She is now on Praluent. She does not tolerate statins. She is being followed in the lipid clinic. LDL much improved on Praluent.     Current medicines are reviewed at length with the patient today.  The patient does not have concerns regarding medicines.  The following changes have been made:  no change  Labs/ tests ordered today include:  Orders Placed This Encounter  Procedures  . EKG 12-Lead    Disposition:   FU with me in 12 months  Signed, Lauree Chandler, MD 06/12/2018 4:50 PM    [facility] [address], [place], [place]  [address] Phone: [phone]; Fax: [phone]

## 2018-07-19 ENCOUNTER — Ambulatory Visit (INDEPENDENT_AMBULATORY_CARE_PROVIDER_SITE_OTHER): Payer: BLUE CROSS/BLUE SHIELD | Admitting: Family

## 2018-07-19 ENCOUNTER — Encounter: Payer: Self-pay | Admitting: Family

## 2018-07-19 VITALS — BP 122/56 | HR 77 | Temp 98.2°F | Resp 16 | Ht 66.0 in | Wt 205.0 lb

## 2018-07-19 DIAGNOSIS — E785 Hyperlipidemia, unspecified: Secondary | ICD-10-CM

## 2018-07-19 DIAGNOSIS — E039 Hypothyroidism, unspecified: Secondary | ICD-10-CM | POA: Diagnosis not present

## 2018-07-19 DIAGNOSIS — R739 Hyperglycemia, unspecified: Secondary | ICD-10-CM | POA: Diagnosis not present

## 2018-07-19 DIAGNOSIS — L309 Dermatitis, unspecified: Secondary | ICD-10-CM

## 2018-07-19 MED ORDER — BETAMETHASONE VALERATE 0.1 % EX OINT: 1.0000 "application " | TOPICAL_OINTMENT | Freq: Two times a day (BID) | CUTANEOUS | 0 refills | Status: DC

## 2018-07-19 NOTE — Patient Instructions (Addendum)
Please complete lab work prior to leaving.   

## 2018-07-19 NOTE — Progress Notes (Signed)
Subjective:    Patient ID: Kaitlyn Bowman, female    DOB: 12-Dec-1960, 57 y.o.   MRN: [id number]  HPI  Patient is a 57 yr old female who presents today with chief complaint of insect bites.  Feels like there are a lot of nats in her home and that they might be biting her. She reports that she has been applying hydrocortisone to the bites because they itch.   Hyperlipidemia- now on praluent.  Lab Results  Component Value Date   CHOL 155 06/05/2018   HDL 45 06/05/2018   LDLCALC 78 06/05/2018   LDLDIRECT 174 (H) 04/18/2018   TRIG 159 (H) 06/05/2018   CHOLHDL 3.4 06/05/2018    Review of Systems See HPI  Past Medical History:  Diagnosis Date  . Abnormal TSH   . Alcohol abuse   . Cocaine abuse ([place])    Remote crack cocaine use. UDS negative for cocaine 01/2012  . Coronary artery disease     inferior STEMI 01/13/12 s/p PTCA/DES to mid RCA. Initial EF 45% by cath but improved to 55-60% by echo 01/14/12  . Dyslipidemia    Trig 201, HDL 36, LDL 197 01/2012  . GERD (gastroesophageal reflux disease)   . History of MI (myocardial infarction) 01/2012  . Hypothyroidism   . Myocardial infarction ([place]) 2012  . Umbilical hernia      Social History   Socioeconomic History  . Marital status: Single    Spouse name: Not on file  . Number of children: 1  . Years of education: Not on file  . Highest education level: Not on file  Occupational History  . Occupation: Best boy: [employer]  Social Needs  . Financial resource strain: Not on file  . Food insecurity:    Worry: Not on file    Inability: Not on file  . Transportation needs:    Medical: Not on file    Non-medical: Not on file  Tobacco Use  . Smoking status: Former Smoker    Years: 33.00    Types: Cigarettes    Last attempt to quit: 06/13/2008    Years since quitting: 10.1  . Smokeless tobacco: Never Used  . Tobacco comment: None since 2009   Substance and Sexual Activity  . Alcohol use: No    Comment: former  . Drug  use: No    Types: "Crack" cocaine    Comment: Crack cocaine; none since Jan 2006   . Sexual activity: Not on file  Lifestyle  . Physical activity:    Days per week: Not on file    Minutes per session: Not on file  . Stress: Not on file  Relationships  . Social connections:    Talks on phone: Not on file    Gets together: Not on file    Attends religious service: Not on file    Active member of club or organization: Not on file    Attends meetings of clubs or organizations: Not on file    Relationship status: Not on file  . Intimate partner violence:    Fear of current or ex partner: Not on file    Emotionally abused: Not on file    Physically abused: Not on file    Forced sexual activity: Not on file  Other Topics Concern  . Not on file  Social History Narrative   Regular exercise:  Walks 5-7 x weekly   Caffeine use:  1 daily   Works at  an independent living   Single   Daughter age 4 and 63 yr old grandson- daughter lives in [place].   Enjoys reading, sewing, Fill in puzzles, walking   Completed GED.   No pets.                Past Surgical History:  Procedure Laterality Date  . ABDOMINAL HYSTERECTOMY  1981   partial  . CARDIAC CATHETERIZATION  2010, 2013   Negative. 2013--100% blockage  . HERNIA REPAIR  2017   abd hernia   . LEFT HEART CATHETERIZATION WITH CORONARY ANGIOGRAM N/A 01/12/2012   Procedure: LEFT HEART CATHETERIZATION WITH CORONARY ANGIOGRAM;  Surgeon: Burnell Blanks, MD;  Location: [facility] CATH LAB;  Service: Cardiovascular;  Laterality: N/A;    Family History  Problem Relation Age of Onset  . Heart attack Mother 18       prior pacemaker  . Heart disease Mother   . Diabetes Mother   . Heart failure Father        CHF  . Heart disease Father   . Alcoholism Father   . Stroke Brother     Allergies  Allergen Reactions  . Ciprofloxacin     REACTION: nause-sweating-chest tightness  . Atorvastatin     Myalgia   . Crestor [Rosuvastatin  Calcium] Nausea Only  . Livalo [Pitavastatin]     myalgia    Current Outpatient Medications on File Prior to Visit  Medication Sig Dispense Refill  . Alirocumab (PRALUENT) 150 MG/ML SOPN Inject 1 pen into the skin every 14 (fourteen) days. 2 pen 11  . aspirin EC 81 MG tablet Take 1 tablet (81 mg total) by mouth daily. 90 tablet 3  . cyclobenzaprine (FLEXERIL) 10 MG tablet Take 1 tablet (10 mg total) by mouth 2 (two) times daily as needed for muscle spasms. 20 tablet 0  . loratadine (CLARITIN) 10 MG tablet Take 1 tablet (10 mg total) by mouth daily. (Patient taking differently: Take 10 mg by mouth daily as needed. ) 30 tablet 0  . mometasone (NASONEX) 50 MCG/ACT nasal spray Place 2 sprays into the nose daily. (Patient taking differently: Place 2 sprays into the nose daily as needed. ) 17 g 12  . naproxen (NAPROSYN) 500 MG tablet Take 1 tablet (500 mg total) by mouth 2 (two) times daily with a meal. As needed for pain 20 tablet 0  . nitroGLYCERIN (NITROSTAT) 0.4 MG SL tablet Place 1 tablet (0.4 mg total) under the tongue every 5 (five) minutes x 3 doses as needed for chest pain. 25 tablet 6   No current facility-administered medications on file prior to visit.     BP (!) 122/56 (BP Location: Right Arm, Patient Position: Sitting, Cuff Size: Small)   Pulse 77   Temp 98.2 F (36.8 C) (Oral)   Resp 16   Ht 5\' 6"  (1.676 m)   Wt 205 lb (93 kg)   SpO2 98%   BMI 33.09 kg/m       Objective:   Physical Exam  Constitutional: She is oriented to person, place, and time. She appears well-developed and well-nourished.  Cardiovascular: Normal rate, regular rhythm and normal heart sounds.  No murmur heard. Pulmonary/Chest: Effort normal and breath sounds normal. No respiratory distress. She has no wheezes.  Neurological: She is alert and oriented to person, place, and time.  Skin: Skin is warm.  Mild dermatitis noted on right shin. Several small raised lesions notd on bilateral forearms.     Psychiatric: She has  a normal mood and affect. Her behavior is normal. Judgment and thought content normal.          Assessment & Plan:  Hyperlipidemia- she is now on praluent which is being managed by the lipid clinic.  She is requesting a follow up lipid panel.  Hyperglycemia- will check follow up a1c. She declines flu shot today.  Lab Results  Component Value Date   HGBA1C 6.1 03/18/2018   Hypothyroid- not on synthroid. Repeat TSH. If still elevated, consider synthroid.  Lab Results  Component Value Date   TSH 6.10 (H) 04/22/2018   Skin rash- ? Eczema versus insect bites. Advised trial of betamethasone cream.

## 2018-07-20 LAB — BASIC METABOLIC PANEL
BUN: 16 mg/dL (ref 7–25)
CALCIUM: 9.7 mg/dL (ref 8.6–10.4)
CHLORIDE: 108 mmol/L (ref 98–110)
CO2: 24 mmol/L (ref 20–32)
CREATININE: 0.87 mg/dL (ref 0.50–1.05)
Glucose, Bld: 122 mg/dL — ABNORMAL HIGH (ref 65–99)
POTASSIUM: 3.8 mmol/L (ref 3.5–5.3)
SODIUM: 140 mmol/L (ref 135–146)

## 2018-07-20 LAB — LIPID PANEL
CHOL/HDL RATIO: 4.4 (calc) (ref <5.0)
CHOLESTEROL: 169 mg/dL (ref <200)
HDL: 38 mg/dL — AB (ref >50)
Non-HDL Cholesterol (Calc): 131 mg/dL (calc) — ABNORMAL HIGH (ref <130)
Triglycerides: 477 mg/dL — ABNORMAL HIGH (ref <150)

## 2018-07-20 LAB — HEMOGLOBIN A1C
EAG (MMOL/L): 6.8 (calc)
Hgb A1c MFr Bld: 5.9 % of total Hgb — ABNORMAL HIGH (ref <5.7)
MEAN PLASMA GLUCOSE: 123 (calc)

## 2018-07-20 LAB — TSH: TSH: 3.05 m[IU]/L (ref 0.40–4.50)

## 2018-07-23 ENCOUNTER — Telehealth: Payer: Self-pay | Admitting: Family

## 2018-07-23 NOTE — Telephone Encounter (Signed)
Pt given lab results per notes of Elvera Maria on 07/22/18. Pt verbalized understanding.

## 2018-07-23 NOTE — Telephone Encounter (Signed)
Unable to chart in result note encounter not created

## 2018-07-23 NOTE — Telephone Encounter (Signed)
Pt states she spoke with the nurse that does her Prolia injections and they stated they believe why her lab results were high was due to pt had ate a full course meal and banana pudding before her appt. They suggest pt come back in for fasting labs. Please advise.

## 2018-07-24 ENCOUNTER — Ambulatory Visit: Payer: BLUE CROSS/BLUE SHIELD | Admitting: Cardiovascular Disease

## 2018-07-25 NOTE — Telephone Encounter (Signed)
Please contact pt to return for fasting lipid panel, dx hypertriglyceridemia.

## 2018-07-26 ENCOUNTER — Other Ambulatory Visit: Payer: Self-pay

## 2018-07-26 ENCOUNTER — Telehealth: Payer: Self-pay

## 2018-07-26 DIAGNOSIS — R739 Hyperglycemia, unspecified: Secondary | ICD-10-CM

## 2018-07-26 NOTE — Telephone Encounter (Signed)
Phone note created 

## 2018-07-26 NOTE — Telephone Encounter (Signed)
Lm for patient to call back for appt. Scheduling of repeat labs.  Mora for triage to release information and schedule appointment, order entered as future.   [name] O'sullivan note Please contact pt to return for fasting lipid panel, dx hypertriglyceridemia.

## 2018-07-26 NOTE — Telephone Encounter (Signed)
Lm for patient to call back for appt. Scheduling of repeat labs.  Anchor for triage to release information and schedule appointment, order entered as future. Phone note created.

## 2018-07-29 ENCOUNTER — Other Ambulatory Visit (INDEPENDENT_AMBULATORY_CARE_PROVIDER_SITE_OTHER): Payer: BLUE CROSS/BLUE SHIELD

## 2018-07-29 DIAGNOSIS — R739 Hyperglycemia, unspecified: Secondary | ICD-10-CM

## 2018-07-29 LAB — LIPID PANEL
Cholesterol: 143 mg/dL (ref 0–200)
HDL: 36.7 mg/dL — ABNORMAL LOW (ref >39.00)
NONHDL: 106.26
Total CHOL/HDL Ratio: 4
Triglycerides: 246 mg/dL — ABNORMAL HIGH (ref 0.0–149.0)
VLDL: 49.2 mg/dL — ABNORMAL HIGH (ref 0.0–40.0)

## 2018-07-29 LAB — LDL CHOLESTEROL, DIRECT: Direct LDL: 63 mg/dL

## 2018-09-16 ENCOUNTER — Encounter: Payer: Self-pay | Admitting: Nurse Practitioner

## 2018-09-16 ENCOUNTER — Ambulatory Visit (INDEPENDENT_AMBULATORY_CARE_PROVIDER_SITE_OTHER): Payer: BLUE CROSS/BLUE SHIELD | Admitting: Nurse Practitioner

## 2018-09-16 VITALS — BP 132/74 | HR 85 | Temp 99.5°F | Ht 66.0 in | Wt 208.0 lb

## 2018-09-16 DIAGNOSIS — R6889 Other general symptoms and signs: Secondary | ICD-10-CM | POA: Diagnosis not present

## 2018-09-16 MED ORDER — HYDROCODONE-HOMATROPINE 5-1.5 MG/5ML PO SYRP: 5.0000 mL | ORAL_SOLUTION | Freq: Two times a day (BID) | ORAL | 0 refills | Status: DC | PRN

## 2018-09-16 MED ORDER — OSELTAMIVIR PHOSPHATE 75 MG PO CAPS: 75.0000 mg | ORAL_CAPSULE | Freq: Two times a day (BID) | ORAL | 0 refills | Status: DC

## 2018-09-16 NOTE — Progress Notes (Signed)
Subjective:  Patient ID: Kaitlyn Bowman, female    DOB: 1961-06-03  Age: 57 y.o. MRN: [id number]  CC: Nasal Congestion (pt is complaining of chills,bodyache,coughing yellow mucus,eyes and head pain,coughing/going on 2 days/grandson and cio work is sick. )  URI   This is a new problem. The current episode started yesterday. The problem has been unchanged. The maximum temperature recorded prior to her arrival was 100.4 - 100.9 F. Associated symptoms include congestion, coughing, ear pain, headaches, joint pain, a plugged ear sensation, rhinorrhea, sinus pain, sneezing, a sore throat and swollen glands. Pertinent negatives include no joint swelling, nausea, rash, vomiting or wheezing. She has tried acetaminophen for the symptoms. The treatment provided no relief.   Reviewed past Medical, Social and Family history today.  Outpatient Medications Prior to Visit  Medication Sig Dispense Refill  . Alirocumab (PRALUENT) 150 MG/ML SOPN Inject 1 pen into the skin every 14 (fourteen) days. 2 pen 11  . aspirin EC 81 MG tablet Take 1 tablet (81 mg total) by mouth daily. 90 tablet 3  . betamethasone valerate ointment (VALISONE) 0.1 % Apply 1 application topically 2 (two) times daily. 30 g 0  . loratadine (CLARITIN) 10 MG tablet Take 1 tablet (10 mg total) by mouth daily. (Patient taking differently: Take 10 mg by mouth daily as needed. ) 30 tablet 0  . mometasone (NASONEX) 50 MCG/ACT nasal spray Place 2 sprays into the nose daily. (Patient taking differently: Place 2 sprays into the nose daily as needed. ) 17 g 12  . nitroGLYCERIN (NITROSTAT) 0.4 MG SL tablet Place 1 tablet (0.4 mg total) under the tongue every 5 (five) minutes x 3 doses as needed for chest pain. 25 tablet 6  . cyclobenzaprine (FLEXERIL) 10 MG tablet Take 1 tablet (10 mg total) by mouth 2 (two) times daily as needed for muscle spasms. (Patient not taking: Reported on 09/16/2018) 20 tablet 0  . naproxen (NAPROSYN) 500 MG tablet Take 1 tablet (500  mg total) by mouth 2 (two) times daily with a meal. As needed for pain (Patient not taking: Reported on 09/16/2018) 20 tablet 0   No facility-administered medications prior to visit.     ROS See HPI  Objective:  BP 132/74   Pulse 85   Temp 99.5 F (37.5 C) (Oral)   Ht 5\' 6"  (1.676 m)   Wt 208 lb (94.3 kg)   SpO2 95%   BMI 33.57 kg/m   BP Readings from Last 3 Encounters:  09/16/18 132/74  07/19/18 (!) 122/56  06/12/18 136/74    Wt Readings from Last 3 Encounters:  09/16/18 208 lb (94.3 kg)  07/19/18 205 lb (93 kg)  06/12/18 205 lb 12.8 oz (93.4 kg)    Physical Exam  Constitutional: She is oriented to person, place, and time.  HENT:  Right Ear: Tympanic membrane, external ear and ear canal normal.  Left Ear: Tympanic membrane, external ear and ear canal normal.  Nose: Mucosal edema and rhinorrhea present. Right sinus exhibits maxillary sinus tenderness. Right sinus exhibits no frontal sinus tenderness. Left sinus exhibits maxillary sinus tenderness. Left sinus exhibits no frontal sinus tenderness.  Mouth/Throat: Uvula is midline. No trismus in the jaw. Posterior oropharyngeal erythema present. No oropharyngeal exudate.  Eyes: No scleral icterus.  Neck: Normal range of motion. Neck supple.  Cardiovascular: Normal rate and normal heart sounds.  Pulmonary/Chest: Effort normal and breath sounds normal.  Musculoskeletal: She exhibits no edema.  Lymphadenopathy:    She has no cervical adenopathy.  Neurological: She is alert and oriented to person, place, and time.  Vitals reviewed.   Lab Results  Component Value Date   WBC 10.0 03/18/2018   HGB 12.5 03/18/2018   HCT 38.5 03/18/2018   PLT 357.0 03/18/2018   GLUCOSE 122 (H) 07/19/2018   CHOL 143 07/29/2018   TRIG 246.0 (H) 07/29/2018   HDL 36.70 (L) 07/29/2018   LDLDIRECT 63.0 07/29/2018   LDLCALC  07/19/2018     Comment:     . LDL cholesterol not calculated. Triglyceride levels greater than 400 mg/dL invalidate  calculated LDL results. . Reference range: <100 . Desirable range <100 mg/dL for primary prevention;   <70 mg/dL for patients with CHD or diabetic patients  with > or = 2 CHD risk factors. Marland Kitchen LDL-C is now calculated using the Martin-Hopkins  calculation, which is a validated novel method providing  better accuracy than the Friedewald equation in the  estimation of LDL-C.  Cresenciano Genre et al. Annamaria Helling. 7616;073(71): 2061-2068  (http://education.[facility].com/faq/FAQ164)    ALT 26 06/05/2018   AST 20 06/05/2018   NA 140 07/19/2018   K 3.8 07/19/2018   CL 108 07/19/2018   CREATININE 0.87 07/19/2018   BUN 16 07/19/2018   CO2 24 07/19/2018   TSH 3.05 07/19/2018   INR 2.10 (H) 01/12/2012   HGBA1C 5.9 (H) 07/19/2018    Dg Ribs Unilateral W/chest Right  Result Date: 06/08/2018 CLINICAL DATA:  56 year old female with right-sided rib pain, no known injury EXAM: RIGHT RIBS AND CHEST - 3+ VIEW COMPARISON:  Prior chest x-ray 08/17/2016 FINDINGS: The lungs are clear and negative for focal airspace consolidation, pulmonary edema or suspicious pulmonary nodule. No pleural effusion or pneumothorax. Cardiac and mediastinal contours are within normal limits. No acute fracture or lytic or blastic osseous lesions. The visualized upper abdominal bowel gas pattern is unremarkable. IMPRESSION: No evidence of acute rib injury or lesion. Electronically Signed   By: Kaitlyn Bowman M.D.   On: 06/08/2018 13:40    Assessment & Plan:   Kaitlyn Bowman was seen today for nasal congestion.  Diagnoses and all orders for this visit:  Flu-like symptoms -     HYDROcodone-homatropine (HYCODAN) 5-1.5 MG/5ML syrup; Take 5 mLs by mouth every 12 (twelve) hours as needed for cough. -     oseltamivir (TAMIFLU) 75 MG capsule; Take 1 capsule (75 mg total) by mouth 2 (two) times daily.   I have discontinued Kaitlyn Bowman's naproxen and cyclobenzaprine. I am also having her start on HYDROcodone-homatropine and oseltamivir.  Additionally, I am having her maintain her aspirin EC, nitroGLYCERIN, loratadine, mometasone, Alirocumab, and betamethasone valerate ointment.  Meds ordered this encounter  Medications  . HYDROcodone-homatropine (HYCODAN) 5-1.5 MG/5ML syrup    Sig: Take 5 mLs by mouth every 12 (twelve) hours as needed for cough.    Dispense:  60 mL    Refill:  0    Order Specific Question:   Supervising Provider    Answer:   Lucille Passy [[id number]]  . oseltamivir (TAMIFLU) 75 MG capsule    Sig: Take 1 capsule (75 mg total) by mouth 2 (two) times daily.    Dispense:  10 capsule    Refill:  0    Order Specific Question:   Supervising Provider    Answer:   Lucille Passy [[id number]]    Problem List Items Addressed This Visit    None    Visit Diagnoses    Flu-like symptoms    -  Primary  Relevant Medications   HYDROcodone-homatropine (HYCODAN) 5-1.5 MG/5ML syrup   oseltamivir (TAMIFLU) 75 MG capsule       Follow-up: No follow-ups on file.  Wilfred Lacy, NP

## 2018-09-16 NOTE — Patient Instructions (Signed)

## 2018-09-17 ENCOUNTER — Encounter: Payer: Self-pay | Admitting: Nurse Practitioner

## 2018-09-19 ENCOUNTER — Ambulatory Visit (INDEPENDENT_AMBULATORY_CARE_PROVIDER_SITE_OTHER): Payer: BLUE CROSS/BLUE SHIELD | Admitting: Nurse Practitioner

## 2018-09-19 ENCOUNTER — Encounter: Payer: Self-pay | Admitting: Nurse Practitioner

## 2018-09-19 VITALS — BP 120/72 | HR 82 | Temp 97.9°F | Ht 66.0 in | Wt 204.8 lb

## 2018-09-19 DIAGNOSIS — R51 Headache: Secondary | ICD-10-CM

## 2018-09-19 DIAGNOSIS — J0110 Acute frontal sinusitis, unspecified: Secondary | ICD-10-CM | POA: Diagnosis not present

## 2018-09-19 DIAGNOSIS — R519 Headache, unspecified: Secondary | ICD-10-CM

## 2018-09-19 MED ORDER — SALINE SPRAY 0.65 % NA SOLN: 1.0000 | NASAL | 0 refills | Status: DC | PRN

## 2018-09-19 MED ORDER — PREDNISONE 20 MG PO TABS: 40.0000 mg | ORAL_TABLET | Freq: Every day | ORAL | 0 refills | Status: DC

## 2018-09-19 MED ORDER — KETOROLAC TROMETHAMINE 30 MG/ML IJ SOLN
30.0000 mg | Freq: Once | INTRAMUSCULAR | Status: AC
  Administered 2018-09-19: 30 mg via INTRAMUSCULAR

## 2018-09-19 MED ORDER — GUAIFENESIN ER 600 MG PO TB12: 600.0000 mg | ORAL_TABLET | Freq: Two times a day (BID) | ORAL | 0 refills | Status: DC | PRN

## 2018-09-19 MED ORDER — AZITHROMYCIN 250 MG PO TABS: 250.0000 mg | ORAL_TABLET | Freq: Every day | ORAL | 0 refills | Status: DC

## 2018-09-19 NOTE — Progress Notes (Signed)
Subjective:  Patient ID: Kaitlyn Bowman, female    DOB: 03/06/1961  Age: 57 y.o. MRN: [id number]  CC: Headache (headache,pressure on in head going on 4 days,taking tylenol not helping much. )  Headache   This is a new problem. The current episode started in the past 7 days. The problem occurs constantly. The problem has been unchanged. The pain is located in the frontal region. The pain does not radiate. The quality of the pain is described as aching, dull and throbbing. The pain is at a severity of 9/10. Associated symptoms include coughing, drainage, rhinorrhea and sinus pressure. Pertinent negatives include no anorexia, back pain, blurred vision, dizziness, ear pain, eye pain, eye redness, eye watering, fever, hearing loss, insomnia, loss of balance, muscle aches, nausea, neck pain, numbness, phonophobia, photophobia, scalp tenderness, sore throat, swollen glands, tingling, tinnitus, visual change, vomiting or weakness. Nothing aggravates the symptoms. She has tried nothing for the symptoms. Her past medical history is significant for obesity and sinus disease. There is no history of hypertension or immunosuppression.   Reviewed past Medical, Social and Family history today.  Outpatient Medications Prior to Visit  Medication Sig Dispense Refill  . Alirocumab (PRALUENT) 150 MG/ML SOPN Inject 1 pen into the skin every 14 (fourteen) days. 2 pen 11  . aspirin EC 81 MG tablet Take 1 tablet (81 mg total) by mouth daily. 90 tablet 3  . betamethasone valerate ointment (VALISONE) 0.1 % Apply 1 application topically 2 (two) times daily. 30 g 0  . HYDROcodone-homatropine (HYCODAN) 5-1.5 MG/5ML syrup Take 5 mLs by mouth every 12 (twelve) hours as needed for cough. 60 mL 0  . loratadine (CLARITIN) 10 MG tablet Take 1 tablet (10 mg total) by mouth daily. (Patient taking differently: Take 10 mg by mouth daily as needed. ) 30 tablet 0  . mometasone (NASONEX) 50 MCG/ACT nasal spray Place 2 sprays into the nose  daily. (Patient taking differently: Place 2 sprays into the nose daily as needed. ) 17 g 12  . nitroGLYCERIN (NITROSTAT) 0.4 MG SL tablet Place 1 tablet (0.4 mg total) under the tongue every 5 (five) minutes x 3 doses as needed for chest pain. 25 tablet 6  . oseltamivir (TAMIFLU) 75 MG capsule Take 1 capsule (75 mg total) by mouth 2 (two) times daily. 10 capsule 0   No facility-administered medications prior to visit.     ROS See HPI  Objective:  BP 120/72   Pulse 82   Temp 97.9 F (36.6 C) (Oral)   Ht 5\' 6"  (1.676 m)   Wt 204 lb 12.8 oz (92.9 kg)   SpO2 96%   BMI 33.06 kg/m   BP Readings from Last 3 Encounters:  09/19/18 120/72  09/16/18 132/74  07/19/18 (!) 122/56    Wt Readings from Last 3 Encounters:  09/19/18 204 lb 12.8 oz (92.9 kg)  09/16/18 208 lb (94.3 kg)  07/19/18 205 lb (93 kg)    Physical Exam Constitutional:      Appearance: She is obese.  HENT:     Nose: Mucosal edema and rhinorrhea present.     Right Turbinates: Swollen.     Left Turbinates: Swollen.     Right Sinus: Frontal sinus tenderness present. No maxillary sinus tenderness.     Left Sinus: Frontal sinus tenderness present. No maxillary sinus tenderness.  Eyes:     Extraocular Movements: Extraocular movements intact.     Pupils: Pupils are equal, round, and reactive to light.  Neck:  Musculoskeletal: Normal range of motion and neck supple.  Cardiovascular:     Rate and Rhythm: Normal rate.  Pulmonary:     Effort: Pulmonary effort is normal.  Lymphadenopathy:     Cervical: No cervical adenopathy.  Neurological:     Mental Status: She is alert.     Lab Results  Component Value Date   WBC 10.0 03/18/2018   HGB 12.5 03/18/2018   HCT 38.5 03/18/2018   PLT 357.0 03/18/2018   GLUCOSE 122 (H) 07/19/2018   CHOL 143 07/29/2018   TRIG 246.0 (H) 07/29/2018   HDL 36.70 (L) 07/29/2018   LDLDIRECT 63.0 07/29/2018   LDLCALC  07/19/2018     Comment:     . LDL cholesterol not calculated.  Triglyceride levels greater than 400 mg/dL invalidate calculated LDL results. . Reference range: <100 . Desirable range <100 mg/dL for primary prevention;   <70 mg/dL for patients with CHD or diabetic patients  with > or = 2 CHD risk factors. Marland Kitchen LDL-C is now calculated using the Martin-Hopkins  calculation, which is a validated novel method providing  better accuracy than the Friedewald equation in the  estimation of LDL-C.  Cresenciano Genre et al. Annamaria Helling. 5643;329(51): 2061-2068  (http://education.[facility].com/faq/FAQ164)    ALT 26 06/05/2018   AST 20 06/05/2018   NA 140 07/19/2018   K 3.8 07/19/2018   CL 108 07/19/2018   CREATININE 0.87 07/19/2018   BUN 16 07/19/2018   CO2 24 07/19/2018   TSH 3.05 07/19/2018   INR 2.10 (H) 01/12/2012   HGBA1C 5.9 (H) 07/19/2018    Dg Ribs Unilateral W/chest Right  Result Date: 06/08/2018 CLINICAL DATA:  57 year old female with right-sided rib pain, no known injury EXAM: RIGHT RIBS AND CHEST - 3+ VIEW COMPARISON:  Prior chest x-ray 08/17/2016 FINDINGS: The lungs are clear and negative for focal airspace consolidation, pulmonary edema or suspicious pulmonary nodule. No pleural effusion or pneumothorax. Cardiac and mediastinal contours are within normal limits. No acute fracture or lytic or blastic osseous lesions. The visualized upper abdominal bowel gas pattern is unremarkable. IMPRESSION: No evidence of acute rib injury or lesion. Electronically Signed   By: Jacqulynn Cadet M.D.   On: 06/08/2018 13:40    Assessment & Plan:   Natiya was seen today for headache.  Diagnoses and all orders for this visit:  Acute non-recurrent frontal sinusitis -     guaiFENesin (MUCINEX) 600 MG 12 hr tablet; Take 1 tablet (600 mg total) by mouth 2 (two) times daily as needed for cough or to loosen phlegm. -     sodium chloride (OCEAN) 0.65 % SOLN nasal spray; Place 1 spray into both nostrils as needed for congestion. -     predniSONE (DELTASONE) 20 MG tablet;  Take 2 tablets (40 mg total) by mouth daily with breakfast. -     azithromycin (ZITHROMAX Z-PAK) 250 MG tablet; Take 1 tablet (250 mg total) by mouth daily. Take 2tabs on first day, then 1tab once a day till complete  Sinus headache -     ketorolac (TORADOL) 30 MG/ML injection 30 mg   I am having Cniyah A. Gates start on guaiFENesin, sodium chloride, predniSONE, and azithromycin. I am also having her maintain her aspirin EC, nitroGLYCERIN, loratadine, mometasone, Alirocumab, betamethasone valerate ointment, HYDROcodone-homatropine, and oseltamivir. We will continue to administer ketorolac.  Meds ordered this encounter  Medications  . ketorolac (TORADOL) 30 MG/ML injection 30 mg  . guaiFENesin (MUCINEX) 600 MG 12 hr tablet    Sig: Take  1 tablet (600 mg total) by mouth 2 (two) times daily as needed for cough or to loosen phlegm.    Dispense:  14 tablet    Refill:  0    Order Specific Question:   Supervising Provider    Answer:   Lucille Passy [[id number]]  . sodium chloride (OCEAN) 0.65 % SOLN nasal spray    Sig: Place 1 spray into both nostrils as needed for congestion.    Dispense:  15 mL    Refill:  0    Order Specific Question:   Supervising Provider    Answer:   Lucille Passy [[id number]]  . predniSONE (DELTASONE) 20 MG tablet    Sig: Take 2 tablets (40 mg total) by mouth daily with breakfast.    Dispense:  6 tablet    Refill:  0    Order Specific Question:   Supervising Provider    Answer:   Lucille Passy [[id number]]  . azithromycin (ZITHROMAX Z-PAK) 250 MG tablet    Sig: Take 1 tablet (250 mg total) by mouth daily. Take 2tabs on first day, then 1tab once a day till complete    Dispense:  6 tablet    Refill:  0    Order Specific Question:   Supervising Provider    Answer:   Lucille Passy [[id number]]    Problem List Items Addressed This Visit    None    Visit Diagnoses    Acute non-recurrent frontal sinusitis    -  Primary   Relevant Medications   guaiFENesin (MUCINEX) 600 MG 12 hr tablet    sodium chloride (OCEAN) 0.65 % SOLN nasal spray   predniSONE (DELTASONE) 20 MG tablet   azithromycin (ZITHROMAX Z-PAK) 250 MG tablet   Sinus headache       Relevant Medications   ketorolac (TORADOL) 30 MG/ML injection 30 mg       Follow-up: Return if symptoms worsen or fail to improve.  Wilfred Lacy, NP

## 2018-09-19 NOTE — Patient Instructions (Signed)
Sinus Headache A sinus headache occurs when the paranasal sinuses become clogged or swollen. Paranasal sinuses are air pockets within the bones of the face. Sinus headaches can range from mild to severe. What are the causes? A sinus headache can result from various conditions that affect the sinuses, such as:  Colds.  Sinus infections.  Allergies.  What are the signs or symptoms? The main symptom of this condition is a headache that may feel like pain or pressure in the face, forehead, ears, or upper teeth. People who have a sinus headache often have other symptoms, such as:  Congested or runny nose.  Fever.  Inability to smell.  Weather changes can make symptoms worse. How is this diagnosed? This condition may be diagnosed based on:  A physical exam and medical history.  Imaging tests, such as a CT scan and MRI, to check for problems with the sinuses.  A specialist may look into the sinuses with a tool that has a camera (endoscopy).  How is this treated? Treatment for this condition depends on the cause.  Sinus pain that is caused by a sinus infection may be treated with antibiotic medicine.  Sinus pain that is caused by allergies may be helped by allergy medicines (antihistamines) and medicated nasal sprays.  Sinus pain that is caused by congestion may be helped by flushing the nose and sinuses with saline solution.  Follow these instructions at home:  Take medicines only as directed by your health care provider.  If you were prescribed an antibiotic medicine, finish all of it even if you start to feel better.  If you have congestion, use a nasal spray to help reduce pressure.  If directed, apply a warm, moist washcloth to your face to help relieve pain. Contact a health care provider if:  You have headaches more than one time each week.  You have sensitivity to light or sound.  You have a fever.  You feel sick to your stomach (nauseous) or you throw up  (vomit).  Your headaches do not get better with treatment. Many people think that they have a sinus headache when they actually have migraines or tension headaches. Get help right away if:  You have vision problems.  You have sudden, severe pain in your face or head.  You have a seizure.  You are confused.  You have a stiff neck. This information is not intended to replace advice given to you by your health care provider. Make sure you discuss any questions you have with your health care provider. Document Released: 11/02/2004 Document Revised: 05/21/2016 Document Reviewed: 09/21/2014 Elsevier Interactive Patient Education  2018 [employer]  

## 2018-10-25 ENCOUNTER — Ambulatory Visit (INDEPENDENT_AMBULATORY_CARE_PROVIDER_SITE_OTHER): Payer: BLUE CROSS/BLUE SHIELD | Admitting: Medical

## 2018-10-25 ENCOUNTER — Encounter: Payer: Self-pay | Admitting: Medical

## 2018-10-25 VITALS — BP 133/65 | HR 74 | Temp 97.8°F | Resp 16 | Ht 66.0 in | Wt 207.2 lb

## 2018-10-25 DIAGNOSIS — R05 Cough: Secondary | ICD-10-CM | POA: Diagnosis not present

## 2018-10-25 DIAGNOSIS — R0981 Nasal congestion: Secondary | ICD-10-CM | POA: Diagnosis not present

## 2018-10-25 DIAGNOSIS — R059 Cough, unspecified: Secondary | ICD-10-CM

## 2018-10-25 DIAGNOSIS — J40 Bronchitis, not specified as acute or chronic: Secondary | ICD-10-CM

## 2018-10-25 DIAGNOSIS — J0100 Acute maxillary sinusitis, unspecified: Secondary | ICD-10-CM

## 2018-10-25 MED ORDER — BENZONATATE 100 MG PO CAPS: 100.0000 mg | ORAL_CAPSULE | Freq: Three times a day (TID) | ORAL | 0 refills | Status: DC | PRN

## 2018-10-25 MED ORDER — METHYLPREDNISOLONE ACETATE 40 MG/ML IJ SUSP
40.0000 mg | Freq: Once | INTRAMUSCULAR | Status: AC
  Administered 2018-10-25: 40 mg via INTRAMUSCULAR

## 2018-10-25 MED ORDER — DOXYCYCLINE HYCLATE 100 MG PO TABS: 100.0000 mg | ORAL_TABLET | Freq: Two times a day (BID) | ORAL | 0 refills | Status: DC

## 2018-10-25 MED ORDER — FLUTICASONE PROPIONATE 50 MCG/ACT NA SUSP: 2.0000 | Freq: Every day | NASAL | 1 refills | Status: DC

## 2018-10-25 NOTE — Patient Instructions (Addendum)
You appear to have bronchitis and sinusitis(more sinus infection). Rest hydrate and tylenol for fever. I am prescribing cough medicine benzonatate, and doxycycline antibiotic. For your nasal congestion I prescribed flonase nasal spray.  Also after discussion, decided to give you depo medrol 40 mg IM to help with your severe nasal congestion.  You should gradually get better. If not then notify us and would recommend a chest xray.  Follow up in 7-10 days or as needed

## 2018-10-25 NOTE — Progress Notes (Signed)
Subjective:    Patient ID: Kaitlyn Bowman, female    DOB: [birth year], 59 y.o.   MRN: [id number]  HPI   Pt in for recent st, nasal congestion, ear irritation, ear itching, and sinus pain. Very nasal congested. She request injection to help decongest her.  Pt in early December had flu like illness. Severe type symptoms described. She was given tamiflu. She got better with treatment. Pt states strongly disliked tast of tamiflu. States never wants to take gain.  When she blows her nose gets colored mucus. She has some productive cough.   Review of Systems  Constitutional: Negative for chills, fatigue and fever.  HENT: Positive for congestion, sinus pressure, sinus pain and sore throat.   Eyes: Negative for pain.  Respiratory: Positive for cough. Negative for chest tightness, shortness of breath and wheezing.   Cardiovascular: Negative for chest pain and palpitations.  Gastrointestinal: Negative for abdominal pain.  Musculoskeletal: Negative for back pain and joint swelling.       Faint muscle aches. Pt expresses nothing like early December when dx with flu.  Neurological: Negative for dizziness, speech difficulty, weakness and light-headedness.  Hematological: Negative for adenopathy. Does not bruise/bleed easily.  Psychiatric/Behavioral: Negative for behavioral problems and decreased concentration.    Past Medical History:  Diagnosis Date  . Abnormal TSH   . Alcohol abuse   . Cocaine abuse ([place])    Remote crack cocaine use. UDS negative for cocaine 01/2012  . Coronary artery disease     inferior STEMI 01/13/12 s/p PTCA/DES to mid RCA. Initial EF 45% by cath but improved to 55-60% by echo 01/14/12  . Dyslipidemia    Trig 201, HDL 36, LDL 197 01/2012  . GERD (gastroesophageal reflux disease)   . History of MI (myocardial infarction) 01/2012  . Hypothyroidism   . Myocardial infarction ([place]) 2012  . Umbilical hernia      Social History   Socioeconomic History  . Marital status:  Single    Spouse name: Not on file  . Number of children: 1  . Years of education: Not on file  . Highest education level: Not on file  Occupational History  . Occupation: Best boy: [employer]  Social Needs  . Financial resource strain: Not on file  . Food insecurity:    Worry: Not on file    Inability: Not on file  . Transportation needs:    Medical: Not on file    Non-medical: Not on file  Tobacco Use  . Smoking status: Former Smoker    Years: 33.00    Types: Cigarettes    Last attempt to quit: 06/13/2008    Years since quitting: 10.3  . Smokeless tobacco: Never Used  . Tobacco comment: None since 2009   Substance and Sexual Activity  . Alcohol use: No    Comment: former  . Drug use: No    Types: "Crack" cocaine    Comment: Crack cocaine; none since Jan 2006   . Sexual activity: Not on file  Lifestyle  . Physical activity:    Days per week: Not on file    Minutes per session: Not on file  . Stress: Not on file  Relationships  . Social connections:    Talks on phone: Not on file    Gets together: Not on file    Attends religious service: Not on file    Active member of club or organization: Not on file    Attends meetings  of clubs or organizations: Not on file    Relationship status: Not on file  . Intimate partner violence:    Fear of current or ex partner: Not on file    Emotionally abused: Not on file    Physically abused: Not on file    Forced sexual activity: Not on file  Other Topics Concern  . Not on file  Social History Narrative   Regular exercise:  Walks 5-7 x weekly   Caffeine use:  1 daily   Works at an independent living   Single   Daughter age 76 and 70 yr old grandson- daughter lives in [place].   Enjoys reading, sewing, Fill in puzzles, walking   Completed GED.   No pets.                Past Surgical History:  Procedure Laterality Date  . ABDOMINAL HYSTERECTOMY  1981   partial  . CARDIAC CATHETERIZATION  2010, 2013    Negative. 2013--100% blockage  . HERNIA REPAIR  2017   abd hernia   . LEFT HEART CATHETERIZATION WITH CORONARY ANGIOGRAM N/A 01/12/2012   Procedure: LEFT HEART CATHETERIZATION WITH CORONARY ANGIOGRAM;  Surgeon: Burnell Blanks, MD;  Location: [facility] CATH LAB;  Service: Cardiovascular;  Laterality: N/A;    Family History  Problem Relation Age of Onset  . Heart attack Mother 61       prior pacemaker  . Heart disease Mother   . Diabetes Mother   . Heart failure Father        CHF  . Heart disease Father   . Alcoholism Father   . Stroke Brother     Allergies  Allergen Reactions  . Ciprofloxacin     REACTION: nause-sweating-chest tightness  . Atorvastatin     Myalgia   . Crestor [Rosuvastatin Calcium] Nausea Only  . Livalo [Pitavastatin]     myalgia    Current Outpatient Medications on File Prior to Visit  Medication Sig Dispense Refill  . Alirocumab (PRALUENT) 150 MG/ML SOPN Inject 1 pen into the skin every 14 (fourteen) days. 2 pen 11  . aspirin EC 81 MG tablet Take 1 tablet (81 mg total) by mouth daily. 90 tablet 3  . nitroGLYCERIN (NITROSTAT) 0.4 MG SL tablet Place 1 tablet (0.4 mg total) under the tongue every 5 (five) minutes x 3 doses as needed for chest pain. 25 tablet 6  . sodium chloride (OCEAN) 0.65 % SOLN nasal spray Place 1 spray into both nostrils as needed for congestion. 15 mL 0   No current facility-administered medications on file prior to visit.     BP 133/65   Pulse 74   Temp 97.8 F (36.6 C) (Oral)   Resp 16   Ht 5\' 6"  (1.676 m)   Wt 207 lb 3.2 oz (94 kg)   SpO2 96%   BMI 33.44 kg/m       Objective:   Physical Exam  General  Mental Status - Alert. General Appearance - Well groomed. Not in acute distress.  Skin Rashes- No Rashes.  HEENT Head- Normal. Ear Auditory Canal - Left- Normal. Right - Normal.Tympanic Membrane- Left- Normal. Right- Normal. Eye Sclera/Conjunctiva- Left- Normal. Right- Normal. Nose & Sinuses Nasal Mucosa- Left-   Boggy and Congested. Right-  Boggy and  Congested.Bilateral maxillary and frontal sinus pressure. Mouth & Throat Lips: Upper Lip- Normal: no dryness, cracking, pallor, cyanosis, or vesicular eruption. Lower Lip-Normal: no dryness, cracking, pallor, cyanosis or vesicular eruption. Buccal Mucosa- Bilateral- No  Aphthous ulcers. Oropharynx- No Discharge or Erythema. Tonsils: Characteristics- Bilateral- No Erythema or Congestion. Size/Enlargement- Bilateral- No enlargement. Discharge- bilateral-None.  Neck Neck- Supple. No Masses.   Chest and Lung Exam Auscultation: Breath Sounds:-Clear even and unlabored.  Cardiovascular Auscultation:Rythm- Regular, rate and rhythm. Murmurs & Other Heart Sounds:Ausculatation of the heart reveal- No Murmurs.  Lymphatic Head & Neck General Head & Neck Lymphatics: Bilateral: Description- No Localized lymphadenopathy.       Assessment & Plan:   You appear to have bronchitis and sinusitis(more sinus infection). Rest hydrate and tylenol for fever. I am prescribing cough medicine benzonatate, and doxycyclnie antibiotic. For your nasal congestion I prescribed flonase nasal spray.  Also after discussion, decided to give you depomedrol 40 mg IM to help with your severe nasal congestion.  You should gradually get better. If not then notify us and would recommend a chest xray.  Follow up in 7-10 days or as needed  [employer], [employer]

## 2018-11-08 ENCOUNTER — Ambulatory Visit: Payer: BLUE CROSS/BLUE SHIELD | Admitting: Family

## 2018-11-08 DIAGNOSIS — Z0289 Encounter for other administrative examinations: Secondary | ICD-10-CM

## 2018-11-15 IMAGING — DX DG RIBS W/ CHEST 3+V*R*
3 series · 3 of 3 positions shown · non-contrast
Comparison: Prior chest x-ray 08/17/2016

CLINICAL DATA: 56-year-old female with right-sided rib pain, no
known injury

EXAM:
RIGHT RIBS AND CHEST - 3+ VIEW

[chest pa]
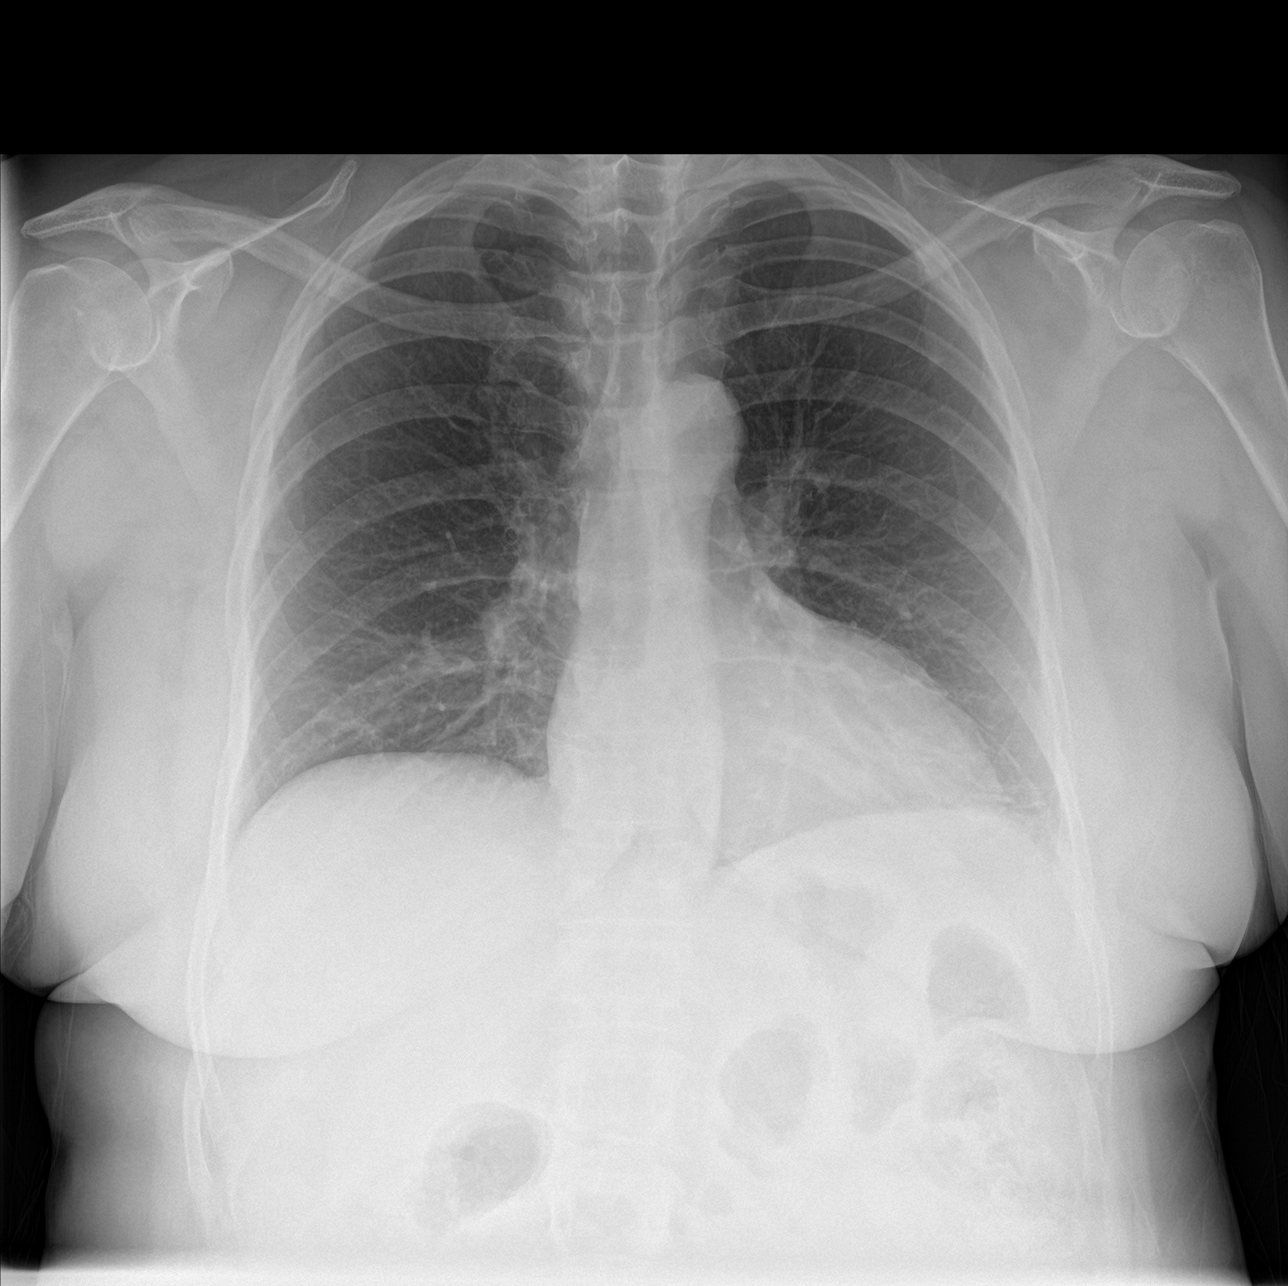

[rib ap]
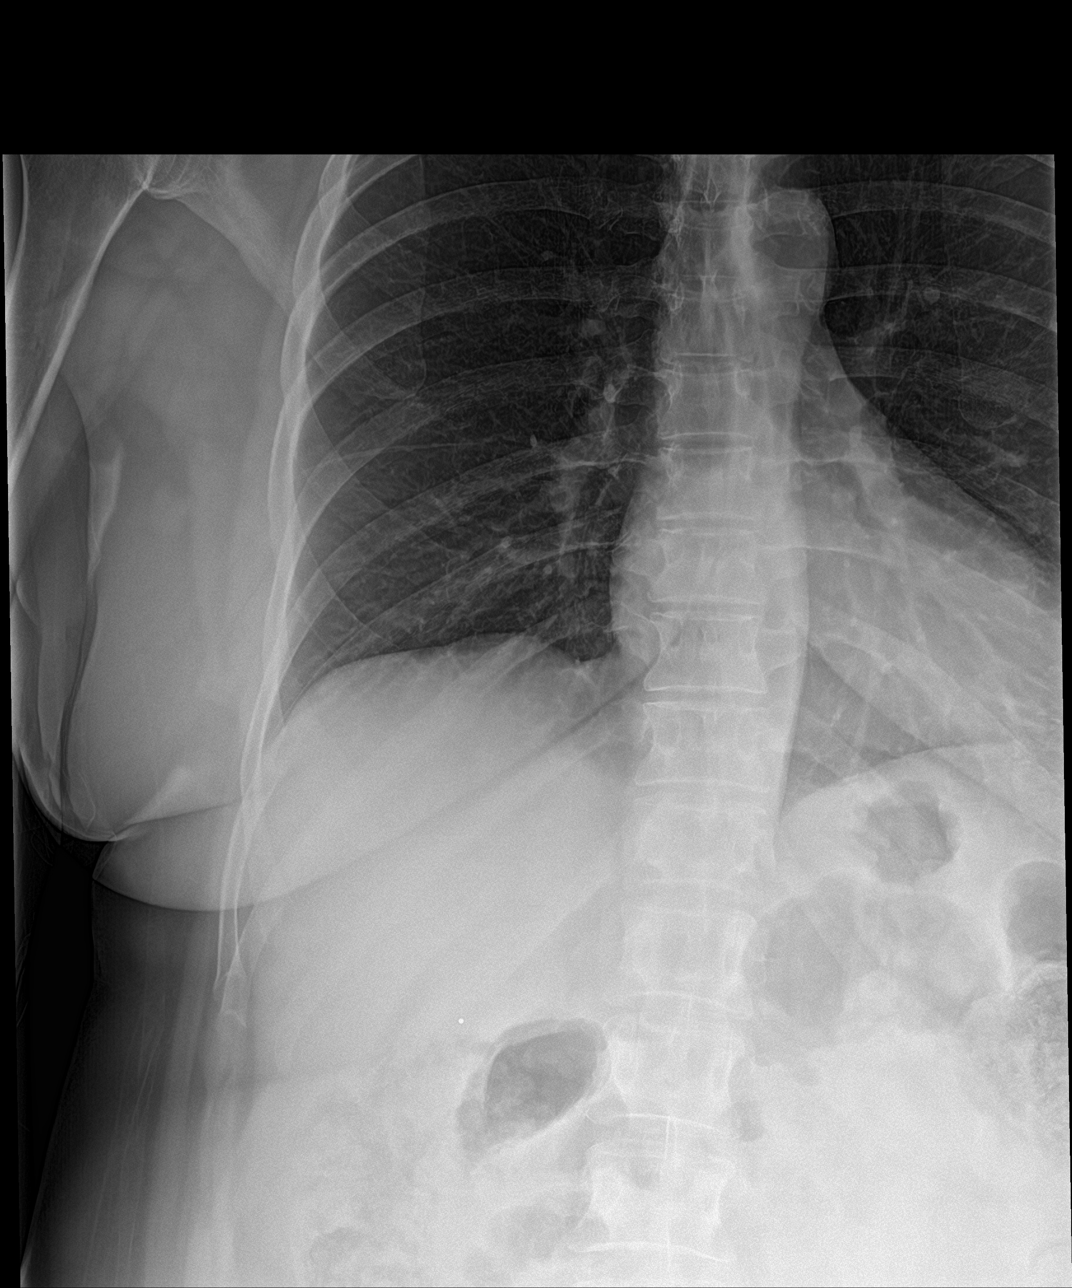

[rib ap obl]
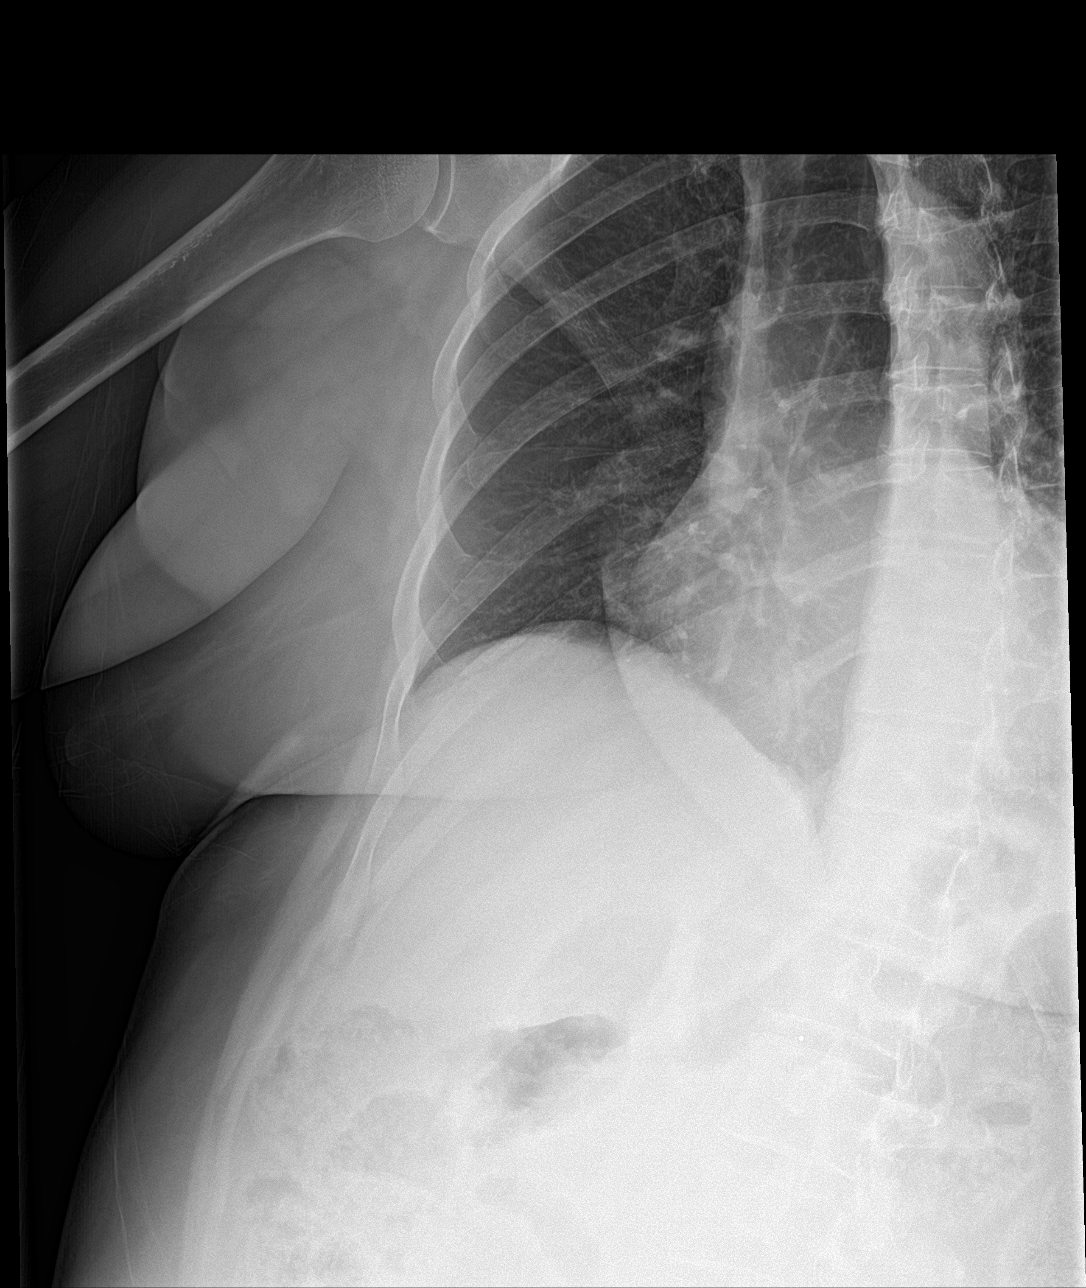

[3 of 3 positions shown; findings below may reference images not displayed]

FINDINGS: The lungs are clear and negative for focal airspace consolidation,
pulmonary edema or suspicious pulmonary nodule. No pleural effusion
or pneumothorax. Cardiac and mediastinal contours are within normal
limits. No acute fracture or lytic or blastic osseous lesions. The
visualized upper abdominal bowel gas pattern is unremarkable.
IMPRESSION: No evidence of acute rib injury or lesion.

## 2018-11-18 ENCOUNTER — Telehealth: Payer: Self-pay | Admitting: Family

## 2018-11-18 ENCOUNTER — Encounter: Payer: Self-pay | Admitting: Family

## 2018-11-18 ENCOUNTER — Ambulatory Visit (INDEPENDENT_AMBULATORY_CARE_PROVIDER_SITE_OTHER): Payer: BLUE CROSS/BLUE SHIELD | Admitting: Family

## 2018-11-18 VITALS — BP 132/59 | HR 64 | Temp 98.3°F | Resp 16 | Ht 66.0 in | Wt 205.0 lb

## 2018-11-18 DIAGNOSIS — E039 Hypothyroidism, unspecified: Secondary | ICD-10-CM | POA: Diagnosis not present

## 2018-11-18 DIAGNOSIS — R0989 Other specified symptoms and signs involving the circulatory and respiratory systems: Secondary | ICD-10-CM

## 2018-11-18 DIAGNOSIS — E785 Hyperlipidemia, unspecified: Secondary | ICD-10-CM | POA: Diagnosis not present

## 2018-11-18 DIAGNOSIS — I2584 Coronary atherosclerosis due to calcified coronary lesion: Secondary | ICD-10-CM

## 2018-11-18 DIAGNOSIS — I2510 Atherosclerotic heart disease of native coronary artery without angina pectoris: Secondary | ICD-10-CM | POA: Diagnosis not present

## 2018-11-18 NOTE — Progress Notes (Signed)
Subjective:    Patient ID: Kaitlyn Bowman, female    DOB: 1960/10/16, 58 y.o.   MRN: [id number]  HPI   Patient is a 58 yr old female who presents today for follow up.  Hypothyroid- not currently on synthroid. Has been stable off synthoid.  Lab Results  Component Value Date   TSH 3.05 07/19/2018   Hyperlipidemia- patient is maintained on praluent 150mg . Working on her healthy diet and walking more.    Lab Results  Component Value Date   CHOL 143 07/29/2018   HDL 36.70 (L) 07/29/2018   LDLCALC  07/19/2018     Comment:     . LDL cholesterol not calculated. Triglyceride levels greater than 400 mg/dL invalidate calculated LDL results. . Reference range: <100 . Desirable range <100 mg/dL for primary prevention;   <70 mg/dL for patients with CHD or diabetic patients  with > or = 2 CHD risk factors. Marland Kitchen LDL-C is now calculated using the Martin-Hopkins  calculation, which is a validated novel method providing  better accuracy than the Friedewald equation in the  estimation of LDL-C.  Cresenciano Genre et al. Annamaria Helling. 3474;259(56): 2061-2068  (http://education.[facility].com/faq/FAQ164)    LDLDIRECT 63.0 07/29/2018   TRIG 246.0 (H) 07/29/2018   CHOLHDL 4 07/29/2018   CAD- continues med management with praluent and aspirin. She is intolerant to statins and beta blockers. She is followed by cardiology (Dr. Julianne Handler) and last visit was 06/12/18.   Wt Readings from Last 3 Encounters:  11/18/18 205 lb (93 kg)  10/25/18 207 lb 3.2 oz (94 kg)  09/19/18 204 lb 12.8 oz (92.9 kg)    Reports that she had some mild chest congestion. Denies fever.   Review of Systems See HPI  Past Medical History:  Diagnosis Date  . Abnormal TSH   . Alcohol abuse   . Cocaine abuse ([place])    Remote crack cocaine use. UDS negative for cocaine 01/2012  . Coronary artery disease     inferior STEMI 01/13/12 s/p PTCA/DES to mid RCA. Initial EF 45% by cath but improved to 55-60% by echo 01/14/12  . Dyslipidemia     Trig 201, HDL 36, LDL 197 01/2012  . GERD (gastroesophageal reflux disease)   . History of MI (myocardial infarction) 01/2012  . Hypothyroidism   . Myocardial infarction ([place]) 2012  . Umbilical hernia      Social History   Socioeconomic History  . Marital status: Single    Spouse name: Not on file  . Number of children: 1  . Years of education: Not on file  . Highest education level: Not on file  Occupational History  . Occupation: Best boy: [employer]  Social Needs  . Financial resource strain: Not on file  . Food insecurity:    Worry: Not on file    Inability: Not on file  . Transportation needs:    Medical: Not on file    Non-medical: Not on file  Tobacco Use  . Smoking status: Former Smoker    Years: 33.00    Types: Cigarettes    Last attempt to quit: 06/13/2008    Years since quitting: 10.4  . Smokeless tobacco: Never Used  . Tobacco comment: None since 2009   Substance and Sexual Activity  . Alcohol use: No    Comment: former  . Drug use: No    Types: "Crack" cocaine    Comment: Crack cocaine; none since Jan 2006   . Sexual activity: Not on  file  Lifestyle  . Physical activity:    Days per week: Not on file    Minutes per session: Not on file  . Stress: Not on file  Relationships  . Social connections:    Talks on phone: Not on file    Gets together: Not on file    Attends religious service: Not on file    Active member of club or organization: Not on file    Attends meetings of clubs or organizations: Not on file    Relationship status: Not on file  . Intimate partner violence:    Fear of current or ex partner: Not on file    Emotionally abused: Not on file    Physically abused: Not on file    Forced sexual activity: Not on file  Other Topics Concern  . Not on file  Social History Narrative   Regular exercise:  Walks 5-7 x weekly   Caffeine use:  1 daily   Works at an independent living   Single   Daughter age 59 and 74 yr old grandson-  daughter lives in [place].   Enjoys reading, sewing, Fill in puzzles, walking   Completed GED.   No pets.                Past Surgical History:  Procedure Laterality Date  . ABDOMINAL HYSTERECTOMY  1981   partial  . CARDIAC CATHETERIZATION  2010, 2013   Negative. 2013--100% blockage  . HERNIA REPAIR  2017   abd hernia   . LEFT HEART CATHETERIZATION WITH CORONARY ANGIOGRAM N/A 01/12/2012   Procedure: LEFT HEART CATHETERIZATION WITH CORONARY ANGIOGRAM;  Surgeon: Burnell Blanks, MD;  Location: [facility] CATH LAB;  Service: Cardiovascular;  Laterality: N/A;    Family History  Problem Relation Age of Onset  . Heart attack Mother 72       prior pacemaker  . Heart disease Mother   . Diabetes Mother   . Heart failure Father        CHF  . Heart disease Father   . Alcoholism Father   . Stroke Brother     Allergies  Allergen Reactions  . Ciprofloxacin     REACTION: nause-sweating-chest tightness  . Atorvastatin     Myalgia   . Crestor [Rosuvastatin Calcium] Nausea Only  . Livalo [Pitavastatin]     myalgia    Current Outpatient Medications on File Prior to Visit  Medication Sig Dispense Refill  . Alirocumab (PRALUENT) 150 MG/ML SOPN Inject 1 pen into the skin every 14 (fourteen) days. 2 pen 11  . aspirin EC 81 MG tablet Take 1 tablet (81 mg total) by mouth daily. 90 tablet 3  . fluticasone (FLONASE) 50 MCG/ACT nasal spray Place 2 sprays into both nostrils daily. 16 g 1  . nitroGLYCERIN (NITROSTAT) 0.4 MG SL tablet Place 1 tablet (0.4 mg total) under the tongue every 5 (five) minutes x 3 doses as needed for chest pain. 25 tablet 6  . sodium chloride (OCEAN) 0.65 % SOLN nasal spray Place 1 spray into both nostrils as needed for congestion. 15 mL 0   No current facility-administered medications on file prior to visit.     BP (!) 132/59 (BP Location: Left Arm, Patient Position: Sitting, Cuff Size: Large)   Pulse 64   Temp 98.3 F (36.8 C) (Oral)   Resp 16   Ht 5\' 6"   (1.676 m)   Wt 205 lb (93 kg)   SpO2 98%   BMI 33.09  kg/m       Objective:   Physical Exam Constitutional:      Appearance: She is well-developed.  Neck:     Musculoskeletal: Neck supple.     Thyroid: No thyromegaly.  Cardiovascular:     Rate and Rhythm: Normal rate and regular rhythm.     Heart sounds: Normal heart sounds. No murmur.  Pulmonary:     Effort: Pulmonary effort is normal. No respiratory distress.     Breath sounds: Normal breath sounds. No wheezing.  Skin:    General: Skin is warm and dry.  Neurological:     Mental Status: She is alert and oriented to person, place, and time.  Psychiatric:        Behavior: Behavior normal.        Thought Content: Thought content normal.        Judgment: Judgment normal.           Assessment & Plan:  Hx hypothyroid- last tsh was normal last visit. Monitor off of synthroid. Clinically stable.  Plan TSH next visit.  Hyperlipidemia- she continues praluent. It appears she is past due for follow up with the lipid clinic.  Advised pt to schedule follow up with lipid clinic.  CAD- clinically stable and managed medically on ASA per cardiology.   Chest congestion- mild. Likely viral in nature. Advised pt to try mucinex as needed and to call if new/worsening symptoms or if symptoms fail to improve in the next few days.

## 2018-11-18 NOTE — Patient Instructions (Addendum)
Try adding mucinex twice daily for chest congestion.  Please call the Lipid clinic to schedule a follow up appointment. [phone]  Complete lab work prior to leaving.

## 2018-11-18 NOTE — Telephone Encounter (Signed)
Could you please call Dr. Leonie Douglas Li's office to request copy of colonoscopy?

## 2018-11-19 LAB — COMPREHENSIVE METABOLIC PANEL
ALT: 29 U/L (ref 0–35)
AST: 23 U/L (ref 0–37)
Albumin: 4.3 g/dL (ref 3.5–5.2)
Alkaline Phosphatase: 74 U/L (ref 39–117)
BILIRUBIN TOTAL: 0.2 mg/dL (ref 0.2–1.2)
BUN: 18 mg/dL (ref 6–23)
CO2: 24 meq/L (ref 19–32)
CREATININE: 0.93 mg/dL (ref 0.40–1.20)
Calcium: 9.5 mg/dL (ref 8.4–10.5)
Chloride: 107 mEq/L (ref 96–112)
GFR: 74.86 mL/min (ref >60.00)
Glucose, Bld: 91 mg/dL (ref 70–99)
Potassium: 4.2 mEq/L (ref 3.5–5.1)
Sodium: 141 mEq/L (ref 135–145)
Total Protein: 7.1 g/dL (ref 6.0–8.3)

## 2018-11-19 NOTE — Telephone Encounter (Signed)
Request faxed to Dr. Tonna Boehringer at [phone]

## 2018-11-20 ENCOUNTER — Encounter: Payer: Self-pay | Admitting: Family

## 2018-11-21 NOTE — Progress Notes (Signed)
Sent to patitnt

## 2018-11-26 ENCOUNTER — Telehealth: Payer: Self-pay | Admitting: Cardiovascular Disease

## 2018-11-26 DIAGNOSIS — E782 Mixed hyperlipidemia: Secondary | ICD-10-CM

## 2018-11-26 NOTE — Telephone Encounter (Signed)
°*  STAT* If patient is at the pharmacy, call can be transferred to refill team.   1. Which medications need to be refilled? (please list name of each medication and dose if known)  Alirocumab (PRALUENT) 150 MG/ML SOPN  2. Which pharmacy/location (including street and city if local pharmacy) is medication to be sent to? [place], [place]  3. Do they need a 30 day or 90 day supply? 30   Pharmacy has left pt 3 messages to get delivery of next medication scheduled. Pt has not returned any of the Pharmacy's messages

## 2018-11-26 NOTE — Telephone Encounter (Signed)
Called pt - she states she thinks she is taking too much Praluent because she is having some trouble sleeping and having some headaches. Advised pt this should not be from her Praluent. She still wishes to decrease her dose. She does agree to come in for fasting labs next week because she would like to see how her cholesterol is looking. She is agreeable to continuing on the 150mg  dose until then and we can reevaluate at that time if the lower dose of Praluent will still keep her LDL at goal.

## 2018-12-03 ENCOUNTER — Other Ambulatory Visit: Payer: BLUE CROSS/BLUE SHIELD | Admitting: *Deleted

## 2018-12-03 DIAGNOSIS — E782 Mixed hyperlipidemia: Secondary | ICD-10-CM

## 2018-12-03 LAB — LIPID PANEL
CHOLESTEROL TOTAL: 152 mg/dL (ref 100–199)
Chol/HDL Ratio: 3.3 ratio (ref 0.0–4.4)
HDL: 46 mg/dL (ref >39)
LDL Calculated: 70 mg/dL (ref 0–99)
Triglycerides: 178 mg/dL — ABNORMAL HIGH (ref 0–149)
VLDL CHOLESTEROL CAL: 36 mg/dL (ref 5–40)

## 2018-12-03 LAB — LDL CHOLESTEROL, DIRECT: LDL DIRECT: 76 mg/dL (ref 0–99)

## 2019-02-21 ENCOUNTER — Encounter: Payer: BLUE CROSS/BLUE SHIELD | Admitting: Family

## 2019-03-21 ENCOUNTER — Telehealth: Payer: Self-pay

## 2019-03-21 NOTE — Telephone Encounter (Signed)
I called and left the pt a message stating to call us back w/drug insurance information so that we may proceed w/ the praluent pa

## 2019-05-06 NOTE — Telephone Encounter (Signed)
Attempted to call pt today (05/06/19) however was not able to reach her so left a VM.   Also, called pt's pharmacy ([facility] in [place]). Informed by pharmacist that pt has not received medications through [place] since 10/2018. However, was able to obtain last prescription drug insurance card pt used which is listed as follows.... BIN: [id number] PCN: [id number] Group: [id number] ID: [id number] This insurance card has never been uploaded in her file.  Attempted to submit PA through covermymeds, however, received a rejection message due to how pt was deemed inactive.   Will await pt's call back with updated insurance info.

## 2019-05-09 ENCOUNTER — Telehealth: Payer: Self-pay

## 2019-05-09 NOTE — Telephone Encounter (Signed)
LMOMED THE PT STATED THAT WE NEED TO FIND OUT THE UPDATED INSURANCE INFO

## 2019-05-13 ENCOUNTER — Encounter: Payer: Self-pay | Admitting: Family

## 2019-05-13 DIAGNOSIS — Z0289 Encounter for other administrative examinations: Secondary | ICD-10-CM

## 2019-05-19 ENCOUNTER — Telehealth: Payer: Self-pay

## 2019-05-19 NOTE — Telephone Encounter (Signed)
Called and lmomed pt regarding outdated insurance information and awaiting callback

## 2019-10-22 ENCOUNTER — Telehealth: Payer: Self-pay | Admitting: Pharmacist

## 2019-10-22 NOTE — Telephone Encounter (Signed)
Called pt again to f/u with insurance. She states she lost insurance and has been off the Praluent for a while. Advised that we could submit patient assistance paperwork for her to receive free medication through the pharmaceutical company. She states she doesn't want to take Praluent anymore because it gave her muscle aches. Med list updated. She stated she would call back later if she gets new insurance or wants to start new medication.

## 2020-01-05 ENCOUNTER — Telehealth: Payer: Self-pay | Admitting: Family

## 2020-01-05 NOTE — Telephone Encounter (Signed)
Lvm for patient to call back

## 2020-01-05 NOTE — Telephone Encounter (Signed)
Caller : Kaitlyn Bowman  Call Back # [phone]   Patient would like [name] to advise whether or not she should take Covid Vaccine due to Blood Thinner she takes.

## 2020-01-05 NOTE — Telephone Encounter (Signed)
Patient reports she is on Aspirin, patient advised this is not a contraindication for immunizations.

## 2020-06-01 ENCOUNTER — Encounter: Payer: Self-pay | Admitting: Family

## 2020-06-01 ENCOUNTER — Telehealth: Payer: Self-pay | Admitting: Family

## 2020-06-01 ENCOUNTER — Other Ambulatory Visit: Payer: Self-pay

## 2020-06-01 ENCOUNTER — Ambulatory Visit (INDEPENDENT_AMBULATORY_CARE_PROVIDER_SITE_OTHER): Payer: PRIVATE HEALTH INSURANCE | Admitting: Family

## 2020-06-01 VITALS — BP 157/79 | HR 60 | Temp 98.0°F | Resp 16 | Ht 66.0 in | Wt 221.0 lb

## 2020-06-01 DIAGNOSIS — R030 Elevated blood-pressure reading, without diagnosis of hypertension: Secondary | ICD-10-CM

## 2020-06-01 DIAGNOSIS — Z114 Encounter for screening for human immunodeficiency virus [HIV]: Secondary | ICD-10-CM

## 2020-06-01 DIAGNOSIS — Z1159 Encounter for screening for other viral diseases: Secondary | ICD-10-CM

## 2020-06-01 DIAGNOSIS — E785 Hyperlipidemia, unspecified: Secondary | ICD-10-CM

## 2020-06-01 DIAGNOSIS — Z0001 Encounter for general adult medical examination with abnormal findings: Secondary | ICD-10-CM

## 2020-06-01 DIAGNOSIS — Z0000 Encounter for general adult medical examination without abnormal findings: Secondary | ICD-10-CM

## 2020-06-01 DIAGNOSIS — E348 Other specified endocrine disorders: Secondary | ICD-10-CM

## 2020-06-01 NOTE — Progress Notes (Signed)
Subjective:    Patient ID: Kaitlyn Bowman, female    DOB: [birth year], 60 y.o.   MRN: [id number]  HPI  Patient is a 59 yr old female who presents today for her annual physical.  Immunizations: tdap 2013,  She completed pfizer. Declines shingrix.  Diet:  Reports that she has been working on her diet Wt Readings from Last 3 Encounters:  06/01/20 221 lb (100.2 kg)  11/18/18 205 lb (93 kg)  10/25/18 207 lb 3.2 oz (94 kg)  Exercise: reports that doing some walking.  Colonoscopy: 01/29/17 Dexa: due Pap Smear:  hysterectomy Mammogram: 2018  Vision: up to date Dental: up to date     Review of Systems  Constitutional: Positive for unexpected weight change.  HENT: Negative for hearing loss and rhinorrhea.   Eyes: Negative for visual disturbance.  Respiratory: Negative for cough.   Cardiovascular: Negative for chest pain.  Gastrointestinal: Positive for constipation. Negative for diarrhea.  Genitourinary: Negative for dysuria and frequency.  Musculoskeletal: Negative for arthralgias (mild wrist pain, bilateral knee pain).  Skin: Negative for rash.  Neurological: Negative for headaches.  Hematological: Negative for adenopathy.  Psychiatric/Behavioral:       Denies depression/anxiety   Past Medical History:  Diagnosis Date  . Abnormal TSH   . Alcohol abuse   . Cocaine abuse ([place])    Remote crack cocaine use. UDS negative for cocaine 01/2012  . Coronary artery disease     inferior STEMI 01/13/12 s/p PTCA/DES to mid RCA. Initial EF 45% by cath but improved to 55-60% by echo 01/14/12  . Dyslipidemia    Trig 201, HDL 36, LDL 197 01/2012  . GERD (gastroesophageal reflux disease)   . History of MI (myocardial infarction) 01/2012  . Hypothyroidism   . Myocardial infarction ([place]) 2012  . Umbilical hernia      Social History   Socioeconomic History  . Marital status: Single    Spouse name: Not on file  . Number of children: 1  . Years of education: Not on file  . Highest education  level: Not on file  Occupational History  . Occupation: Best boy: [employer]  Tobacco Use  . Smoking status: Former Smoker    Years: 33.00    Types: Cigarettes    Quit date: 06/13/2008    Years since quitting: 11.9  . Smokeless tobacco: Never Used  . Tobacco comment: None since 2009   Substance and Sexual Activity  . Alcohol use: No    Comment: former  . Drug use: No    Types: "Crack" cocaine    Comment: Crack cocaine; none since Jan 2006   . Sexual activity: Not on file  Other Topics Concern  . 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   Enjoys reading, sewing, Fill in puzzles, walking   Completed GED.   No pets.               Social Determinants of Health   Financial Resource Strain:   . Difficulty of Paying Living Expenses: Not on file  Food Insecurity:   . Worried About Charity fundraiser in the Last Year: Not on file  . Ran Out of Food in the Last Year: Not on  file  Transportation Needs:   . Film/video editor (Medical): Not on file  . Lack of Transportation (Non-Medical): Not on file  Physical Activity:   . Days of Exercise per Week: Not on file  . Minutes of Exercise per Session: Not on file  Stress:   . Feeling of Stress : Not on file  Social Connections:   . Frequency of Communication with Friends and Family: Not on file  . Frequency of Social Gatherings with Friends and Family: Not on file  . Attends Religious Services: Not on file  . Active Member of Clubs or Organizations: Not on file  . Attends Archivist Meetings: Not on file  . Marital Status: Not on file  Intimate Partner Violence:   . Fear of Current or Ex-Partner: Not on file  . Emotionally Abused: Not on file  . Physically Abused: Not on file  . Sexually Abused: Not on file    Past Surgical  History:  Procedure Laterality Date  . ABDOMINAL HYSTERECTOMY  1981   partial  . CARDIAC CATHETERIZATION  2010, 2013   Negative. 2013--100% blockage  . HERNIA REPAIR  2017   abd hernia   . LEFT HEART CATHETERIZATION WITH CORONARY ANGIOGRAM N/A 01/12/2012   Procedure: LEFT HEART CATHETERIZATION WITH CORONARY ANGIOGRAM;  Surgeon: Burnell Blanks, MD;  Location: [facility] CATH LAB;  Service: Cardiovascular;  Laterality: N/A;    Family History  Problem Relation Age of Onset  . Heart attack Mother 30       prior pacemaker  . Heart disease Mother   . Diabetes Mother   . Heart failure Father        CHF  . Heart disease Father   . Alcoholism Father   . Stroke Brother     Allergies  Allergen Reactions  . Ciprofloxacin     REACTION: nause-sweating-chest tightness  . Atorvastatin     Myalgia   . Crestor [Rosuvastatin Calcium] Nausea Only  . Livalo [Pitavastatin]     myalgia  . Praluent [Alirocumab]     Muscle aches    Current Outpatient Medications on File Prior to Visit  Medication Sig Dispense Refill  . aspirin EC 81 MG tablet Take 1 tablet (81 mg total) by mouth daily. 90 tablet 3  . fluticasone (FLONASE) 50 MCG/ACT nasal spray Place 2 sprays into both nostrils daily. 16 g 1  . nitroGLYCERIN (NITROSTAT) 0.4 MG SL tablet Place 1 tablet (0.4 mg total) under the tongue every 5 (five) minutes x 3 doses as needed for chest pain. 25 tablet 6  . sodium chloride (OCEAN) 0.65 % SOLN nasal spray Place 1 spray into both nostrils as needed for congestion. 15 mL 0   No current facility-administered medications on file prior to visit.    BP (!) 157/79 (BP Location: Right Arm, Patient Position: Sitting, Cuff Size: Large)   Pulse 60   Temp 98 F (36.7 C) (Oral)   Resp 16   Ht 5\' 6"  (1.676 m)   Wt 221 lb (100.2 kg)   SpO2 100%   BMI 35.67 kg/m       Objective:   Physical Exam  Physical Exam  Constitutional: She is oriented to person, place, and time. She appears well-developed  and well-nourished. No distress.  HENT:  Head: Normocephalic and atraumatic.  Right Ear: Tympanic membrane and ear canal normal.  Left Ear: Tympanic membrane and ear canal normal.  Mouth/Throat: Not examined- pt wearing mask Eyes: Pupils are equal, round, and  reactive to light. No scleral icterus.  Neck: Normal range of motion. No thyromegaly present.  Cardiovascular: Normal rate and regular rhythm.   No murmur heard. Pulmonary/Chest: Effort normal and breath sounds normal. No respiratory distress. He has no wheezes. She has no rales. She exhibits no tenderness.  Abdominal: Soft. Bowel sounds are normal. She exhibits no distension and no mass. There is no tenderness. There is no rebound and no guarding.  Musculoskeletal: She exhibits no edema.  Lymphadenopathy:    She has no cervical adenopathy.  Neurological: She is alert and oriented to person, place, and time. She has normal patellar reflexes. She exhibits normal muscle tone. Coordination normal.  Skin: Skin is warm and dry.  Psychiatric: She has a normal mood and affect. Her behavior is normal. Judgment and thought content normal.        Assessment & Plan:        Assessment & Plan:    Elevated blood pressure reading- she has had some weight gain. We discussed diet/exercise/weight loss. Plan to bring her back in 2 weeks. If still elevated will need to start antihypertensive.  BP Readings from Last 3 Encounters:  06/01/20 (!) 157/79  11/18/18 (!) 132/59  10/25/18 133/65   Preventative care- declines shingrix. Refer for mammo/dexa, obtain hiv and hep c screening.    Hyperlipidemia- she states she does not wish to restart praluent.  She plans to discuss further with cardiology at her upcoming visit.

## 2020-06-01 NOTE — Patient Instructions (Signed)
Please complete lab work prior to leaving.   

## 2020-06-01 NOTE — Telephone Encounter (Signed)
Kaitlyn Bowman gave me a copy of her vaccination card. Patient received pfizer 1st dosage was on 01/08/20 and the 2nd dose on 02/02/20

## 2020-06-02 LAB — LIPID PANEL
Cholesterol: 274 mg/dL — ABNORMAL HIGH (ref <200)
HDL: 37 mg/dL — ABNORMAL LOW (ref >=50)
LDL Cholesterol (Calc): 187 mg/dL (calc) — ABNORMAL HIGH
Non-HDL Cholesterol (Calc): 237 mg/dL (calc) — ABNORMAL HIGH (ref <130)
Total CHOL/HDL Ratio: 7.4 (calc) — ABNORMAL HIGH (ref <5.0)
Triglycerides: 282 mg/dL — ABNORMAL HIGH (ref <150)

## 2020-06-02 LAB — CBC WITH DIFFERENTIAL/PLATELET
Absolute Monocytes: 557 cells/uL (ref 200–950)
Basophils Absolute: 52 cells/uL (ref 0–200)
Basophils Relative: 0.6 %
Eosinophils Absolute: 139 cells/uL (ref 15–500)
Eosinophils Relative: 1.6 %
HCT: 40.3 % (ref 35.0–45.0)
Hemoglobin: 13 g/dL (ref 11.7–15.5)
Lymphs Abs: 4298 cells/uL — ABNORMAL HIGH (ref 850–3900)
MCH: 25.7 pg — ABNORMAL LOW (ref 27.0–33.0)
MCHC: 32.3 g/dL (ref 32.0–36.0)
MCV: 79.6 fL — ABNORMAL LOW (ref 80.0–100.0)
MPV: 11.6 fL (ref 7.5–12.5)
Monocytes Relative: 6.4 %
Neutro Abs: 3654 cells/uL (ref 1500–7800)
Neutrophils Relative %: 42 %
Platelets: 310 10*3/uL (ref 140–400)
RBC: 5.06 10*6/uL (ref 3.80–5.10)
RDW: 14.8 % (ref 11.0–15.0)
Total Lymphocyte: 49.4 %
WBC: 8.7 10*3/uL (ref 3.8–10.8)

## 2020-06-02 LAB — HEPATIC FUNCTION PANEL
AG Ratio: 1.2 (calc) (ref 1.0–2.5)
ALT: 22 U/L (ref 6–29)
AST: 19 U/L (ref 10–35)
Albumin: 4.2 g/dL (ref 3.6–5.1)
Alkaline phosphatase (APISO): 80 U/L (ref 37–153)
Bilirubin, Direct: 0.1 mg/dL (ref 0.0–0.2)
Globulin: 3.4 g/dL (calc) (ref 1.9–3.7)
Indirect Bilirubin: 0.2 mg/dL (calc) (ref 0.2–1.2)
Total Bilirubin: 0.3 mg/dL (ref 0.2–1.2)
Total Protein: 7.6 g/dL (ref 6.1–8.1)

## 2020-06-02 LAB — BASIC METABOLIC PANEL
BUN: 11 mg/dL (ref 7–25)
CO2: 22 mmol/L (ref 20–32)
Calcium: 9.5 mg/dL (ref 8.6–10.4)
Chloride: 105 mmol/L (ref 98–110)
Creat: 0.74 mg/dL (ref 0.50–1.05)
Glucose, Bld: 69 mg/dL (ref 65–99)
Potassium: 4.4 mmol/L (ref 3.5–5.3)
Sodium: 139 mmol/L (ref 135–146)

## 2020-06-02 LAB — HEPATITIS C ANTIBODY
Hepatitis C Ab: NONREACTIVE
SIGNAL TO CUT-OFF: 0.01 (ref <1.00)

## 2020-06-02 LAB — HIV ANTIBODY (ROUTINE TESTING W REFLEX): HIV 1&2 Ab, 4th Generation: NONREACTIVE

## 2020-06-02 LAB — TSH: TSH: 3.87 mIU/L (ref 0.40–4.50)

## 2020-06-02 NOTE — Telephone Encounter (Signed)
Health information updated.

## 2020-06-15 ENCOUNTER — Ambulatory Visit (HOSPITAL_BASED_OUTPATIENT_CLINIC_OR_DEPARTMENT_OTHER)
Admission: RE | Admit: 2020-06-15 | Discharge: 2020-06-15 | Disposition: A | Discharge status: Home / Self Care | Payer: No Typology Code available for payment source | Source: Ambulatory Visit | Attending: Family | Admitting: Family

## 2020-06-15 ENCOUNTER — Encounter: Payer: Self-pay | Admitting: Family

## 2020-06-15 ENCOUNTER — Other Ambulatory Visit: Payer: Self-pay

## 2020-06-15 ENCOUNTER — Ambulatory Visit (INDEPENDENT_AMBULATORY_CARE_PROVIDER_SITE_OTHER): Payer: PRIVATE HEALTH INSURANCE | Admitting: Family

## 2020-06-15 ENCOUNTER — Encounter (HOSPITAL_BASED_OUTPATIENT_CLINIC_OR_DEPARTMENT_OTHER): Payer: Self-pay

## 2020-06-15 VITALS — BP 148/71 | HR 64 | Temp 98.0°F | Resp 16 | Ht 66.0 in | Wt 219.0 lb

## 2020-06-15 DIAGNOSIS — E348 Other specified endocrine disorders: Secondary | ICD-10-CM | POA: Diagnosis present

## 2020-06-15 DIAGNOSIS — M25562 Pain in left knee: Secondary | ICD-10-CM | POA: Diagnosis not present

## 2020-06-15 DIAGNOSIS — M25561 Pain in right knee: Secondary | ICD-10-CM

## 2020-06-15 DIAGNOSIS — M8580 Other specified disorders of bone density and structure, unspecified site: Secondary | ICD-10-CM | POA: Diagnosis not present

## 2020-06-15 DIAGNOSIS — I10 Essential (primary) hypertension: Secondary | ICD-10-CM | POA: Insufficient documentation

## 2020-06-15 DIAGNOSIS — Z0000 Encounter for general adult medical examination without abnormal findings: Secondary | ICD-10-CM | POA: Diagnosis present

## 2020-06-15 NOTE — Patient Instructions (Signed)
You can try voltaren gel over the counter for knee pain.

## 2020-06-15 NOTE — Progress Notes (Signed)
Subjective:    Patient ID: Kaitlyn Bowman, female    DOB: December 06, 1960, 59 y.o.   MRN: [id number]  HPI  Patient is a 59 yr old female who presents today for follow up of her blood pressure.  She reports that she was really stressed last visit which is why her blood pressure was elevated.  BP Readings from Last 3 Encounters:  06/15/20 (!) 148/71  06/01/20 (!) 157/79  11/18/18 (!) 132/59   C/o bilateral knee pain L>R. Has seen sports medicine in the past and received steroid injections. She is requesting to return to sports medicine.  She completed bone density today and is requesting results.    Review of Systems Past Medical History:  Diagnosis Date  . Abnormal TSH   . Alcohol abuse   . Cocaine abuse ([place])    Remote crack cocaine use. UDS negative for cocaine 01/2012  . Coronary artery disease     inferior STEMI 01/13/12 s/p PTCA/DES to mid RCA. Initial EF 45% by cath but improved to 55-60% by echo 01/14/12  . Dyslipidemia    Trig 201, HDL 36, LDL 197 01/2012  . GERD (gastroesophageal reflux disease)   . History of MI (myocardial infarction) 01/2012  . Hypothyroidism   . Myocardial infarction ([place]) 2012  . Umbilical hernia      Social History   Socioeconomic History  . Marital status: Single    Spouse name: Not on file  . Number of children: 1  . Years of education: Not on file  . Highest education level: Not on file  Occupational History  . Occupation: Best boy: [employer]  Tobacco Use  . Smoking status: Former Smoker    Years: 33.00    Types: Cigarettes    Quit date: 06/13/2008    Years since quitting: 12.0  . Smokeless tobacco: Never Used  . Tobacco comment: None since 2009   Substance and Sexual Activity  . Alcohol use: No    Comment: former  . Drug use: No    Types: "Crack" cocaine    Comment: Crack cocaine; none since Jan 2006   . Sexual activity: Not on file  Other Topics Concern  . 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   Enjoys reading, sewing, Fill in puzzles, walking   Completed GED.   No pets.               Social Determinants of Health   Financial Resource Strain:   . Difficulty of Paying Living Expenses: Not on file  Food Insecurity:   . Worried About Charity fundraiser in the Last Year: Not on file  . Ran Out of Food in the Last Year: Not on file  Transportation Needs:   . Lack of Transportation (Medical): Not on file  . Lack of Transportation (Non-Medical): Not on file  Physical Activity:   . Days of Exercise per Week: Not on file  . Minutes of Exercise per Session: Not on file  Stress:   . Feeling of Stress : Not on file  Social Connections:   . Frequency of Communication with Friends and Family: Not on file  . Frequency of Social Gatherings with Friends and Family: Not on  file  . Attends Religious Services: Not on file  . Active Member of Clubs or Organizations: Not on file  . Attends Archivist Meetings: Not on file  . Marital Status: Not on file  Intimate Partner Violence:   . Fear of Current or Ex-Partner: Not on file  . Emotionally Abused: Not on file  . Physically Abused: Not on file  . Sexually Abused: Not on file    Past Surgical History:  Procedure Laterality Date  . ABDOMINAL HYSTERECTOMY  1981   partial  . CARDIAC CATHETERIZATION  2010, 2013   Negative. 2013--100% blockage  . HERNIA REPAIR  2017   abd hernia   . LEFT HEART CATHETERIZATION WITH CORONARY ANGIOGRAM N/A 01/12/2012   Procedure: LEFT HEART CATHETERIZATION WITH CORONARY ANGIOGRAM;  Surgeon: Burnell Blanks, MD;  Location: [facility] CATH LAB;  Service: Cardiovascular;  Laterality: N/A;    Family History  Problem Relation Age of Onset  . Heart attack Mother 51       prior pacemaker  . Heart disease Mother   . Diabetes Mother   . Heart  failure Father        CHF  . Heart disease Father   . Alcoholism Father   . Stroke Brother     Allergies  Allergen Reactions  . Ciprofloxacin     REACTION: nause-sweating-chest tightness  . Atorvastatin     Myalgia   . Crestor [Rosuvastatin Calcium] Nausea Only  . Livalo [Pitavastatin]     myalgia  . Praluent [Alirocumab]     Muscle aches    Current Outpatient Medications on File Prior to Visit  Medication Sig Dispense Refill  . aspirin EC 81 MG tablet Take 1 tablet (81 mg total) by mouth daily. 90 tablet 3  . fluticasone (FLONASE) 50 MCG/ACT nasal spray Place 2 sprays into both nostrils daily. 16 g 1  . nitroGLYCERIN (NITROSTAT) 0.4 MG SL tablet Place 1 tablet (0.4 mg total) under the tongue every 5 (five) minutes x 3 doses as needed for chest pain. 25 tablet 6  . sodium chloride (OCEAN) 0.65 % SOLN nasal spray Place 1 spray into both nostrils as needed for congestion. 15 mL 0   No current facility-administered medications on file prior to visit.    BP (!) 148/71 (BP Location: Left Arm, Patient Position: Sitting, Cuff Size: Small)   Pulse 64   Temp 98 F (36.7 C) (Oral)   Resp 16   Ht 5\' 6"  (1.676 m)   Wt 219 lb (99.3 kg)   SpO2 100%   BMI 35.35 kg/m       Objective:   Physical Exam Constitutional:      Appearance: She is well-developed.  Neck:     Thyroid: No thyromegaly.  Cardiovascular:     Rate and Rhythm: Normal rate and regular rhythm.     Heart sounds: Normal heart sounds. No murmur heard.   Pulmonary:     Effort: Pulmonary effort is normal. No respiratory distress.     Breath sounds: Normal breath sounds. No wheezing.  Musculoskeletal:     Cervical back: Neck supple.  Skin:    General: Skin is warm and dry.  Neurological:     Mental Status: She is alert and oriented to person, place, and time.  Psychiatric:        Behavior: Behavior normal.        Thought Content: Thought content normal.        Judgment: Judgment normal.  Assessment & Plan:  HTN- follow up bp is better today. She really hopes to avoid medication. She does have follow up scheduled with cardiology. Will defer to cardiology if they wish to add antihypertensive such as beta blocker.  Bilateral knee pain- requesting referral to sports medicine. Recommended topical voltaren gel PRN.   Osteopenia- mild, noted on bone density. Pt counseled on the importance of weight bearing exercise such as walking and adequate calcium intake.  This visit occurred during the SARS-CoV-2 public health emergency.  Safety protocols were in place, including screening questions prior to the visit, additional usage of staff PPE, and extensive cleaning of exam room while observing appropriate contact time as indicated for disinfecting solutions.

## 2020-06-22 ENCOUNTER — Other Ambulatory Visit: Payer: Self-pay

## 2020-06-22 ENCOUNTER — Encounter: Payer: Self-pay | Admitting: Family Medicine

## 2020-06-22 ENCOUNTER — Ambulatory Visit (HOSPITAL_BASED_OUTPATIENT_CLINIC_OR_DEPARTMENT_OTHER)
Admission: RE | Admit: 2020-06-22 | Discharge: 2020-06-22 | Disposition: A | Discharge status: Home / Self Care | Payer: PRIVATE HEALTH INSURANCE | Source: Ambulatory Visit | Attending: Family Medicine | Admitting: Family Medicine

## 2020-06-22 ENCOUNTER — Ambulatory Visit (INDEPENDENT_AMBULATORY_CARE_PROVIDER_SITE_OTHER): Payer: PRIVATE HEALTH INSURANCE | Admitting: Family Medicine

## 2020-06-22 ENCOUNTER — Ambulatory Visit: Payer: Self-pay

## 2020-06-22 VITALS — Ht 67.0 in | Wt 219.0 lb

## 2020-06-22 DIAGNOSIS — M170 Bilateral primary osteoarthritis of knee: Secondary | ICD-10-CM

## 2020-06-22 MED ORDER — TRIAMCINOLONE ACETONIDE 40 MG/ML IJ SUSP
40.0000 mg | Freq: Once | INTRAMUSCULAR | Status: AC
  Administered 2020-06-22: 40 mg via INTRA_ARTICULAR

## 2020-06-22 NOTE — Assessment & Plan Note (Signed)
Acute on chronic in nature.  Has acutely gotten worse as of late.  No inciting event or trauma.  Does have degenerative changes. -Counseled on home exercise therapy and supportive care. -X-rays. -Injections. -Could consider gel injections or physical therapy.

## 2020-06-22 NOTE — Progress Notes (Signed)
Kaitlyn Bowman - 59 y.o. female MRN [id number]  Date of birth: [birth year]  SUBJECTIVE:  Including CC & ROS.  No chief complaint on file.   Kaitlyn Bowman is a 59 y.o. female that is presenting with acute on chronic bilateral knee pain.  She has received steroid injections in the past to improve her symptoms.  No inciting event or trauma.  Seems to be worse the more she is on her knees.  Does have pain at night.  No history of surgery.   Review of Systems See HPI   HISTORY: Past Medical, Surgical, Social, and Family History Reviewed & Updated per EMR.   Pertinent Historical Findings include:  Past Medical History:  Diagnosis Date  . Abnormal TSH   . Alcohol abuse   . Cocaine abuse ([place])    Remote crack cocaine use. UDS negative for cocaine 01/2012  . Coronary artery disease     inferior STEMI 01/13/12 s/p PTCA/DES to mid RCA. Initial EF 45% by cath but improved to 55-60% by echo 01/14/12  . Dyslipidemia    Trig 201, HDL 36, LDL 197 01/2012  . GERD (gastroesophageal reflux disease)   . History of MI (myocardial infarction) 01/2012  . Hypothyroidism   . Myocardial infarction ([place]) 2012  . Umbilical hernia     Past Surgical History:  Procedure Laterality Date  . ABDOMINAL HYSTERECTOMY  1981   partial  . CARDIAC CATHETERIZATION  2010, 2013   Negative. 2013--100% blockage  . HERNIA REPAIR  2017   abd hernia   . LEFT HEART CATHETERIZATION WITH CORONARY ANGIOGRAM N/A 01/12/2012   Procedure: LEFT HEART CATHETERIZATION WITH CORONARY ANGIOGRAM;  Surgeon: Burnell Blanks, MD;  Location: [facility] CATH LAB;  Service: Cardiovascular;  Laterality: N/A;    Family History  Problem Relation Age of Onset  . Heart attack Mother 81       prior pacemaker  . Heart disease Mother   . Diabetes Mother   . Heart failure Father        CHF  . Heart disease Father   . Alcoholism Father   . Stroke Brother     Social History   Socioeconomic History  . Marital status: Single    Spouse name: Not on  file  . Number of children: 1  . Years of education: Not on file  . Highest education level: Not on file  Occupational History  . Occupation: Best boy: [employer]  Tobacco Use  . Smoking status: Former Smoker    Years: 33.00    Types: Cigarettes    Quit date: 06/13/2008    Years since quitting: 12.0  . Smokeless tobacco: Never Used  . Tobacco comment: None since 2009   Substance and Sexual Activity  . Alcohol use: No    Comment: former  . Drug use: No    Types: "Crack" cocaine    Comment: Crack cocaine; none since Jan 2006   . Sexual activity: Not on file  Other Topics Concern  . 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   Enjoys reading, sewing, Fill in puzzles, walking   Completed GED.   No pets.  Social Determinants of Health   Financial Resource Strain:   . Difficulty of Paying Living Expenses: Not on file  Food Insecurity:   . Worried About Charity fundraiser in the Last Year: Not on file  . Ran Out of Food in the Last Year: Not on file  Transportation Needs:   . Lack of Transportation (Medical): Not on file  . Lack of Transportation (Non-Medical): Not on file  Physical Activity:   . Days of Exercise per Week: Not on file  . Minutes of Exercise per Session: Not on file  Stress:   . Feeling of Stress : Not on file  Social Connections:   . Frequency of Communication with Friends and Family: Not on file  . Frequency of Social Gatherings with Friends and Family: Not on file  . Attends Religious Services: Not on file  . Active Member of Clubs or Organizations: Not on file  . Attends Archivist Meetings: Not on file  . Marital Status: Not on file  Intimate Partner Violence:   . Fear of Current or Ex-Partner: Not on file  . Emotionally Abused: Not on  file  . Physically Abused: Not on file  . Sexually Abused: Not on file     PHYSICAL EXAM:  VS: There were no vitals taken for this visit. Physical Exam Gen: NAD, alert, cooperative with exam, well-appearing MSK:  Right and left knee: No obvious effusion. Tenderness palpation over the medial joint space. Normal strength resistance. No instability. Neurovascular intact   Aspiration/Injection Procedure Note Kaitlyn Bowman Feb 07, 1961  Procedure: Injection Indications: Right knee pain  Procedure Details Consent: Risks of procedure as well as the alternatives and risks of each were explained to the (patient/caregiver).  Consent for procedure obtained. Time Out: Verified patient identification, verified procedure, site/side was marked, verified correct patient position, special equipment/implants available, medications/allergies/relevent history reviewed, required imaging and test results available.  Performed.  The area was cleaned with iodine and alcohol swabs.    The Right knee superior lateral suprapatellar pouch was injected using 1 cc's of 40 mg Kenalog and 4 cc's of 0.25% bupivacaine with a 22 1 1/2" needle.  Ultrasound was used. Images were obtained in long views showing the injection.     A sterile dressing was applied.  Patient did tolerate procedure well.   Aspiration/Injection Procedure Note Kaitlyn Bowman Mar 10, 1961  Procedure: Injection Indications: Left knee pain  Procedure Details Consent: Risks of procedure as well as the alternatives and risks of each were explained to the (patient/caregiver).  Consent for procedure obtained. Time Out: Verified patient identification, verified procedure, site/side was marked, verified correct patient position, special equipment/implants available, medications/allergies/relevent history reviewed, required imaging and test results available.  Performed.  The area was cleaned with iodine and alcohol swabs.    The left knee  superior lateral suprapatellar pouch was injected using 1 cc's of 40 mg Kenalog and 4 cc's of 0.25% bupivacaine with a 22 1 1/2" needle.  Ultrasound was used. Images were obtained in long views showing the injection.     A sterile dressing was applied.  Patient did tolerate procedure well.     ASSESSMENT & PLAN:   OA (osteoarthritis) of knee Acute on chronic in nature.  Has acutely gotten worse as of late.  No inciting event or trauma.  Does have degenerative changes. -Counseled on home exercise therapy and supportive care. -X-rays. -Injections. -Could consider gel injections or physical therapy.

## 2020-06-22 NOTE — Addendum Note (Signed)
Addended by: Sherrie George F on: 06/22/2020 03:55 PM   Modules accepted: Orders

## 2020-06-22 NOTE — Patient Instructions (Signed)
Nice to meet you Please try ice  Please try the exercises  I will call with the results from today  Please send me a message in MyChart with any questions or updates.  Please see me back in 4 weeks.   --Dr. Raeford Razor

## 2020-06-24 ENCOUNTER — Telehealth: Payer: Self-pay | Admitting: Family Medicine

## 2020-06-24 NOTE — Telephone Encounter (Signed)
Informed of results.   Rosemarie Ax, MD [facility] 06/24/2020, 9:09 AM

## 2020-06-29 ENCOUNTER — Telehealth: Payer: Self-pay | Admitting: Family

## 2020-06-29 DIAGNOSIS — K439 Ventral hernia without obstruction or gangrene: Secondary | ICD-10-CM

## 2020-06-29 NOTE — Telephone Encounter (Signed)
Referral has been placed. 

## 2020-06-29 NOTE — Telephone Encounter (Signed)
Caller : Kaitlyn Bowman  Call Back # [phone]  Patient states she would like a referral to [facility] for past hernia. She would like to make should everything is ok. Patient states recently got insurance back and it has been  A while sent seeing their office. Patient states she discuss in last visit with [name] but did not mention a referral to [name].

## 2020-07-05 ENCOUNTER — Ambulatory Visit: Payer: PRIVATE HEALTH INSURANCE | Admitting: Family

## 2020-07-05 DIAGNOSIS — Z0289 Encounter for other administrative examinations: Secondary | ICD-10-CM

## 2020-07-06 ENCOUNTER — Encounter: Payer: Self-pay | Admitting: Family

## 2020-07-13 ENCOUNTER — Other Ambulatory Visit: Payer: Self-pay | Admitting: Family

## 2020-07-13 ENCOUNTER — Other Ambulatory Visit: Payer: Self-pay

## 2020-07-13 ENCOUNTER — Ambulatory Visit (INDEPENDENT_AMBULATORY_CARE_PROVIDER_SITE_OTHER): Payer: PRIVATE HEALTH INSURANCE | Admitting: Family

## 2020-07-13 VITALS — BP 132/55 | HR 68 | Temp 98.2°F | Resp 16 | Wt 218.0 lb

## 2020-07-13 DIAGNOSIS — T148XXA Other injury of unspecified body region, initial encounter: Secondary | ICD-10-CM

## 2020-07-13 DIAGNOSIS — Z7185 Encounter for immunization safety counseling: Secondary | ICD-10-CM

## 2020-07-13 MED ORDER — MELOXICAM 7.5 MG PO TABS: 7.5000 mg | ORAL_TABLET | Freq: Every day | ORAL | 0 refills | Status: DC

## 2020-07-13 MED ORDER — CYCLOBENZAPRINE HCL 5 MG PO TABS: 5.0000 mg | ORAL_TABLET | Freq: Every evening | ORAL | 0 refills | Status: DC | PRN

## 2020-07-13 MED FILL — CYCLOBENZAPRINE HCL 5 MG TA: 5 | 15 days supply | Qty: 15 | Fill #0

## 2020-07-13 MED FILL — MELOXICAM 7.5 MG TABLET: 7.5 | 14 days supply | Qty: 14 | Fill #0

## 2020-07-13 NOTE — Progress Notes (Signed)
Subjective:    Patient ID: Kaitlyn Bowman, female    DOB: [birth year], 60 y.o.   MRN: [id number]  HPI  Patient is a 59 yr old female who presents today with chief complaint of pain above her right shoulder blade.  Pain began after she was picked up laundry at work last week.  She has not tried any medications for pain.   Review of Systems See HPI  Past Medical History:  Diagnosis Date  . Abnormal TSH   . Alcohol abuse   . Cocaine abuse ([place])    Remote crack cocaine use. UDS negative for cocaine 01/2012  . Coronary artery disease     inferior STEMI 01/13/12 s/p PTCA/DES to mid RCA. Initial EF 45% by cath but improved to 55-60% by echo 01/14/12  . Dyslipidemia    Trig 201, HDL 36, LDL 197 01/2012  . GERD (gastroesophageal reflux disease)   . History of MI (myocardial infarction) 01/2012  . Hypothyroidism   . Myocardial infarction ([place]) 2012  . Umbilical hernia      Social History   Socioeconomic History  . Marital status: Single    Spouse name: Not on file  . Number of children: 1  . Years of education: Not on file  . Highest education level: Not on file  Occupational History  . Occupation: Best boy: [employer]  Tobacco Use  . Smoking status: Former Smoker    Years: 33.00    Types: Cigarettes    Quit date: 06/13/2008    Years since quitting: 12.0  . Smokeless tobacco: Never Used  . Tobacco comment: None since 2009   Substance and Sexual Activity  . Alcohol use: No    Comment: former  . Drug use: No    Types: "Crack" cocaine    Comment: Crack cocaine; none since Jan 2006   . Sexual activity: Not on file  Other Topics Concern  . 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   Enjoys reading, sewing, Fill in puzzles, walking   Completed GED.   No pets.               Social  Determinants of Health   Financial Resource Strain:   . Difficulty of Paying Living Expenses: Not on file  Food Insecurity:   . Worried About Charity fundraiser in the Last Year: Not on file  . Ran Out of Food in the Last Year: Not on file  Transportation Needs:   . Lack of Transportation (Medical): Not on file  . Lack of Transportation (Non-Medical): Not on file  Physical Activity:   . Days of Exercise per Week: Not on file  . Minutes of Exercise per Session: Not on file  Stress:   . Feeling of Stress : Not on file  Social Connections:   . Frequency of Communication with Friends and Family: Not on file  . Frequency of Social Gatherings with Friends and Family: Not on file  . Attends Religious Services: Not on file  . Active Member of Clubs or Organizations: Not on file  . Attends Archivist Meetings: Not on file  . Marital Status: Not on file  Intimate Partner Violence:   . Fear of  Current or Ex-Partner: Not on file  . Emotionally Abused: Not on file  . Physically Abused: Not on file  . Sexually Abused: Not on file    Past Surgical History:  Procedure Laterality Date  . ABDOMINAL HYSTERECTOMY  1981   partial  . CARDIAC CATHETERIZATION  2010, 2013   Negative. 2013--100% blockage  . HERNIA REPAIR  2017   abd hernia   . LEFT HEART CATHETERIZATION WITH CORONARY ANGIOGRAM N/A 01/12/2012   Procedure: LEFT HEART CATHETERIZATION WITH CORONARY ANGIOGRAM;  Surgeon: Burnell Blanks, MD;  Location: [facility] CATH LAB;  Service: Cardiovascular;  Laterality: N/A;    Family History  Problem Relation Age of Onset  . Heart attack Mother 4       prior pacemaker  . Heart disease Mother   . Diabetes Mother   . Heart failure Father        CHF  . Heart disease Father   . Alcoholism Father   . Stroke Brother     Allergies  Allergen Reactions  . Ciprofloxacin     REACTION: nause-sweating-chest tightness  . Atorvastatin     Myalgia   . Crestor [Rosuvastatin Calcium]  Nausea Only  . Livalo [Pitavastatin]     myalgia  . Praluent [Alirocumab]     Muscle aches    Current Outpatient Medications on File Prior to Visit  Medication Sig Dispense Refill  . aspirin EC 81 MG tablet Take 1 tablet (81 mg total) by mouth daily. 90 tablet 3  . fluticasone (FLONASE) 50 MCG/ACT nasal spray Place 2 sprays into both nostrils daily. 16 g 1  . nitroGLYCERIN (NITROSTAT) 0.4 MG SL tablet Place 1 tablet (0.4 mg total) under the tongue every 5 (five) minutes x 3 doses as needed for chest pain. 25 tablet 6  . sodium chloride (OCEAN) 0.65 % SOLN nasal spray Place 1 spray into both nostrils as needed for congestion. 15 mL 0   No current facility-administered medications on file prior to visit.    BP (!) 132/55 (BP Location: Left Arm, Patient Position: Sitting, Cuff Size: Large)   Pulse 68   Temp 98.2 F (36.8 C) (Oral)   Resp 16   Wt 218 lb (98.9 kg)   SpO2 99%   BMI 34.14 kg/m       Objective:   Physical Exam Constitutional:      Appearance: She is well-developed.  Cardiovascular:     Rate and Rhythm: Normal rate and regular rhythm.     Heart sounds: Normal heart sounds. No murmur heard.   Pulmonary:     Effort: Pulmonary effort is normal. No respiratory distress.     Breath sounds: Normal breath sounds. No wheezing.  Musculoskeletal:     Comments: Positive tenderness noted above right scapula.  No redness or erythema is noted  Again noted is a lipoma in the right mid back  Psychiatric:        Behavior: Behavior normal.        Thought Content: Thought content normal.        Judgment: Judgment normal.           Assessment & Plan:  Muscle strain-recommended short course of meloxicam and as needed Flexeril at bedtime.  Patient is advised to call if symptoms worsen or if symptoms fail to improve.  Vaccine counseling-patient has not been vaccinated against COVID-19 or flu.  Discussed the importance of receiving both vaccines for her health.  I did give  her a cdc handout  today on the Covid vaccine.

## 2020-07-13 NOTE — Patient Instructions (Signed)
Please begin meloxicam (anti-inflammatory) once daily as needed. You can use flexeril (muscle relaxer) at bedtime as needed.

## 2020-07-20 ENCOUNTER — Ambulatory Visit (INDEPENDENT_AMBULATORY_CARE_PROVIDER_SITE_OTHER): Payer: PRIVATE HEALTH INSURANCE | Admitting: Family Medicine

## 2020-07-20 ENCOUNTER — Other Ambulatory Visit: Payer: Self-pay

## 2020-07-20 ENCOUNTER — Encounter: Payer: Self-pay | Admitting: Family Medicine

## 2020-07-20 DIAGNOSIS — M170 Bilateral primary osteoarthritis of knee: Secondary | ICD-10-CM | POA: Diagnosis not present

## 2020-07-20 NOTE — Progress Notes (Signed)
Kaitlyn Bowman - 59 y.o. female MRN [id number]  Date of birth: 1960/12/02  SUBJECTIVE:  Including CC & ROS.  Chief Complaint  Patient presents with  . Follow-up    bilateral knee    Kaitlyn Bowman is a 59 y.o. female that is following up for her knee pain.  She reports significant improvement with the injections.  Does get popping in the right knee from time to time.   Review of Systems See HPI   HISTORY: Past Medical, Surgical, Social, and Family History Reviewed & Updated per EMR.   Pertinent Historical Findings include:  Past Medical History:  Diagnosis Date  . Abnormal TSH   . Alcohol abuse   . Cocaine abuse ([place])    Remote crack cocaine use. UDS negative for cocaine 01/2012  . Coronary artery disease     inferior STEMI 01/13/12 s/p PTCA/DES to mid RCA. Initial EF 45% by cath but improved to 55-60% by echo 01/14/12  . Dyslipidemia    Trig 201, HDL 36, LDL 197 01/2012  . GERD (gastroesophageal reflux disease)   . History of MI (myocardial infarction) 01/2012  . Hypothyroidism   . Myocardial infarction ([place]) 2012  . Umbilical hernia     Past Surgical History:  Procedure Laterality Date  . ABDOMINAL HYSTERECTOMY  1981   partial  . CARDIAC CATHETERIZATION  2010, 2013   Negative. 2013--100% blockage  . HERNIA REPAIR  2017   abd hernia   . LEFT HEART CATHETERIZATION WITH CORONARY ANGIOGRAM N/A 01/12/2012   Procedure: LEFT HEART CATHETERIZATION WITH CORONARY ANGIOGRAM;  Surgeon: Burnell Blanks, MD;  Location: [facility] CATH LAB;  Service: Cardiovascular;  Laterality: N/A;    Family History  Problem Relation Age of Onset  . Heart attack Mother 34       prior pacemaker  . Heart disease Mother   . Diabetes Mother   . Heart failure Father        CHF  . Heart disease Father   . Alcoholism Father   . Stroke Brother     Social History   Socioeconomic History  . Marital status: Single    Spouse name: Not on file  . Number of children: 1  . Years of education: Not on file    . Highest education level: Not on file  Occupational History  . Occupation: Best boy: [employer]  Tobacco Use  . Smoking status: Former Smoker    Years: 33.00    Types: Cigarettes    Quit date: 06/13/2008    Years since quitting: 12.1  . Smokeless tobacco: Never Used  . Tobacco comment: None since 2009   Substance and Sexual Activity  . Alcohol use: No    Comment: former  . Drug use: No    Types: "Crack" cocaine    Comment: Crack cocaine; none since Jan 2006   . Sexual activity: Not on file  Other Topics Concern  . 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   Enjoys reading, sewing, Fill in puzzles, walking   Completed GED.   No pets.               Social Determinants of Health   Financial Resource Strain:   .  Difficulty of Paying Living Expenses: Not on file  Food Insecurity:   . Worried About Charity fundraiser in the Last Year: Not on file  . Ran Out of Food in the Last Year: Not on file  Transportation Needs:   . Lack of Transportation (Medical): Not on file  . Lack of Transportation (Non-Medical): Not on file  Physical Activity:   . Days of Exercise per Week: Not on file  . Minutes of Exercise per Session: Not on file  Stress:   . Feeling of Stress : Not on file  Social Connections:   . Frequency of Communication with Friends and Family: Not on file  . Frequency of Social Gatherings with Friends and Family: Not on file  . Attends Religious Services: Not on file  . Active Member of Clubs or Organizations: Not on file  . Attends Archivist Meetings: Not on file  . Marital Status: Not on file  Intimate Partner Violence:   . Fear of Current or Ex-Partner: Not on file  . Emotionally Abused: Not on file  . Physically Abused: Not on file  . Sexually Abused: Not on  file     PHYSICAL EXAM:  VS: Ht 5\' 7"  (1.702 m)   Wt 219 lb (99.3 kg)   BMI 34.30 kg/m  Physical Exam Gen: NAD, alert, cooperative with exam, well-appearing   ASSESSMENT & PLAN:   OA (osteoarthritis) of knee Improvement with her knee pain since the injections. -Counseled on supportive care. -Could consider gel injections or physical therapy.

## 2020-07-20 NOTE — Assessment & Plan Note (Signed)
Improvement with her knee pain since the injections. -Counseled on supportive care. -Could consider gel injections or physical therapy.

## 2020-08-30 ENCOUNTER — Other Ambulatory Visit: Payer: Self-pay

## 2020-08-30 ENCOUNTER — Encounter: Payer: Self-pay | Admitting: Cardiovascular Disease

## 2020-08-30 ENCOUNTER — Ambulatory Visit (INDEPENDENT_AMBULATORY_CARE_PROVIDER_SITE_OTHER): Payer: PRIVATE HEALTH INSURANCE | Admitting: Cardiovascular Disease

## 2020-08-30 VITALS — BP 146/86 | HR 72 | Ht 67.0 in | Wt 219.4 lb

## 2020-08-30 DIAGNOSIS — I2510 Atherosclerotic heart disease of native coronary artery without angina pectoris: Secondary | ICD-10-CM

## 2020-08-30 DIAGNOSIS — E782 Mixed hyperlipidemia: Secondary | ICD-10-CM

## 2020-08-30 NOTE — Progress Notes (Signed)
Chief Complaint  Patient presents with  . Follow-up    CAD    History of Present Illness: 59 yo female with history of CAD and hyperlipidemia here today for cardiac follow up. She had an inferior STEMI in April 2013 and was found to have an occluded mid RCA which was treated with a drug eluting stent. Echo May 2016 with normal LV function. Stress test in 2016 with no ischemia.    She is here today for follow up. The patient denies any chest pain, dyspnea, palpitations, lower extremity edema, orthopnea, PND, dizziness, near syncope or syncope.   Primary Care Provider: [name], NP   Past Medical History:  Diagnosis Date  . Abnormal TSH   . Alcohol abuse   . Cocaine abuse ([place])    Remote crack cocaine use. UDS negative for cocaine 01/2012  . Coronary artery disease     inferior STEMI 01/13/12 s/p PTCA/DES to mid RCA. Initial EF 45% by cath but improved to 55-60% by echo 01/14/12  . Dyslipidemia    Trig 201, HDL 36, LDL 197 01/2012  . GERD (gastroesophageal reflux disease)   . History of MI (myocardial infarction) 01/2012  . Hypothyroidism   . Myocardial infarction ([place]) 2012  . Umbilical hernia     Past Surgical History:  Procedure Laterality Date  . ABDOMINAL HYSTERECTOMY  1981   partial  . CARDIAC CATHETERIZATION  2010, 2013   Negative. 2013--100% blockage  . HERNIA REPAIR  2017   abd hernia   . LEFT HEART CATHETERIZATION WITH CORONARY ANGIOGRAM N/A 01/12/2012   Procedure: LEFT HEART CATHETERIZATION WITH CORONARY ANGIOGRAM;  Surgeon: Burnell Blanks, MD;  Location: [facility] CATH LAB;  Service: Cardiovascular;  Laterality: N/A;    Current Outpatient Medications  Medication Sig Dispense Refill  . aspirin EC 81 MG tablet Take 1 tablet (81 mg total) by mouth daily. 90 tablet 3  . cyclobenzaprine (FLEXERIL) 5 MG tablet Take 1 tablet (5 mg total) by mouth at bedtime as needed for muscle spasms. 15 tablet 0  . fluticasone (FLONASE) 50 MCG/ACT nasal spray Place 2 sprays  into both nostrils daily. 16 g 1  . meloxicam (MOBIC) 7.5 MG tablet Take 1 tablet (7.5 mg total) by mouth daily. 14 tablet 0  . nitroGLYCERIN (NITROSTAT) 0.4 MG SL tablet Place 1 tablet (0.4 mg total) under the tongue every 5 (five) minutes x 3 doses as needed for chest pain. 25 tablet 6  . sodium chloride (OCEAN) 0.65 % SOLN nasal spray Place 1 spray into both nostrils as needed for congestion. 15 mL 0   No current facility-administered medications for this visit.    Allergies  Allergen Reactions  . Ciprofloxacin     REACTION: nause-sweating-chest tightness  . Atorvastatin     Myalgia   . Crestor [Rosuvastatin Calcium] Nausea Only  . Livalo [Pitavastatin]     myalgia  . Praluent [Alirocumab]     Muscle aches    Social History   Socioeconomic History  . Marital status: Single    Spouse name: Not on file  . Number of children: 1  . Years of education: Not on file  . Highest education level: Not on file  Occupational History  . Occupation: Best boy: [employer]  Tobacco Use  . Smoking status: Former Smoker    Years: 33.00    Types: Cigarettes    Quit date: 06/13/2008    Years since quitting: 12.2  . Smokeless tobacco: Never Used  .  Tobacco comment: None since 2009   Substance and Sexual Activity  . Alcohol use: No    Comment: former  . Drug use: No    Types: "Crack" cocaine    Comment: Crack cocaine; none since Jan 2006   . Sexual activity: Not on file  Other Topics Concern  . 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   Enjoys reading, sewing, Fill in puzzles, walking   Completed GED.   No pets.               Social Determinants of Health   Financial Resource Strain:   . Difficulty of Paying Living Expenses: Not on file  Food Insecurity:   . Worried About Sales executive in the Last Year: Not on file  . Ran Out of Food in the Last Year: Not on file  Transportation Needs:   . Lack of Transportation (Medical): Not on file  . Lack of Transportation (Non-Medical): Not on file  Physical Activity:   . Days of Exercise per Week: Not on file  . Minutes of Exercise per Session: Not on file  Stress:   . Feeling of Stress : Not on file  Social Connections:   . Frequency of Communication with Friends and Family: Not on file  . Frequency of Social Gatherings with Friends and Family: Not on file  . Attends Religious Services: Not on file  . Active Member of Clubs or Organizations: Not on file  . Attends Archivist Meetings: Not on file  . Marital Status: Not on file  Intimate Partner Violence:   . Fear of Current or Ex-Partner: Not on file  . Emotionally Abused: Not on file  . Physically Abused: Not on file  . Sexually Abused: Not on file    Family History  Problem Relation Age of Onset  . Heart attack Mother 33       prior pacemaker  . Heart disease Mother   . Diabetes Mother   . Heart failure Father        CHF  . Heart disease Father   . Alcoholism Father   . Stroke Brother     Review of Systems:  As stated in the HPI and otherwise negative.   BP (!) 146/86   Pulse 72   Ht 5\' 7"  (1.702 m)   Wt 219 lb 6.4 oz (99.5 kg)   SpO2 95%   BMI 34.36 kg/m   Physical Examination:  General: Well developed, well nourished, NAD  HEENT: OP clear, mucus membranes moist  SKIN: warm, dry. No rashes. Neuro: No focal deficits  Musculoskeletal: Muscle strength 5/5 all ext  Psychiatric: Mood and affect normal  Neck: No JVD, no carotid bruits, no thyromegaly, no lymphadenopathy.  Lungs:Clear bilaterally, no wheezes, rhonci, crackles Cardiovascular: Regular rate and rhythm. No murmurs, gallops or rubs. Abdomen:Soft. Bowel sounds present. Non-tender.  Extremities: No lower extremity edema. Pulses are 2 + in the bilateral DP/PT.  Echo 02/15/15: -  Left ventricle: The cavity size was normal. Systolic function was normal. The estimated ejection fraction was in the range of 55% to 60%. Wall motion was normal; there were no regional wall motion abnormalities. Left ventricular diastolic function parameters were normal. - Aortic valve:  There was trivial regurgitation. - Mitral valve: There was mild to moderate regurgitation directed centrally. - Left atrium: The atrium was mildly dilated.   EKG:  EKG is ordered today. The ekg ordered today demonstrates Sinus  Recent Labs: 06/01/2020: ALT 22; BUN 11; Creat 0.74; Hemoglobin 13.0; Platelets 310; Potassium 4.4; Sodium 139; TSH 3.87   Lipid Panel    Component Value Date/Time   CHOL 274 (H) 06/01/2020 1500   CHOL 152 12/03/2018 0727   TRIG 282 (H) 06/01/2020 1500   HDL 37 (L) 06/01/2020 1500   HDL 46 12/03/2018 0727   CHOLHDL 7.4 (H) 06/01/2020 1500   VLDL 49.2 (H) 07/29/2018 0713   LDLCALC 187 (H) 06/01/2020 1500   LDLDIRECT 76 12/03/2018 0727   LDLDIRECT 63.0 07/29/2018 0713     Wt Readings from Last 3 Encounters:  08/30/20 219 lb 6.4 oz (99.5 kg)  07/20/20 219 lb (99.3 kg)  07/13/20 218 lb (98.9 kg)     Other studies Reviewed: Additional studies/ records that were reviewed today include:  Review of the above records demonstrates:   Cardiac cath 01/12/12: Left main: No obstructive disease.  Left Anterior Descending Artery: Large vessel that courses to the apex. Mild luminal irregularities in the mid vessel. Moderate sized diagonal branch with no disease.  Circumflex Artery: Large caliber vessel with serial 20% lesions in the mid vessel. OM1 is moderate sized with no disease. The second and third marginals are small. The fourth OM branch is moderate sized and has a 30% proximal stenosis.  Right Coronary Artery: Dominant vessel with 100% mid occlusion.  Left Ventricular Angiogram: LVEF 45%. Inferior wall hypokinesis.    Assessment and Plan:   1. CAD without  angina: She has no chest pain. She does not tolerate statins due to muscle aches or beta blockers due to fatigue. Exercise stress test May 2016 with no ischemia. Normal LV function by echo 2016. Continue ASA  2. HLD: LDL was 187 in August 2021. She had been on Praluent and was being followed in the lipid clinic. She stopped Praluent in January 2021 due to lack of insurance and also muscle aches. She does not tolerate statins. She would like to repeat her statins today. She is agreeable to being seen in the lipid clinic.   Current medicines are reviewed at length with the patient today.  The patient does not have concerns regarding medicines.  The following changes have been made:  no change  Labs/ tests ordered today include:  Orders Placed This Encounter  Procedures  . Lipid Profile  . EKG 12-Lead    Disposition:   FU with me in 12 months  Signed, Lauree Chandler, MD 08/30/2020 4:01 PM    [facility] [address], [place], [place]  [address] Phone: [phone]; Fax: [phone]

## 2020-08-30 NOTE — Patient Instructions (Signed)
Medication Instructions:  No changes today *If you need a refill on your cardiac medications before your next appointment, please call your pharmacy*   Lab Work: Today: LIPIDS  If you have labs (blood work) drawn today and your tests are completely normal, you will receive your results only by: Marland Kitchen MyChart Message (if you have MyChart) OR . A paper copy in the mail If you have any lab test that is abnormal or we need to change your treatment, we will call you to review the results.   Testing/Procedures: none   Follow-Up: At [facility], you and your health needs are our priority.  As part of our continuing mission to provide you with exceptional heart care, we have created designated Provider Care Teams.  These Care Teams include your primary Cardiologist (physician) and Advanced Practice Providers (APPs -  Physician Assistants and Nurse Practitioners) who all work together to provide you with the care you need, when you need it.   Your next appointment:   12 month(s)  The format for your next appointment:   In Person  Provider:   You may see Lauree Chandler, MD or one of the following Advanced Practice Providers on your designated Care Team:    Melina Copa, PA-C  Ermalinda Barrios, PA-C    Other Instructions  Please make an appointment with the [facility] as soon as possible.

## 2020-08-31 LAB — LIPID PANEL
Chol/HDL Ratio: 7.6 ratio — ABNORMAL HIGH (ref 0.0–4.4)
Cholesterol, Total: 304 mg/dL — ABNORMAL HIGH (ref 100–199)
HDL: 40 mg/dL (ref >39)
LDL Chol Calc (NIH): 220 mg/dL — ABNORMAL HIGH (ref 0–99)
Triglycerides: 222 mg/dL — ABNORMAL HIGH (ref 0–149)
VLDL Cholesterol Cal: 44 mg/dL — ABNORMAL HIGH (ref 5–40)

## 2020-10-11 ENCOUNTER — Ambulatory Visit: Payer: PRIVATE HEALTH INSURANCE

## 2020-10-11 NOTE — Progress Notes (Deleted)
Patient ID: Kaitlyn Bowman                 DOB: 09/24/1961                    MRN: [id number]     HPI: Kaitlyn Bowman is a 60 y.o. female patient referred to lipid clinic by Dr Clifton James. PMH is significant for   Current Medications:  Intolerances:  Risk Factors:  LDL goal:   Diet:   Exercise:   Family History:   Social History:   Labs: TC 304, Trigs 222, LDL 220, HDL 40 (08/31/20 -  Not on any meds)  Past Medical History:  Diagnosis Date  . Abnormal TSH   . Alcohol abuse   . Cocaine abuse ([facility])    Remote crack cocaine use. UDS negative for cocaine 01/2012  . Coronary artery disease     inferior STEMI 01/13/12 s/p PTCA/DES to mid RCA. Initial EF 45% by cath but improved to 55-60% by echo 01/14/12  . Dyslipidemia    Trig 201, HDL 36, LDL 197 01/2012  . GERD (gastroesophageal reflux disease)   . History of MI (myocardial infarction) 01/2012  . Hypothyroidism   . Myocardial infarction ([facility]) 2012  . Umbilical hernia     Current Outpatient Medications on File Prior to Visit  Medication Sig Dispense Refill  . aspirin EC 81 MG tablet Take 1 tablet (81 mg total) by mouth daily. 90 tablet 3  . cyclobenzaprine (FLEXERIL) 5 MG tablet Take 1 tablet (5 mg total) by mouth at bedtime as needed for muscle spasms. 15 tablet 0  . fluticasone (FLONASE) 50 MCG/ACT nasal spray Place 2 sprays into both nostrils daily. 16 g 1  . meloxicam (MOBIC) 7.5 MG tablet Take 1 tablet (7.5 mg total) by mouth daily. 14 tablet 0  . nitroGLYCERIN (NITROSTAT) 0.4 MG SL tablet Place 1 tablet (0.4 mg total) under the tongue every 5 (five) minutes x 3 doses as needed for chest pain. 25 tablet 6  . sodium chloride (OCEAN) 0.65 % SOLN nasal spray Place 1 spray into both nostrils as needed for congestion. 15 mL 0   No current facility-administered medications on file prior to visit.    Allergies  Allergen Reactions  . Ciprofloxacin     REACTION: nause-sweating-chest tightness  . Atorvastatin     Myalgia   .  Crestor [Rosuvastatin Calcium] Nausea Only  . Livalo [Pitavastatin]     myalgia  . Praluent [Alirocumab]     Muscle aches    Assessment/Plan:  1. Hyperlipidemia -

## 2020-10-18 NOTE — Progress Notes (Signed)
Patient ID: Kaitlyn Bowman                 DOB: 06/30/1961                    MRN: [id number]     HPI: Kaitlyn Bowman is a 60 y.o. female patient referred to lipid clinic by Dr. Angelena Form. PMH is significant for CAD, hyperlipidemia, HTN, inferior STEMI in April 2013 with occluded mid RCA treated w/DES.  Patient presents today in good spirits. Reports currently not on a lipid-lowering medication. Reports history of statin-intolerance with multiple statins (rosuvastatin, atorvastatin, pitavastatin and pravastatin) and PCKS9-inhibitor (Praluent) due to myalgias. Pt cannot recall previous use of Repatha. Discussed current diet and exercise (see below).  Current Medications: None Intolerances: atorvastatin 80mg  daily (myalgia), pitavastatin 4mg  daily (myalgia), Praluent 150 mg every 2 weeks (muscle aches), rosuvastatin 10mg  3x weekly (nausea), pravastatin 80 mg daily (myalgia) Risk Factors: prior MI, HTN, HLD (LDL >220 mg/dl), former smoker, fhx heart disease LDL goal: <70 mg/dL  Diet: Eats 3 meals/day -Breakfast: skips, protein shake every now and then -Lunch: chinese food, taco bell -Dinner: fast food, grilled chicken breast with black garlic seasoning -Drinks: milk, orange juice, soda (pepsi) -Snacks: ice cream, potato chips   Exercise: 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 Abnormal TSH   . Alcohol abuse   . Cocaine abuse ([place])    Remote crack cocaine use. UDS negative for cocaine 01/2012  . Coronary artery disease     inferior STEMI 01/13/12 s/p PTCA/DES to mid RCA.  Initial EF 45% by cath but improved to 55-60% by echo 01/14/12  . Dyslipidemia    Trig 201, HDL 36, LDL 197 01/2012  . GERD (gastroesophageal reflux disease)   . History of MI (myocardial infarction) 01/2012  . Hypothyroidism   . Myocardial infarction ([place]) 2012  . Umbilical hernia     Current Outpatient Medications on File Prior to Visit  Medication Sig Dispense Refill  . aspirin EC 81 MG tablet Take 1 tablet (81 mg total) by mouth daily. 90 tablet 3  . cyclobenzaprine (FLEXERIL) 5 MG tablet Take 1 tablet (5 mg total) by mouth at bedtime as needed for muscle spasms. 15 tablet 0  . fluticasone (FLONASE) 50 MCG/ACT nasal spray Place 2 sprays into both nostrils daily. 16 g 1  . meloxicam (MOBIC) 7.5 MG tablet Take 1 tablet (7.5 mg total) by mouth daily. 14 tablet 0  . nitroGLYCERIN (NITROSTAT) 0.4 MG SL tablet Place 1 tablet (0.4 mg total) under the tongue every 5 (five) minutes x 3 doses as needed for chest pain. 25 tablet 6  . sodium chloride (OCEAN) 0.65 % SOLN nasal spray Place 1 spray into both nostrils as needed for congestion. 15 mL 0   No current facility-administered medications on file prior to visit.    Allergies  Allergen Reactions  . Ciprofloxacin     REACTION: nause-sweating-chest tightness  . Atorvastatin     Myalgia   . Crestor [Rosuvastatin Calcium] Nausea Only  . Livalo [Pitavastatin]  myalgia  . Praluent [Alirocumab]     Muscle aches    Assessment/Plan:  1. Hyperlipidemia - LDL far above goal <70 mg/dL with history of clinical ASCVD second to medication intolerances and dietary indiscretion. Pt intolerant to multiple statins and Praluent secondary to myalgias. She does not wish to rechallenge with Repatha. Patient willing to start Nexlizet 180/10 mg daily pending PA approval. Encouraged patient to aim for a diet full of vegetables, fruit and lean meats (chicken, [place], fish) and to limit carbs (bread, pasta, sugar, rice) and red meat consumption. Encouraged  patient to exercise 20-30 minutes daily with the goal of 150 minutes per week. Patient verbalized understanding. Will scheduled follow-up fasting lipid panel and LFTs in 3 months.  Kaitlyn Bowman, PharmD, [facility] [address], [place], [place] [address] Phone: [phone]; Fax: [phone]

## 2020-10-19 ENCOUNTER — Other Ambulatory Visit: Payer: Self-pay

## 2020-10-19 ENCOUNTER — Ambulatory Visit (INDEPENDENT_AMBULATORY_CARE_PROVIDER_SITE_OTHER): Payer: PRIVATE HEALTH INSURANCE | Admitting: Pharmacist

## 2020-10-19 DIAGNOSIS — E782 Mixed hyperlipidemia: Secondary | ICD-10-CM

## 2020-10-19 DIAGNOSIS — T466X5A Adverse effect of antihyperlipidemic and antiarteriosclerotic drugs, initial encounter: Secondary | ICD-10-CM | POA: Diagnosis not present

## 2020-10-19 DIAGNOSIS — G720 Drug-induced myopathy: Secondary | ICD-10-CM | POA: Diagnosis not present

## 2020-10-19 NOTE — Patient Instructions (Addendum)
Nice to see you today!  Keep up the good work with diet and exercise. Aim for a diet full of vegetables, fruit and lean meats (chicken, [place], fish). Try to limit carbs (bread, pasta, sugar, rice) and red meat consumption.  Your goal LDL is <70 mg/dL, you're currently at 220 mg/dL  Medication Changes: Will contact you once Nexlizet is approved and ready to be picked up from your pharmacy.  -Nexlizet 180/10 mg once daily  Please give [place] a call at [phone] with any questions or concerns.

## 2020-10-25 ENCOUNTER — Telehealth: Payer: Self-pay | Admitting: Pharmacist

## 2020-10-25 DIAGNOSIS — E782 Mixed hyperlipidemia: Secondary | ICD-10-CM

## 2020-10-25 MED ORDER — NEXLIZET 180-10 MG PO TABS: 1.0000 | ORAL_TABLET | Freq: Every day | ORAL | 3 refills | Status: DC

## 2020-10-25 NOTE — Telephone Encounter (Signed)
Nexlizet prior authorization approved through 10/24/21. Rx sent to [facility]. Copay card activated and faxed to pharmacy as well with below info: Kaitlyn Bowman: [id number], [place], GRP [id number], ID [id number].  Called pt to make her aware, no answer.

## 2020-10-26 ENCOUNTER — Telehealth (INDEPENDENT_AMBULATORY_CARE_PROVIDER_SITE_OTHER): Payer: PRIVATE HEALTH INSURANCE | Admitting: Family

## 2020-10-26 ENCOUNTER — Encounter: Payer: Self-pay | Admitting: Family

## 2020-10-26 VITALS — BP 137/90 | HR 86 | Wt 213.0 lb

## 2020-10-26 DIAGNOSIS — B349 Viral infection, unspecified: Secondary | ICD-10-CM | POA: Diagnosis not present

## 2020-10-26 NOTE — Telephone Encounter (Signed)
Informed patient Nexlizet has been approved and sent to preferred pharmacy. Patient verbalized understanding. Fasting lipid panel and LFTs scheduled in 3 months.

## 2020-10-26 NOTE — Progress Notes (Signed)
Virtual Visit via Video Note  I connected with Kaitlyn Bowman on 10/26/20 at 12:20 PM EST by a video enabled telemedicine application and verified that I am speaking with the correct person using two identifiers.  Location: Patient: home Provider: home   I discussed the limitations of evaluation and management by telemedicine and the availability of in person appointments. The patient expressed understanding and agreed to proceed. Only the patient and myself were present for today's video call.   History of Present Illness:  Patient reports "flu-like symptoms" which started yesterday.  Taking Theraflu and cough drops. Initial symptoms included myalgias, mild cough, headache. Today, she has only mild myalgias and reports resolution of other symptoms. She wants clearance to return to work.    Observations/Objective:   Gen: Awake, alert, no acute distress Resp: Breathing is even and non-labored Psych: calm/pleasant demeanor Neuro: Alert and Oriented x 3, + facial symmetry, speech is clear.   Assessment and Plan:  Viral illness- advised pt that she will either need to wait 5 days (covid quarantine) or have a negative covid-19 test before I can safely release her back to work. I suggested that she call [place] at [phone] to schedule an appointment for testing.  I advised her that after she is feeling well she should get a covid-19 booster. She is advised to call if symptoms do not continue to improve and to go to the ER if she develops SOB.    Follow Up Instructions:    I discussed the assessment and treatment plan with the patient. The patient was provided an opportunity to ask questions and all were answered. The patient agreed with the plan and demonstrated an understanding of the instructions.   The patient was advised to call back or seek an in-person evaluation if the symptoms worsen or if the condition fails to improve as anticipated.  Nance Pear, NP

## 2020-10-28 ENCOUNTER — Other Ambulatory Visit: Payer: PRIVATE HEALTH INSURANCE

## 2020-10-28 DIAGNOSIS — Z20822 Contact with and (suspected) exposure to covid-19: Secondary | ICD-10-CM

## 2020-10-29 LAB — SARS-COV-2, NAA 2 DAY TAT

## 2020-10-29 LAB — NOVEL CORONAVIRUS, NAA: SARS-CoV-2, NAA: DETECTED — AB

## 2020-10-30 ENCOUNTER — Encounter: Payer: Self-pay | Admitting: Family Medicine

## 2020-10-30 ENCOUNTER — Telehealth: Payer: PRIVATE HEALTH INSURANCE | Admitting: Family Medicine

## 2020-10-30 DIAGNOSIS — R0989 Other specified symptoms and signs involving the circulatory and respiratory systems: Secondary | ICD-10-CM

## 2020-10-30 DIAGNOSIS — U071 COVID-19: Secondary | ICD-10-CM

## 2020-10-30 MED ORDER — GUAIFENESIN ER 600 MG PO TB12: 600.0000 mg | ORAL_TABLET | Freq: Two times a day (BID) | ORAL | 0 refills | Status: DC | PRN

## 2020-10-30 NOTE — Patient Instructions (Signed)
We discussed your positive COVID-19 results at length.  Discussed current CDC guidelines concerning isolation, quarantine.  Discussed interventions for chest congestion including over-the-counter Mucinex without decongestant every 12 hours as needed.  Increase fluid intake intake and rest.  Monitor symptoms closely.  Discussed supportive care if needed.  Also, agree to compose return to work letter that will be available in [place]. Thank you for letting us participate in your care COVID-19: What to Do if You Are Sick If you have a fever, cough or other symptoms, you might have COVID-19. Most people have mild illness and are able to recover at home. If you are sick:  Keep track of your symptoms.  If you have an emergency warning sign (including trouble breathing), call 911. Steps to help prevent the spread of COVID-19 if you are sick If you are sick with COVID-19 or think you might have COVID-19, follow the steps below to care for yourself and to help protect other people in your home and community. Stay home except to get medical care  Stay home. Most people with COVID-19 have mild illness and can recover at home without medical care. Do not leave your home, except to get medical care. Do not visit public areas.  Take care of yourself. Get rest and stay hydrated. Take over-the-counter medicines, such as acetaminophen, to help you feel better.  Stay in touch with your doctor. Call before you get medical care. Be sure to get care if you have trouble breathing, or have any other emergency warning signs, or if you think it is an emergency.  Avoid public transportation, ride-sharing, or taxis. Separate yourself from other people As much as possible, stay in a specific room and away from other people and pets in your home. If possible, you should use a separate bathroom. If you need to be around other people or animals in or outside of the home, wear a mask. Tell your close contactsthat they may have  been exposed to COVID-19. An infected person can spread COVID-19 starting 48 hours (or 2 days) before the person has any symptoms or tests positive. By letting your close contacts know they may have been exposed to COVID-19, you are helping to protect everyone.  Additional guidance is available for those living in close quarters and shared housing.  See COVID-19 and Animals if you have questions about pets.  If you are diagnosed with COVID-19, someone from the health department may call you. Answer the call to slow the spread. Monitor your symptoms  Symptoms of COVID-19 include fever, cough, or other symptoms.  Follow care instructions from your healthcare provider and local health department. Your local health authorities may give instructions on checking your symptoms and reporting information. When to seek emergency medical attention Look for emergency warning signs* for COVID-19. If someone is showing any of these signs, seek emergency medical care immediately:  Trouble breathing  Persistent pain or pressure in the chest  New confusion  Inability to wake or stay awake  Pale, gray, or blue-colored skin, lips, or nail beds, depending on skin tone *This list is not all possible symptoms. Please call your medical provider for any other symptoms that are severe or concerning to you. Call 911 or call ahead to your local emergency facility: Notify the operator that you are seeking care for someone who has or may have COVID-19. Call ahead before visiting your doctor  Call ahead. Many medical visits for routine care are being postponed or done by phone or  telemedicine.  If you have a medical appointment that cannot be postponed, call your doctor's office, and tell them you have or may have COVID-19. This will help the office protect themselves and other patients. Get  tested  If you have symptoms of COVID-19, get tested. While waiting for test results, you stay away from others,  including staying apart from those living in your household.  You can visit your state, tribal, local, and territorialhealth department's website to look for the latest local information on testing sites. If you are sick, wear a mask over your nose and mouth  You should wear a mask over your nose and mouth if you must be around other people or animals, including pets (even at home).  You don't need to wear the mask if you are alone. If you can't put on a mask (because of trouble breathing, for example), cover your coughs and sneezes in some other way. Try to stay at least 6 feet away from other people. This will help protect the people around you.  Masks should not be placed on young children under age 63 years, anyone who has trouble breathing, or anyone who is not able to remove the mask without help. Note: During the COVID-19 pandemic, medical grade facemasks are reserved for healthcare workers and some first responders. Cover your coughs and sneezes  Cover your mouth and nose with a tissue when you cough or sneeze.  Throw away used tissues in a lined trash can.  Immediately wash your hands with soap and water for at least 20 seconds. If soap and water are not available, clean your hands with an alcohol-based hand sanitizer that contains at least 60% alcohol. Clean your hands often  Wash your hands often with soap and water for at least 20 seconds. This is especially important after blowing your nose, coughing, or sneezing; going to the bathroom; and before eating or preparing food.  Use hand sanitizer if soap and water are not available. Use an alcohol-based hand sanitizer with at least 60% alcohol, covering all surfaces of your hands and rubbing them together until they feel dry.  Soap and water are the best option, especially if hands are visibly dirty.  Avoid touching your eyes, nose, and mouth with unwashed hands.  Handwashing Tips Avoid sharing personal household items  Do not  share dishes, drinking glasses, cups, eating utensils, towels, or bedding with other people in your home.  Wash these items thoroughly after using them with soap and water or put in the dishwasher. Clean all "high-touch" surfaces everyday  Clean and disinfect high-touch surfaces in your "sick room" and bathroom; wear disposable gloves. Let someone else clean and disinfect surfaces in common areas, but you should clean your bedroom and bathroom, if possible.  If a caregiver or other person needs to clean and disinfect a sick person's bedroom or bathroom, they should do so on an as-needed basis. The caregiver/other person should wear a mask and disposable gloves prior to cleaning. They should wait as long as possible after the person who is sick has used the bathroom before coming in to clean and use the bathroom. ? High-touch surfaces include phones, remote controls, counters, tabletops, doorknobs, bathroom fixtures, toilets, keyboards, tablets, and bedside tables.  Clean and disinfect areas that may have blood, stool, or body fluids on them.  Use household cleaners and disinfectants. Clean the area or item with soap and water or another detergent if it is dirty. Then, use a household disinfectant. ?  Be sure to follow the instructions on the label to ensure safe and effective use of the product. Many products recommend keeping the surface wet for several minutes to ensure germs are killed. Many also recommend precautions such as wearing gloves and making sure you have good ventilation during use of the product. ? Use a product from [employer] List N: Disinfectants for Coronavirus (VWUJW-11). ? Complete Disinfection Guidance When you can be around others after being sick with COVID-19 Deciding when you can be around others is different for different situations. Find out when you can safely end home isolation. For any additional questions about your care, contact your healthcare provider or state or local  health department. 12/24/2019 Content source: [facility] ([facility]), Division of Viral Diseases This information is not intended to replace advice given to you by your health care provider. Make sure you discuss any questions you have with your health care provider. Document Revised: 08/09/2020 Document Reviewed: 08/09/2020 Elsevier Patient Education  2021 [place] vs. Isolation QUARANTINE keeps someone who was in close contact with someone who has COVID-19 away from others. Quarantine if you have been in close contact with someone who has COVID-19, unless you have been fully vaccinated. If you are fully vaccinated  You do NOT need to quarantine unless they have symptoms  Get tested 3-5 days after your exposure, even if you don't have symptoms  Wear a mask indoors in public for 14 days following exposure or until your test result is negative If you are not fully vaccinated  Stay home for 14 days after your last contact with a person who has COVID-19  Watch for fever (100.80F), cough, shortness of breath, or other symptoms of COVID-19  If possible, stay away from people you live with, especially people who are at higher risk for getting very sick from COVID-19  Contact your local public health department for options in your area to possibly shorten your quarantine ISOLATION keeps someone who is sick or tested positive for COVID-19 without symptoms away from others, even in their own home. People who are in isolation should stay home and stay in a specific "sick room" or area and use a separate bathroom (if available). If you are sick and think or know you have COVID-19 Stay home until after  At least 10 days since symptoms first appeared and  At least 24 hours with no fever without the use of fever-reducing medications and  Symptoms have improved If you tested positive for COVID-19 but do not have symptoms  Stay  home until after 10 days have passed since your positive viral test  If you develop symptoms after testing positive, follow the steps above for those who are sick [email or web address] 07/05/2020 This information is not intended to replace advice given to you by your health care provider. Make sure you discuss any questions you have with your health care provider. Document Revised: 08/09/2020 Document Reviewed: 08/09/2020 Elsevier Patient Education  2021 [employer].

## 2020-10-30 NOTE — Progress Notes (Signed)
[name], Bowman are scheduled for a virtual visit with your provider today.    Just as we do with appointments in the office, we must obtain your consent to participate.  Your consent will be active for this visit and any virtual visit you may have with one of our providers in the next 365 days.    If you have a MyChart account, I can also send a copy of this consent to you electronically.  All virtual visits are billed to your insurance company just like a traditional visit in the office.  As this is a virtual visit, video technology does not allow for your provider to perform a traditional examination.  This may limit your provider's ability to fully assess your condition.  If your provider identifies any concerns that need to be evaluated in person or the need to arrange testing such as labs, EKG, etc, we will make arrangements to do so.    Although advances in technology are sophisticated, we cannot ensure that it will always work on either your end or our end.  If the connection with a video visit is poor, we may have to switch to a telephone visit.  With either a video or telephone visit, we are not always able to ensure that we have a secure connection.   I need to obtain your verbal consent now.   Are you willing to proceed with your visit today?   Kaitlyn Bowman has provided verbal consent on 10/30/2020 for a virtual visit (video or telephone).  Virtual Visit via Video Note  I connected with Kaitlyn Bowman on 10/30/20 at 11:45 AM EST by a video enabled telemedicine application and verified that I am speaking with the correct person using two identifiers.  Location: Patient: Home  Provider: [address] [place], [place]   I discussed the limitations of evaluation and management by telemedicine and the availability of in person appointments. The patient expressed understanding and agreed to proceed.  History of Present Illness: Kaitlyn Bowman is a 60 year old female with a medical  history significant for hypertension and CAD that presents via video for interpretation of COVID-19 test results.  The patient was tested for COVID-19 on 10/28/2020 after possible exposure to COVID-19.  Patient says that she was in her usual state of health until Saturday, October 23, 2020.  She states that she was working and was exposed to a coworker with a "bad cough".  She says that no symptoms were present until she was stranded in the snow on Sunday and had to walk several miles in cold weather.  She states that the following day she developed persistent cough, congestion, and some fatigue.  She states that after increase rest and over-the-counter interventions symptoms dissipated.  She is currently asymptomatic and is inquiring about test results.  At this time she endorses mild congestion.  She denies persistent cough, chest pain, shortness of breath, headache, sore throat, or fatigue.  Patient has been taking all prescribed medications consistently and without interruption.  Patient has received COVID-19 vaccinations. Review of Systems  Constitutional: Negative for chills, fever and malaise/fatigue.  HENT: Positive for congestion.   Eyes: Negative.   Respiratory: Negative.   Cardiovascular: Negative.   Gastrointestinal: Negative.   Genitourinary: Negative.   Musculoskeletal: Negative.   Skin: Negative.   Neurological: Negative.   Endo/Heme/Allergies: Negative.   Psychiatric/Behavioral: Negative.    Past Medical History:  Diagnosis Date  . Abnormal TSH   . Alcohol abuse   .  Cocaine abuse ([place])    Remote crack cocaine use. UDS negative for cocaine 01/2012  . Coronary artery disease     inferior STEMI 01/13/12 s/p PTCA/DES to mid RCA. Initial EF 45% by cath but improved to 55-60% by echo 01/14/12  . Dyslipidemia    Trig 201, HDL 36, LDL 197 01/2012  . GERD (gastroesophageal reflux disease)   . History of MI (myocardial infarction) 01/2012  . Hypothyroidism   . Myocardial infarction ([place])  2012  . Umbilical hernia    Social History   Socioeconomic History  . Marital status: Single    Spouse name: Not on file  . Number of children: 1  . Years of education: Not on file  . Highest education level: Not on file  Occupational History  . Occupation: Best boy: [employer]  Tobacco Use  . Smoking status: Former Smoker    Years: 33.00    Types: Cigarettes    Quit date: 06/13/2008    Years since quitting: 12.3  . Smokeless tobacco: Never Used  . Tobacco comment: None since 2009   Substance and Sexual Activity  . Alcohol use: No    Comment: former  . Drug use: No    Types: "Crack" cocaine    Comment: Crack cocaine; none since Jan 2006   . Sexual activity: Not on file  Other Topics Concern  . 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   Enjoys reading, sewing, Fill in puzzles, walking   Completed GED.   No pets.               Social Determinants of Health   Financial Resource Strain: Not on file  Food Insecurity: Not on file  Transportation Needs: Not on file  Physical Activity: Not on file  Stress: Not on file  Social Connections: Not on file  Intimate Partner Violence: Not on file   Allergies  Allergen Reactions  . Ciprofloxacin     REACTION: nause-sweating-chest tightness  . Atorvastatin     Myalgia   . Crestor [Rosuvastatin Calcium] Nausea Only  . Livalo [Pitavastatin]     myalgia  . Praluent [Alirocumab]     Muscle aches   Immunization History  Administered Date(s) Administered  . PFIZER(Purple Top)SARS-COV-2 Vaccination 01/08/2020, 02/02/2020  . Tdap 02/21/2012      Observations/Objective:   Assessment and Plan: 1. COVID-19 Inform patient that COVID-19 by PCR was positive on 10/28/2020.  Patient expressed understanding of test results.  2. Lab test  positive for detection of COVID-19 virus Discussed guidelines with patient at length.  Per CDC asymptomatic and patient that have been afebrile for greater than 24 hours and symptoms are resolving he can quarantine for a total of 5 days.  Patient was diagnosed on 10/28/2020 and will complete quarantine on 11/03/2020.  3. Chest congestion Patient reports mild congestion, recommend over-the-counter Mucinex - guaiFENesin (MUCINEX) 600 MG 12 hr tablet; Take 1 tablet (600 mg total) by mouth 2 (two) times daily as needed for cough or to loosen phlegm.  Dispense: 30 tablet; Refill: 0 Follow Up Instructions:    I discussed the assessment and treatment plan with the patient. The patient was provided an opportunity to ask questions and all were answered.  The patient agreed with the plan and demonstrated an understanding of the instructions.   The patient was advised to call back or seek an in-person evaluation if the symptoms worsen or if the condition fails to improve as anticipated.  I provided 10 minutes of non-face-to-face time during this encounter.    Donia Pounds  APRN, MSN, FNP-C [facility] [address] [place], [place] [address] [phone]  10/30/2020  11:52 AM

## 2020-11-09 ENCOUNTER — Encounter: Payer: Self-pay | Admitting: Family

## 2020-11-09 ENCOUNTER — Telehealth: Payer: Self-pay | Admitting: Family

## 2020-11-09 MED ORDER — AZITHROMYCIN 250 MG PO TABS: ORAL_TABLET | ORAL | 0 refills | Status: DC

## 2020-11-09 NOTE — Telephone Encounter (Signed)
Patient advised of new prescription and to call for appointment if not better in 2-3 days

## 2020-11-09 NOTE — Telephone Encounter (Signed)
I will sent the zpak, however she will need in person visit if her symptoms worsen or if not improved in 2-3 days.

## 2020-11-12 ENCOUNTER — Encounter: Payer: Self-pay | Admitting: Family

## 2020-12-14 ENCOUNTER — Encounter: Payer: Self-pay | Admitting: Family

## 2020-12-14 ENCOUNTER — Telehealth: Payer: Self-pay | Admitting: Cardiovascular Disease

## 2020-12-14 ENCOUNTER — Ambulatory Visit: Payer: PRIVATE HEALTH INSURANCE | Admitting: Family

## 2020-12-14 ENCOUNTER — Ambulatory Visit (INDEPENDENT_AMBULATORY_CARE_PROVIDER_SITE_OTHER): Payer: 59 | Admitting: Family

## 2020-12-14 ENCOUNTER — Other Ambulatory Visit: Payer: Self-pay

## 2020-12-14 VITALS — BP 166/75 | HR 63 | Temp 98.6°F | Resp 16 | Ht 67.0 in | Wt 215.0 lb

## 2020-12-14 DIAGNOSIS — I2584 Coronary atherosclerosis due to calcified coronary lesion: Secondary | ICD-10-CM

## 2020-12-14 DIAGNOSIS — I10 Essential (primary) hypertension: Secondary | ICD-10-CM | POA: Diagnosis not present

## 2020-12-14 DIAGNOSIS — E785 Hyperlipidemia, unspecified: Secondary | ICD-10-CM

## 2020-12-14 DIAGNOSIS — I2510 Atherosclerotic heart disease of native coronary artery without angina pectoris: Secondary | ICD-10-CM

## 2020-12-14 MED ORDER — NEXLIZET 180-10 MG PO TABS: 1.0000 | ORAL_TABLET | Freq: Every day | ORAL | 3 refills | Status: DC

## 2020-12-14 MED ORDER — BETAMETHASONE DIPROPIONATE 0.05 % EX CREA: TOPICAL_CREAM | Freq: Two times a day (BID) | CUTANEOUS | 1 refills | Status: DC | PRN

## 2020-12-14 MED ORDER — AMLODIPINE BESYLATE 5 MG PO TABS: 5.0000 mg | ORAL_TABLET | Freq: Every day | ORAL | 3 refills | Status: DC

## 2020-12-14 NOTE — Progress Notes (Signed)
Subjective:    Patient ID: Kaitlyn Bowman, female    DOB: 28-Mar-1961, 60 y.o.   MRN: [id number]  HPI  Patient is a 60 yr old female who presents today for routine follow up.    HTN- reports + HA.   BP Readings from Last 3 Encounters:  12/14/20 (!) 166/75  10/26/20 137/90  08/30/20 (!) 146/86     Review of Systems See HPI  Past Medical History:  Diagnosis Date  . Abnormal TSH   . Alcohol abuse   . Cocaine abuse ([place])    Remote crack cocaine use. UDS negative for cocaine 01/2012  . Coronary artery disease     inferior STEMI 01/13/12 s/p PTCA/DES to mid RCA. Initial EF 45% by cath but improved to 55-60% by echo 01/14/12  . Dyslipidemia    Trig 201, HDL 36, LDL 197 01/2012  . GERD (gastroesophageal reflux disease)   . History of MI (myocardial infarction) 01/2012  . Hypothyroidism   . Myocardial infarction ([place]) 2012  . Umbilical hernia      Social History   Socioeconomic History  . Marital status: Single    Spouse name: Not on file  . Number of children: 1  . Years of education: Not on file  . Highest education level: Not on file  Occupational History  . Occupation: Best boy: [employer]  Tobacco Use  . Smoking status: Former Smoker    Years: 33.00    Types: Cigarettes    Quit date: 06/13/2008    Years since quitting: 12.5  . Smokeless tobacco: Never Used  . Tobacco comment: None since 2009   Substance and Sexual Activity  . Alcohol use: No    Comment: former  . Drug use: No    Types: "Crack" cocaine    Comment: Crack cocaine; none since Jan 2006   . Sexual activity: Not on file  Other Topics Concern  . 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   Enjoys reading, sewing, Fill in puzzles, walking   Completed GED.   No pets.               Social Determinants of Health    Financial Resource Strain: Not on file  Food Insecurity: Not on file  Transportation Needs: Not on file  Physical Activity: Not on file  Stress: Not on file  Social Connections: Not on file  Intimate Partner Violence: Not on file    Past Surgical History:  Procedure Laterality Date  . ABDOMINAL HYSTERECTOMY  1981   partial  . CARDIAC CATHETERIZATION  2010, 2013   Negative. 2013--100% blockage  . HERNIA REPAIR  2017   abd hernia   . LEFT HEART CATHETERIZATION WITH CORONARY ANGIOGRAM N/A 01/12/2012   Procedure: LEFT HEART CATHETERIZATION WITH CORONARY ANGIOGRAM;  Surgeon: Burnell Blanks, MD;  Location: [facility] CATH LAB;  Service: Cardiovascular;  Laterality: N/A;    Family History  Problem Relation Age of Onset  . Heart attack Mother 24       prior pacemaker  . Heart disease Mother   . Diabetes Mother   . Heart failure Father        CHF  . Heart disease Father   . Alcoholism  Father   . Stroke Brother     Allergies  Allergen Reactions  . Ciprofloxacin     REACTION: nause-sweating-chest tightness  . Atorvastatin     Myalgia   . Crestor [Rosuvastatin Calcium] Nausea Only  . Livalo [Pitavastatin]     myalgia  . Praluent [Alirocumab]     Muscle aches    Current Outpatient Medications on File Prior to Visit  Medication Sig Dispense Refill  . aspirin EC 81 MG tablet Take 1 tablet (81 mg total) by mouth daily. 90 tablet 3  . fluticasone (FLONASE) 50 MCG/ACT nasal spray Place 2 sprays into both nostrils daily. 16 g 1  . nitroGLYCERIN (NITROSTAT) 0.4 MG SL tablet Place 1 tablet (0.4 mg total) under the tongue every 5 (five) minutes x 3 doses as needed for chest pain. 25 tablet 6  . sodium chloride (OCEAN) 0.65 % SOLN nasal spray Place 1 spray into both nostrils as needed for congestion. 15 mL 0   No current facility-administered medications on file prior to visit.    BP (!) 166/75 (BP Location: Right Arm, Patient Position: Sitting, Cuff Size: Large)   Pulse 63    Temp 98.6 F (37 C) (Oral)   Resp 16   Ht 5\' 7"  (1.702 m)   Wt 215 lb (97.5 kg)   SpO2 99%   BMI 33.67 kg/m       Objective:   Physical Exam Constitutional:      Appearance: She is well-developed and well-nourished.  Cardiovascular:     Rate and Rhythm: Normal rate and regular rhythm.     Heart sounds: Normal heart sounds. No murmur heard.   Pulmonary:     Effort: Pulmonary effort is normal. No respiratory distress.     Breath sounds: Normal breath sounds. No wheezing.  Psychiatric:        Mood and Affect: Mood and affect normal.        Behavior: Behavior normal.        Thought Content: Thought content normal.        Judgment: Judgment normal.           Assessment & Plan:  Hypertension- uncontrolled. Will start amlodipine 5mg  once daily. Hopefully this will help with her headache.  Hyperlipidemia- she is awaiting PA for Nexlizet with cardiology.  She recently obtained new insurance.   CAD- on aspirin for secondary prevention (81mg  once daily).  Management per cardiology.   This visit occurred during the SARS-CoV-2 public health emergency.  Safety protocols were in place, including screening questions prior to the visit, additional usage of staff PPE, and extensive cleaning of exam room while observing appropriate contact time as indicated for disinfecting solutions.

## 2020-12-14 NOTE — Telephone Encounter (Signed)
Called and spoke w/pt and stated that we can place a copay card at the chst location and rx sent to [facility] for nexlizet

## 2020-12-14 NOTE — Telephone Encounter (Signed)
Patient has new insurance and needs PA done  Rx [id number] PCN: CHW ID: [id number] GRP: [id number]  PA submitted for nexlizet

## 2020-12-14 NOTE — Patient Instructions (Signed)
Start amlodipine 5 mg once daily.

## 2020-12-14 NOTE — Telephone Encounter (Signed)
Activated a copay card for patient and was told patient is already enrolled in program.  Replaced copay card with new card.  Bin [id number] PCN [employer]: [id number] ID: [id number]  Card placed at front desk for pick up.

## 2020-12-14 NOTE — Telephone Encounter (Signed)
Pt c/o medication issue:   1. Name of Medication: Nexlizet 180/10 mg    2. How are you currently taking this medication (dosage and times per day)? Not currently taking medication   3. Are you having a reaction (difficulty breathing--STAT)? No   4. What is your medication issue? Kaitlyn Bowman is calling requesting to speak with [name]. She states when she went to the pharmacy to pick up this medication she was told it would cost $400. Kaitlyn Bowman states she was advised at the office it would only cost her $10. She had the pharmacy run her insurance and it only brought the cost down to $200, so she was not able to pick up the medication. Please advise.

## 2020-12-16 ENCOUNTER — Telehealth: Payer: Self-pay | Admitting: Family

## 2020-12-16 NOTE — Telephone Encounter (Signed)
Please advise 

## 2020-12-16 NOTE — Telephone Encounter (Signed)
I recommend that she check her blood pressure if she has a cuff. Cut amlodipine in half starting next dose. Let us know what her Lesly Rubenstein is and if she is not improved tomorrow.

## 2020-12-16 NOTE — Telephone Encounter (Addendum)
PA for Nexlizet approved.Called and detailed VM (per DPR) on pt machine.  Pt called back.She will pick up medication today. Labs rescheduled for 2 months due to delay in getting medication.

## 2020-12-16 NOTE — Telephone Encounter (Signed)
Patient advised to cut pill in half and to check her blood pressure today and tomorrow am.

## 2020-12-16 NOTE — Telephone Encounter (Signed)
Patient states since she started amlodipine she is experiencing drowsiness, dizziness. Would like some advise

## 2020-12-17 ENCOUNTER — Other Ambulatory Visit: Payer: Self-pay | Admitting: Family

## 2020-12-17 ENCOUNTER — Encounter: Payer: Self-pay | Admitting: Family

## 2020-12-17 MED ORDER — LISINOPRIL 10 MG PO TABS: 10.0000 mg | ORAL_TABLET | Freq: Every day | ORAL | 3 refills | Status: DC

## 2021-01-11 ENCOUNTER — Encounter: Payer: Self-pay | Admitting: Family

## 2021-01-11 ENCOUNTER — Other Ambulatory Visit: Payer: Self-pay

## 2021-01-11 ENCOUNTER — Ambulatory Visit (INDEPENDENT_AMBULATORY_CARE_PROVIDER_SITE_OTHER): Payer: 59 | Admitting: Family

## 2021-01-11 VITALS — BP 149/70 | HR 60 | Temp 98.5°F | Resp 16 | Ht 67.0 in | Wt 213.0 lb

## 2021-01-11 DIAGNOSIS — I10 Essential (primary) hypertension: Secondary | ICD-10-CM

## 2021-01-11 DIAGNOSIS — E785 Hyperlipidemia, unspecified: Secondary | ICD-10-CM | POA: Diagnosis not present

## 2021-01-11 DIAGNOSIS — R002 Palpitations: Secondary | ICD-10-CM | POA: Diagnosis not present

## 2021-01-11 LAB — LIPID PANEL
Cholesterol: 153 mg/dL (ref 0–200)
HDL: 38.6 mg/dL — ABNORMAL LOW (ref >39.00)
LDL Cholesterol: 88 mg/dL (ref 0–99)
NonHDL: 114.01
Total CHOL/HDL Ratio: 4
Triglycerides: 132 mg/dL (ref 0.0–149.0)
VLDL: 26.4 mg/dL (ref 0.0–40.0)

## 2021-01-11 NOTE — Patient Instructions (Signed)
Please complete lab work prior to leaving.   

## 2021-01-11 NOTE — Progress Notes (Signed)
Subjective:    Patient ID: Kaitlyn Bowman, female    DOB: December 07, 1960, 60 y.o.   MRN: [id number]  HPI  Patient is a 60 yr old female who presents today for follow up.    HTN- Last visit we started amlodipine. She had dizziness. We changed her amlodipine to lisinopril instead, but she states that she did not tolerate lisinopril either.  It made her feel weak and dizzy. As a result, she has also discontinued lisinopril and instead has been focusing on healthy diet and walking every day.  She does report some palpitations and would like [place] to check an EKG today.    BP Readings from Last 3 Encounters:  01/11/21 (!) 149/70  12/14/20 (!) 166/75  10/26/20 137/90   Hyperlipidemia- stopped Nexlizet as she did not feel well on it. Working hard on diet.    Review of Systems    see HPI  Past Medical History:  Diagnosis Date  . Abnormal TSH   . Alcohol abuse   . Cocaine abuse ([place])    Remote crack cocaine use. UDS negative for cocaine 01/2012  . Coronary artery disease     inferior STEMI 01/13/12 s/p PTCA/DES to mid RCA. Initial EF 45% by cath but improved to 55-60% by echo 01/14/12  . Dyslipidemia    Trig 201, HDL 36, LDL 197 01/2012  . GERD (gastroesophageal reflux disease)   . History of MI (myocardial infarction) 01/2012  . Hypothyroidism   . Myocardial infarction ([place]) 2012  . Umbilical hernia      Social History   Socioeconomic History  . Marital status: Single    Spouse name: Not on file  . Number of children: 1  . Years of education: Not on file  . Highest education level: Not on file  Occupational History  . Occupation: Best boy: [employer]  Tobacco Use  . Smoking status: Former Smoker    Years: 33.00    Types: Cigarettes    Quit date: 06/13/2008    Years since quitting: 12.5  . Smokeless tobacco: Never Used  . Tobacco comment: None since 2009   Substance and Sexual Activity  . Alcohol use: No    Comment: former  . Drug use: No    Types: "Crack" cocaine     Comment: Crack cocaine; none since Jan 2006   . Sexual activity: Not on file  Other Topics Concern  . 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   Enjoys reading, sewing, Fill in puzzles, walking   Completed GED.   No pets.               Social Determinants of Health   Financial Resource Strain: Not on file  Food Insecurity: Not on file  Transportation Needs: Not on file  Physical Activity: Not on file  Stress: Not on file  Social Connections: Not on file  Intimate Partner Violence: Not on file    Past Surgical History:  Procedure Laterality Date  . ABDOMINAL HYSTERECTOMY  1981   partial  . CARDIAC CATHETERIZATION  2010, 2013   Negative. 2013--100% blockage  . HERNIA REPAIR  2017   abd hernia   . LEFT HEART CATHETERIZATION WITH CORONARY ANGIOGRAM N/A 01/12/2012  Procedure: LEFT HEART CATHETERIZATION WITH CORONARY ANGIOGRAM;  Surgeon: Burnell Blanks, MD;  Location: [facility] CATH LAB;  Service: Cardiovascular;  Laterality: N/A;    Family History  Problem Relation Age of Onset  . Heart attack Mother 52       prior pacemaker  . Heart disease Mother   . Diabetes Mother   . Heart failure Father        CHF  . Heart disease Father   . Alcoholism Father   . Stroke Brother     Allergies  Allergen Reactions  . Ciprofloxacin     REACTION: nause-sweating-chest tightness  . Atorvastatin     Myalgia   . Crestor [Rosuvastatin Calcium] Nausea Only  . Livalo [Pitavastatin]     myalgia  . Praluent [Alirocumab]     Muscle aches    Current Outpatient Medications on File Prior to Visit  Medication Sig Dispense Refill  . aspirin EC 81 MG tablet Take 1 tablet (81 mg total) by mouth daily. 90 tablet 3  . betamethasone dipropionate 0.05 % cream Apply topically 2 (two) times daily as needed. 30 g 1   . fluticasone (FLONASE) 50 MCG/ACT nasal spray Place 2 sprays into both nostrils daily. 16 g 1  . nitroGLYCERIN (NITROSTAT) 0.4 MG SL tablet Place 1 tablet (0.4 mg total) under the tongue every 5 (five) minutes x 3 doses as needed for chest pain. 25 tablet 6  . sodium chloride (OCEAN) 0.65 % SOLN nasal spray Place 1 spray into both nostrils as needed for congestion. 15 mL 0  . Bempedoic Acid-Ezetimibe (NEXLIZET) 180-10 MG TABS Take 1 tablet by mouth daily. (Patient not taking: Reported on 01/11/2021) 90 tablet 3   No current facility-administered medications on file prior to visit.    BP (!) 149/70 (BP Location: Left Arm, Patient Position: Sitting, Cuff Size: Large)   Pulse 60   Temp 98.5 F (36.9 C) (Oral)   Resp 16   Ht 5\' 7"  (1.702 m)   Wt 213 lb (96.6 kg)   SpO2 98%   BMI 33.36 kg/m    Objective:   Physical Exam Constitutional:      Appearance: She is well-developed.  Neck:     Thyroid: No thyromegaly.  Cardiovascular:     Rate and Rhythm: Normal rate and regular rhythm.     Heart sounds: Normal heart sounds. No murmur heard.   Pulmonary:     Effort: Pulmonary effort is normal. No respiratory distress.     Breath sounds: Normal breath sounds. No wheezing.  Musculoskeletal:     Cervical back: Neck supple.  Skin:    General: Skin is warm and dry.  Neurological:     Mental Status: She is alert and oriented to person, place, and time.  Psychiatric:        Behavior: Behavior normal.        Thought Content: Thought content normal.        Judgment: Judgment normal.           Assessment & Plan:  HTN- bp actually pretty good today considering that she not on an anti-hypertensive. Will plan to recheck in 3 months.  Palpitations- EKG tracing is personally reviewed.  EKG notes NSR.  No acute changes.  Will forward to cardiologist.   Hyperlipidemia- not taking Nexlizet, would like lipids rechecked.  This visit occurred during the SARS-CoV-2 public health emergency.   Safety protocols were in place, including screening questions prior to the visit, additional usage of  staff PPE, and extensive cleaning of exam room while observing appropriate contact time as indicated for disinfecting solutions.

## 2021-01-16 ENCOUNTER — Encounter: Payer: Self-pay | Admitting: Family

## 2021-01-24 ENCOUNTER — Other Ambulatory Visit: Payer: PRIVATE HEALTH INSURANCE

## 2021-01-25 ENCOUNTER — Other Ambulatory Visit: Payer: Self-pay | Admitting: Pharmacist

## 2021-01-25 ENCOUNTER — Telehealth: Payer: Self-pay | Admitting: Pharmacist

## 2021-01-25 DIAGNOSIS — E782 Mixed hyperlipidemia: Secondary | ICD-10-CM

## 2021-01-25 NOTE — Progress Notes (Signed)
error 

## 2021-01-25 NOTE — Telephone Encounter (Signed)
Contacted patient regarding lipid panel results. As of 01/11/21, LDL 88, TC 153, TG 132 (previously LDL 220, TC 304, TG 222 on 08/30/20). Patient reports trying Nexlizet for 1 week and developed myalgias and could barely walk. Reports since unable to tolerate Nexlizet, she significantly changed her diet. Reports cutting out all bad food, switching pepsi to pepsi diet, and not using salt. Reports now walking every morning for 15 minutes and feels great. LDL not quite at goal <70 mg/dL, however congratulated patient on significantly reducing LDL from 220 to 88 with diet and exercise alone. Encouraged patient to continue to keep up the great work. Patient vebalized understanding and appreciation. Follow-up appointment scheduled with Ermalinda Barrios, PA on 03/22/21.

## 2021-02-03 ENCOUNTER — Telehealth: Payer: Self-pay | Admitting: Cardiovascular Disease

## 2021-02-03 NOTE — Telephone Encounter (Signed)
New Message:      Pt was supposed to have lab work on Monday. She said she had it at her Primary [facility]. She would like for you to review lab work in [employer] and let Dr Angelena Form know please.

## 2021-02-03 NOTE — Telephone Encounter (Signed)
Lipid clinic discussed her lab with results with her on 4/19. I will send to [place]

## 2021-02-08 ENCOUNTER — Other Ambulatory Visit: Payer: 59

## 2021-02-22 ENCOUNTER — Telehealth (INDEPENDENT_AMBULATORY_CARE_PROVIDER_SITE_OTHER): Payer: 59 | Admitting: Family

## 2021-02-22 ENCOUNTER — Other Ambulatory Visit: Payer: Self-pay

## 2021-02-22 DIAGNOSIS — K047 Periapical abscess without sinus: Secondary | ICD-10-CM

## 2021-02-22 MED ORDER — AMOXICILLIN-POT CLAVULANATE 875-125 MG PO TABS: 1.0000 | ORAL_TABLET | Freq: Two times a day (BID) | ORAL | 0 refills | Status: DC

## 2021-02-22 NOTE — Assessment & Plan Note (Signed)
New. Will rx with augmentin 875mg  bid. She is advised to seek dental evaluation as soon as she is able. Pt verbalizes understanding.

## 2021-02-22 NOTE — Progress Notes (Signed)
MyChart Video Visit    Virtual Visit via Video Note   This visit type was conducted due to national recommendations for restrictions regarding the COVID-19 Pandemic (e.g. social distancing) in an effort to limit this patient's exposure and mitigate transmission in our community. This patient is at least at moderate risk for complications without adequate follow up. This format is felt to be most appropriate for this patient at this time. Physical exam was limited by quality of the video and audio technology used for the visit. CMA was able to get the patient set up on a video visit.  Patient location: home Patient and provider in visit Provider location: Office  I discussed the limitations of evaluation and management by telemedicine and the availability of in person appointments. The patient expressed understanding and agreed to proceed.  Visit Date: 02/22/2021  Today's healthcare provider: Nance Pear, NP     Subjective:    Patient ID: Kaitlyn Bowman, female    DOB: 1961-04-09, 60 y.o.   MRN: [id number]  Chief Complaint  Patient presents with  . Dental Pain    Complains of tooth pain    HPI Patient is in today for complaint of left upper posterior tooth pain.  States that tooth has been broken off for some time, but began to hurt 2 weeks ago. She signed up for dental insurance but states that her plan will not be active until January. She called some dentists and they want $400 to treat her which she does not have. She is working with her insurance agent to see if they can get her insurance activated sooner.    Past Medical History:  Diagnosis Date  . Abnormal TSH   . Alcohol abuse   . Cocaine abuse ([place])    Remote crack cocaine use. UDS negative for cocaine 01/2012  . Coronary artery disease     inferior STEMI 01/13/12 s/p PTCA/DES to mid RCA. Initial EF 45% by cath but improved to 55-60% by echo 01/14/12  . Dyslipidemia    Trig 201, HDL 36, LDL 197 01/2012  . GERD  (gastroesophageal reflux disease)   . History of MI (myocardial infarction) 01/2012  . Hypothyroidism   . Myocardial infarction ([place]) 2012  . Umbilical hernia     Past Surgical History:  Procedure Laterality Date  . ABDOMINAL HYSTERECTOMY  1981   partial  . CARDIAC CATHETERIZATION  2010, 2013   Negative. 2013--100% blockage  . HERNIA REPAIR  2017   abd hernia   . LEFT HEART CATHETERIZATION WITH CORONARY ANGIOGRAM N/A 01/12/2012   Procedure: LEFT HEART CATHETERIZATION WITH CORONARY ANGIOGRAM;  Surgeon: Burnell Blanks, MD;  Location: [facility] C[facility] LAB;  Service: Cardiovascular;  Laterality: N/A;    Family History  Problem Relation Age of Onset  . Heart attack Mother 22       prior pacemaker  . Heart disease Mother   . Diabetes Mother   . Heart failure Father        CHF  . Heart disease Father   . Alcoholism Father   . Stroke Brother     Social History   Socioeconomic History  . Marital status: Single    Spouse name: Not on file  . Number of children: 1  . Years of education: Not on file  . Highest education level: GED or equivalent  Occupational History  . Occupation: Best boy: [employer]  Tobacco Use  . Smoking status: Former Smoker  Years: 33.00    Types: Cigarettes    Quit date: 06/13/2008    Years since quitting: 12.7  . Smokeless tobacco: Never Used  . Tobacco comment: None since 2009   Substance and Sexual Activity  . Alcohol use: No    Comment: former  . Drug use: No    Types: "Crack" cocaine    Comment: Crack cocaine; none since Jan 2006   . Sexual activity: Not on file  Other Topics Concern  . 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   Enjoys reading, sewing, Fill in puzzles, walking   Completed GED.   No pets.               Social Determinants of Health    Financial Resource Strain: Low Risk   . Difficulty of Paying Living Expenses: Not very hard  Food Insecurity: No Food Insecurity  . Worried About Charity fundraiser in the Last Year: Never true  . Ran Out of Food in the Last Year: Never true  Transportation Needs: No Transportation Needs  . Lack of Transportation (Medical): No  . Lack of Transportation (Non-Medical): No  Physical Activity: Insufficiently Active  . Days of Exercise per Week: 4 days  . Minutes of Exercise per Session: 20 min  Stress: No Stress Concern Present  . Feeling of Stress : Not at all  Social Connections: Moderately Isolated  . Frequency of Communication with Friends and Family: More than three times a week  . Frequency of Social Gatherings with Friends and Family: Twice a week  . Attends Religious Services: More than 4 times per year  . Active Member of Clubs or Organizations: No  . Attends Archivist Meetings: Not on file  . Marital Status: Never married  Intimate Partner Violence: Not on file    Outpatient Medications Prior to Visit  Medication Sig Dispense Refill  . aspirin EC 81 MG tablet Take 1 tablet (81 mg total) by mouth daily. 90 tablet 3  . betamethasone dipropionate 0.05 % cream Apply topically 2 (two) times daily as needed. 30 g 1  . fluticasone (FLONASE) 50 MCG/ACT nasal spray Place 2 sprays into both nostrils daily. 16 g 1  . nitroGLYCERIN (NITROSTAT) 0.4 MG SL tablet Place 1 tablet (0.4 mg total) under the tongue every 5 (five) minutes x 3 doses as needed for chest pain. 25 tablet 6  . sodium chloride (OCEAN) 0.65 % SOLN nasal spray Place 1 spray into both nostrils as needed for congestion. 15 mL 0   No facility-administered medications prior to visit.    Allergies  Allergen Reactions  . Ciprofloxacin     REACTION: nause-sweating-chest tightness  . Atorvastatin     Myalgia   . Crestor [Rosuvastatin Calcium] Nausea Only  . Nexlizet [Bempedoic Acid-Ezetimibe] Other (See  Comments)    myalgia  . Livalo [Pitavastatin]     myalgia  . Praluent [Alirocumab]     Muscle aches    ROS     Objective:    Physical Exam Constitutional:      Appearance: Normal appearance.     Comments: Appears uncomfortable  HENT:     Head:     Comments: No facial swelling noted Neurological:  Mental Status: She is alert.     There were no vitals taken for this visit. Wt Readings from Last 3 Encounters:  01/11/21 213 lb (96.6 kg)  12/14/20 215 lb (97.5 kg)  10/26/20 213 lb (96.6 kg)    Diabetic Foot Exam - Simple   No data filed    Lab Results  Component Value Date   WBC 8.7 06/01/2020   HGB 13.0 06/01/2020   HCT 40.3 06/01/2020   PLT 310 06/01/2020   GLUCOSE 69 06/01/2020   CHOL 153 01/11/2021   TRIG 132.0 01/11/2021   HDL 38.60 (L) 01/11/2021   LDLDIRECT 76 12/03/2018   LDLCALC 88 01/11/2021   ALT 22 06/01/2020   AST 19 06/01/2020   NA 139 06/01/2020   K 4.4 06/01/2020   CL 105 06/01/2020   CREATININE 0.74 06/01/2020   BUN 11 06/01/2020   CO2 22 06/01/2020   TSH 3.87 06/01/2020   INR 2.10 (H) 01/12/2012   HGBA1C 5.9 (H) 07/19/2018    Lab Results  Component Value Date   TSH 3.87 06/01/2020   Lab Results  Component Value Date   WBC 8.7 06/01/2020   HGB 13.0 06/01/2020   HCT 40.3 06/01/2020   MCV 79.6 (L) 06/01/2020   PLT 310 06/01/2020   Lab Results  Component Value Date   NA 139 06/01/2020   K 4.4 06/01/2020   CO2 22 06/01/2020   GLUCOSE 69 06/01/2020   BUN 11 06/01/2020   CREATININE 0.74 06/01/2020   BILITOT 0.3 06/01/2020   ALKPHOS 74 11/18/2018   AST 19 06/01/2020   ALT 22 06/01/2020   PROT 7.6 06/01/2020   ALBUMIN 4.3 11/18/2018   CALCIUM 9.5 06/01/2020   ANIONGAP 7 08/17/2016   GFR 74.86 11/18/2018   Lab Results  Component Value Date   CHOL 153 01/11/2021   Lab Results  Component Value Date   HDL 38.60 (L) 01/11/2021   Lab Results  Component Value Date   LDLCALC 88 01/11/2021   Lab Results  Component  Value Date   TRIG 132.0 01/11/2021   Lab Results  Component Value Date   CHOLHDL 4 01/11/2021   Lab Results  Component Value Date   HGBA1C 5.9 (H) 07/19/2018       Assessment & Plan:   Problem List Items Addressed This Visit   None     I am having Kaitlyn Bowman maintain her aspirin EC, nitroGLYCERIN, sodium chloride, fluticasone, and betamethasone dipropionate.  No orders of the defined types were placed in this encounter.   I discussed the assessment and treatment plan with the patient. The patient was provided an opportunity to ask questions and all were answered. The patient agreed with the plan and demonstrated an understanding of the instructions.   The patient was advised to call back or seek an in-person evaluation if the symptoms worsen or if the condition fails to improve as anticipated. Nance Pear, NP [employer] [phone] (phone) [phone] (fax)  [place]

## 2021-02-28 ENCOUNTER — Telehealth: Payer: Self-pay | Admitting: Cardiovascular Disease

## 2021-02-28 ENCOUNTER — Telehealth: Payer: Self-pay | Admitting: Physician Assistant

## 2021-02-28 NOTE — Telephone Encounter (Signed)
   [facility] Pre-operative Risk Assessment    Patient Name: [place]: - Please ensure there is not already an duplicate clearance open for this procedure. - Under Visit Info/Reason for Call, type in Other and utilize the format Clearance MM/DD/YY or Clearance TBD. Do not use dashes or single digits. - If request is for dental extraction, please clarify the # of teeth to be extracted.  Request for surgical clearance:  1. What type of surgery is being performed? Tooth extraction (one tooth #14)  2. When is this surgery scheduled? 02/28/21  3. What type of clearance is required (medical clearance vs. Pharmacy clearance to hold med vs. Both)? Pharmacy   4. Are there any medications that need to be held prior to surgery and how long? no  5. Practice name and name of physician performing surgery? [facility]   6. What is the office phone number? [phone] option 2    7.   What is the office fax number? [phone]  8.   Anesthesia type (None, local, MAC, general) ? Requesting cardiology office advise   Kaitlyn J Martinique 02/28/2021, 1:41 PM  _________________________________________________________________   (provider comments below)

## 2021-02-28 NOTE — Telephone Encounter (Addendum)
   Patient Name:  Kaitlyn Bowman  DOB:  01/21/1961  MRN:  [id number]   Primary Cardiologist: Lauree Chandler, MD  Chart reviewed as part of pre-operative protocol coverage.   Simple dental extractions are considered low risk procedures per guidelines and generally do not require any specific cardiac clearance. It is also generally accepted that for simple extractions and dental cleanings, there is no need to interrupt blood thinner therapy.  Recommendations  Proceed with single dental extraction.  Continue aspirin without interruption.  SBE prophylaxis is not required for the patient from a cardiac standpoint.  There is no contraindication (from a cardiac standpoint) to use local anesthesia with epinephrine.  Please call with questions. Richardson Dopp, PA-C 02/28/2021, 2:17 PM

## 2021-02-28 NOTE — Telephone Encounter (Signed)
Notes faxed to surgeon. This phone note will be removed from the preop pool. Richardson Dopp, PA-C  02/28/2021 2:20 PM

## 2021-03-08 NOTE — Progress Notes (Signed)
Cardiology Office Note    Date:  03/22/2021   ID:  Kaitlyn Bowman, DOB 1960/11/13, MRN [id number]   PCP:  [name], NP   [facility]  Cardiologist:  Lauree Chandler, MD  Advanced Practice Provider:  No care team member to display Electrophysiologist:  None   [phone]   Chief Complaint  Patient presents with   Follow-up     History of Present Illness:  Kaitlyn Bowman is a 60 y.o. female with history of CAD status post inferior STEMI 2013 treated with DES to the mid RCA, echo 2016 normal LV function, no ischemia on stress test 2016, hyperlipidemia.  Patient last saw Dr. Angelena Form 08/30/2020 at which time she had stopped Praluent due to lack of insurance and muscle aches and was referred back to lipid clinic.  Patient comes in for f/u. In April her BP was high and was put on something that made her very dizzy. She stopped it and starting walking 1/2 mile daily-she lost 15 lbs. Her BP is running 130/80. LDL 88 in April. She says she can't tolerate Praluent. Denies chest pain, dyspnea, dizziness, edema.      Past Medical History:  Diagnosis Date   Abnormal TSH    Alcohol abuse    Cocaine abuse ([place])    Remote crack cocaine use. UDS negative for cocaine 01/2012   Coronary artery disease     inferior STEMI 01/13/12 s/p PTCA/DES to mid RCA. Initial EF 45% by cath but improved to 55-60% by echo 01/14/12   Dyslipidemia    Trig 201, HDL 36, LDL 197 01/2012   GERD (gastroesophageal reflux disease)    History of MI (myocardial infarction) 01/2012   Hypothyroidism    Myocardial infarction [facility]) 2010   Umbilical hernia     Past Surgical History:  Procedure Laterality Date   ABDOMINAL HYSTERECTOMY  1981   partial   CARDIAC CATHETERIZATION  2010, 2013   Negative. 2013--100% blockage   HERNIA REPAIR  2017   abd hernia    LEFT HEART CATHETERIZATION WITH CORONARY ANGIOGRAM N/A 01/12/2012   Procedure: LEFT HEART CATHETERIZATION WITH CORONARY  ANGIOGRAM;  Surgeon: Burnell Blanks, MD;  Location: [facility] CATH LAB;  Service: Cardiovascular;  Laterality: N/A;    Current Medications: Current Meds  Medication Sig   amoxicillin-clavulanate (AUGMENTIN) 875-125 MG tablet Take 1 tablet by mouth 2 (two) times daily.   aspirin EC 81 MG tablet Take 1 tablet (81 mg total) by mouth daily.   betamethasone dipropionate 0.05 % cream Apply topically 2 (two) times daily as needed.   fluticasone (FLONASE) 50 MCG/ACT nasal spray Place 2 sprays into both nostrils daily.   nitroGLYCERIN (NITROSTAT) 0.4 MG SL tablet Place 1 tablet (0.4 mg total) under the tongue every 5 (five) minutes x 3 doses as needed for chest pain.   sodium chloride (OCEAN) 0.65 % SOLN nasal spray Place 1 spray into both nostrils as needed for congestion.     Allergies:   Ciprofloxacin, Atorvastatin, Crestor [rosuvastatin calcium], Nexlizet [bempedoic acid-ezetimibe], Livalo [pitavastatin], and Praluent [alirocumab]   Social History   Socioeconomic History   Marital status: Single    Spouse name: Not on file   Number of children: 1   Years of education: Not on file   Highest education level: GED or equivalent  Occupational History   Occupation: Best boy: [employer]  Tobacco Use   Smoking status: Former    Years: 33.00  Pack years: 0.00    Types: Cigarettes    Quit date: 06/13/2008    Years since quitting: 12.7   Smokeless tobacco: Never   Tobacco comments:    None since 2009   Substance and Sexual Activity   Alcohol use: No    Comment: former   Drug use: No    Types: "Crack" cocaine    Comment: Crack cocaine; none since Jan 2006    Sexual activity: Not on file  Other Topics Concern   Not on file  Social History Narrative   Regular exercise:  Walks 5-7 x weekly   Caffeine use:  1 daily   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   Enjoys reading, sewing, Fill in puzzles,  walking   Completed GED.   No pets.               Social Determinants of Health   Financial Resource Strain: Low Risk    Difficulty of Paying Living Expenses: Not very hard  Food Insecurity: No Food Insecurity   Worried About Charity fundraiser in the Last Year: Never true   Ran Out of Food in the Last Year: Never true  Transportation Needs: No Transportation Needs   Lack of Transportation (Medical): No   Lack of Transportation (Non-Medical): No  Physical Activity: Insufficiently Active   Days of Exercise per Week: 4 days   Minutes of Exercise per Session: 20 min  Stress: No Stress Concern Present   Feeling of Stress : Not at all  Social Connections: Moderately Isolated   Frequency of Communication with Friends and Family: More than three times a week   Frequency of Social Gatherings with Friends and Family: Twice a week   Attends Religious Services: More than 4 times per year   Active Member of [employer] or Organizations: No   Attends Music therapist: Not on file   Marital Status: Never married     Family History:  The patient's  family history includes Alcoholism in her father; Diabetes in her mother; Heart attack (age of onset: 41) in her mother; Heart disease in her father and mother; Heart failure in her father; Stroke in her brother.   ROS:   Please see the history of present illness.    ROS All other systems reviewed and are negative.   PHYSICAL EXAM:   VS:  BP 140/84   Pulse 74   Ht 5\' 7"  (1.702 m)   Wt 207 lb 6.4 oz (94.1 kg)   SpO2 94%   BMI 32.48 kg/m   Physical Exam  GEN: Obese, in no acute distress  Neck: no JVD, carotid bruits, or masses Cardiac:RRR; no murmurs, rubs, or gallops  Respiratory:  clear to auscultation bilaterally, normal work of breathing GI: soft, nontender, nondistended, + BS Ext: without cyanosis, clubbing, or edema, Good distal pulses bilaterally Neuro:  Alert and Oriented x 3, Psych: euthymic mood, full affect  Wt  Readings from Last 3 Encounters:  03/22/21 207 lb 6.4 oz (94.1 kg)  01/11/21 213 lb (96.6 kg)  12/14/20 215 lb (97.5 kg)      Studies/Labs Reviewed:   EKG:  EKG is not ordered today.     Recent Labs: 06/01/2020: ALT 22; BUN 11; Creat 0.74; Hemoglobin 13.0; Platelets 310; Potassium 4.4; Sodium 139; TSH 3.87   Lipid Panel    Component Value Date/Time  CHOL 153 01/11/2021 1133   CHOL 304 (H) 08/30/2020 1545   TRIG 132.0 01/11/2021 1133   HDL 38.60 (L) 01/11/2021 1133   HDL 40 08/30/2020 1545   CHOLHDL 4 01/11/2021 1133   VLDL 26.4 01/11/2021 1133   LDLCALC 88 01/11/2021 1133   LDLCALC 220 (H) 08/30/2020 1545   LDLCALC 187 (H) 06/01/2020 1500   LDLDIRECT 76 12/03/2018 0727   LDLDIRECT 63.0 07/29/2018 0713    Additional studies/ records that were reviewed today include:    Cardiac cath 01/12/12: Left main: No obstructive disease.  Left Anterior Descending Artery: Large vessel that courses to the apex. Mild luminal irregularities in the mid vessel. Moderate sized diagonal branch with no disease.  Circumflex Artery: Large caliber vessel with serial 20% lesions in the mid vessel. OM1 is moderate sized with no disease. The second and third marginals are small. The fourth OM branch is moderate sized and has a 30% proximal stenosis.  Right Coronary Artery: Dominant vessel with 100% mid occlusion.  Left Ventricular Angiogram: LVEF 45%. Inferior wall hypokinesis.     Echo 02/15/15: - Left ventricle: The cavity size was normal. Systolic function was   normal. The estimated ejection fraction was in the range of 55%   to 60%. Wall motion was normal; there were no regional wall   motion abnormalities. Left ventricular diastolic function   parameters were normal. - Aortic valve: There was trivial regurgitation. - Mitral valve: There was mild to moderate regurgitation directed   centrally. - Left atrium: The atrium was mildly dilated.    Risk Assessment/Calculations:          ASSESSMENT:    1. Coronary artery disease involving native coronary artery of native heart without angina pectoris   2. Hyperlipidemia, unspecified hyperlipidemia type   3. Essential hypertension      PLAN:  In order of problems listed above:   CAD status post inferior STEMI 2013 treated with DES to the mid RCA, echo 2016 normal LV function, no ischemia on stress test 2016-only on ASA.  Statin intolerant. Exercising daily. No angina.  Hyperlipidemia- LDL88 in 01/2021-won't take Praluent or Repatha-says it made her sick  HTN-recent diagnosis but brought it down with walking. Didn't tolerate lisinopril. 2 gm sodium diet   Shared Decision Making/Informed Consent        Medication Adjustments/Labs and Tests Ordered: Current medicines are reviewed at length with the patient today.  Concerns regarding medicines are outlined above.  Medication changes, Labs and Tests ordered today are listed in the Patient Instructions below. Patient Instructions  Medication Instructions:  Your physician recommends that you continue on your current medications as directed. Please refer to the Current Medication list given to you today.  *If you need a refill on your cardiac medications before your next appointment, please call your pharmacy*   Lab Work: None If you have labs (blood work) drawn today and your tests are completely normal, you will receive your results only by: [place] (if you have MyChart) OR A paper copy in the mail If you have any lab test that is abnormal or we need to change your treatment, we will call you to review the results.   Follow-Up: At [facility], you and your health needs are our priority.  As part of our continuing mission to provide you with exceptional heart care, we have created designated Provider Care Teams.  These Care Teams include your primary Cardiologist (physician) and Advanced Practice Providers (APPs -  Physician Assistants  and Nurse  Practitioners) who all work together to provide you with the care you need, when you need it.  Your next appointment:   1 year(s)  The format for your next appointment:   In Person  Provider:   You may see Lauree Chandler, MD or one of the following Advanced Practice Providers on your designated Care Team:   Melina Copa, PA-C Ermalinda Barrios, PA-C   Other Instructions Two Gram Sodium Diet 2000 mg  What is Sodium? Sodium is a mineral found naturally in many foods. The most significant source of sodium in the diet is table salt, which is about 40% sodium.  Processed, convenience, and preserved foods also contain a large amount of sodium.  The body needs only 500 mg of sodium daily to function,  A normal diet provides more than enough sodium even if you do not use salt.  Why Limit Sodium? A build up of sodium in the body can cause thirst, increased blood pressure, shortness of breath, and water retention.  Decreasing sodium in the diet can reduce edema and risk of heart attack or stroke associated with high blood pressure.  Keep in mind that there are many other factors involved in these health problems.  Heredity, obesity, lack of exercise, cigarette smoking, stress and what you eat all play a role.  General Guidelines: Do not add salt at the table or in cooking.  One teaspoon of salt contains over 2 grams of sodium. Read food labels Avoid processed and convenience foods Ask your dietitian before eating any foods not dicussed in the menu planning guidelines Consult your physician if you wish to use a salt substitute or a sodium containing medication such as antacids.  Limit milk and milk products to 16 oz (2 cups) per day.  Shopping Hints: READ LABELS!! "Dietetic" does not necessarily mean low sodium. Salt and other sodium ingredients are often added to foods during processing.    Menu Planning Guidelines Food Group Choose More Often Avoid  Beverages (see also the milk group All  fruit juices, low-sodium, salt-free vegetables juices, low-sodium carbonated beverages Regular vegetable or tomato juices, commercially softened water used for drinking or cooking  Breads and Cereals Enriched white, wheat, rye and pumpernickel bread, hard rolls and dinner rolls; muffins, cornbread and waffles; most dry cereals, cooked cereal without added salt; unsalted crackers and breadsticks; low sodium or homemade bread crumbs Bread, rolls and crackers with salted tops; quick breads; instant hot cereals; pancakes; commercial bread stuffing; self-rising flower and biscuit mixes; regular bread crumbs or cracker crumbs  Desserts and Sweets Desserts and sweets mad with mild should be within allowance Instant pudding mixes and cake mixes  Fats Butter or margarine; vegetable oils; unsalted salad dressings, regular salad dressings limited to 1 Tbs; light, sour and heavy cream Regular salad dressings containing bacon fat, bacon bits, and salt pork; snack dips made with instant soup mixes or processed cheese; salted nuts  Fruits Most fresh, frozen and canned fruits Fruits processed with salt or sodium-containing ingredient (some dried fruits are processed with sodium sulfites        Vegetables Fresh, frozen vegetables and low- sodium canned vegetables Regular canned vegetables, sauerkraut, pickled vegetables, and others prepared in brine; frozen vegetables in sauces; vegetables seasoned with ham, bacon or salt pork  Condiments, Sauces, Miscellaneous  Salt substitute with physician's approval; pepper, herbs, spices; vinegar, lemon or lime juice; hot pepper sauce; garlic powder, onion powder, low sodium soy sauce (1 Tbs.); low sodium condiments (ketchup,  chili sauce, mustard) in limited amounts (1 tsp.) fresh ground horseradish; unsalted tortilla chips, pretzels, potato chips, popcorn, salsa (1/4 cup) Any seasoning made with salt including garlic salt, celery salt, onion salt, and seasoned salt; sea salt, rock  salt, kosher salt; meat tenderizers; monosodium glutamate; mustard, regular soy sauce, barbecue, sauce, chili sauce, teriyaki sauce, steak sauce, Worcestershire sauce, and most flavored vinegars; canned gravy and mixes; regular condiments; salted snack foods, olives, picles, relish, horseradish sauce, catsup   Food preparation: Try these seasonings Meats:    Pork Sage, onion Serve with applesauce  Chicken Poultry seasoning, thyme, parsley Serve with cranberry sauce  Lamb Curry powder, rosemary, garlic, thyme Serve with mint sauce or jelly  Veal Marjoram, basil Serve with current jelly, cranberry sauce  Beef Pepper, bay leaf Serve with dry mustard, unsalted chive butter  Fish [place] leaf, dill Serve with unsalted lemon butter, unsalted parsley butter  Vegetables:    Asparagus Lemon juice   Broccoli Lemon juice   Carrots Mustard dressing parsley, mint, nutmeg, glazed with unsalted butter and sugar   Green beans Marjoram, lemon juice, nutmeg,dill seed   Tomatoes Basil, marjoram, onion   Spice /blend for [employer]" 4 tsp ground thyme 1 tsp ground sage 3 tsp ground rosemary 4 tsp ground marjoram   Test your knowledge A product that says "Salt Free" may still contain sodium. True or False Garlic Powder and Hot Pepper Sauce an be used as alternative seasonings.True or False Processed foods have more sodium than fresh foods.  True or False Canned Vegetables have less sodium than froze True or False   WAYS TO DECREASE YOUR SODIUM INTAKE Avoid the use of added salt in cooking and at the table.  Table salt (and other prepared seasonings which contain salt) is probably one of the greatest sources of sodium in the diet.  Unsalted foods can gain flavor from the sweet, sour, and butter taste sensations of herbs and spices.  Instead of using salt for seasoning, try the following seasonings with the foods listed.  Remember: how you use them to enhance natural food flavors is limited only by your  creativity... Allspice-Meat, fish, eggs, fruit, peas, red and yellow vegetables Almond Extract-Fruit baked goods Anise Seed-Sweet breads, fruit, carrots, beets, cottage cheese, cookies (tastes like licorice) Basil-Meat, fish, eggs, vegetables, rice, vegetables salads, soups, sauces Bay Leaf-Meat, fish, stews, poultry Burnet-Salad, vegetables (cucumber-like flavor) Caraway Seed-Bread, cookies, cottage cheese, meat, vegetables, cheese, rice Cardamon-Baked goods, fruit, soups Celery Powder or seed-Salads, salad dressings, sauces, meatloaf, soup, bread.Do not use  celery salt Chervil-Meats, salads, fish, eggs, vegetables, cottage cheese (parsley-like flavor) Chili Power-Meatloaf, chicken cheese, corn, eggplant, egg dishes Chives-Salads cottage cheese, egg dishes, soups, vegetables, sauces Cilantro-Salsa, casseroles Cinnamon-Baked goods, fruit, pork, lamb, chicken, carrots Cloves-Fruit, baked goods, fish, pot roast, green beans, beets, carrots Coriander-Pastry, cookies, meat, salads, cheese (lemon-orange flavor) Cumin-Meatloaf, fish,cheese, eggs, cabbage,fruit pie (caraway flavor) [employer], fruit, eggs, fish, poultry, cottage cheese, vegetables Dill Seed-Meat, cottage cheese, poultry, vegetables, fish, salads, bread Fennel Seed-Bread, cookies, apples, pork, eggs, fish, beets, cabbage, cheese, Licorice-like flavor Garlic-(buds or powder) Salads, meat, poultry, fish, bread, butter, vegetables, potatoes.Do not  use garlic salt Ginger-Fruit, vegetables, baked goods, meat, fish, poultry Horseradish Root-Meet, vegetables, butter Lemon Juice or Extract-Vegetables, fruit, tea, baked goods, fish salads Mace-Baked goods fruit, vegetables, fish, poultry (taste like nutmeg) Maple Extract-Syrups Marjoram-Meat, chicken, fish, vegetables, breads, green salads (taste like Sage) Mint-Tea, lamb, sherbet, vegetables, desserts, carrots, cabbage Mustard, Dry or Seed-Cheese, eggs, meats, vegetables,  poultry  Nutmeg-Baked goods, fruit, chicken, eggs, vegetables, desserts Onion Powder-Meat, fish, poultry, vegetables, cheese, eggs, bread, rice salads (Do not use   Onion salt) Orange Extract-Desserts, baked goods Oregano-Pasta, eggs, cheese, onions, pork, lamb, fish, chicken, vegetables, green salads Paprika-Meat, fish, poultry, eggs, cheese, vegetables Parsley Flakes-Butter, vegetables, meat fish, poultry, eggs, bread, salads (certain forms may   Contain sodium Pepper-Meat fish, poultry, vegetables, eggs Peppermint Extract-Desserts, baked goods Poppy Seed-Eggs, bread, cheese, fruit dressings, baked goods, noodles, vegetables, cottage  [employer], poultry, meat, fish, cauliflower, turnips,eggs bread Saffron-Rice, bread, veal, chicken, fish, eggs Sage-Meat, fish, poultry, onions, eggplant, tomateos, pork, stews Savory-Eggs, salads, poultry, meat, rice, vegetables, soups, pork Tarragon-Meat, poultry, fish, eggs, butter, vegetables (licorice-like flavor)  Thyme-Meat, poultry, fish, eggs, vegetables, (clover-like flavor), sauces, soups Tumeric-Salads, butter, eggs, fish, rice, vegetables (saffron-like flavor) Vanilla Extract-Baked goods, candy Vinegar-Salads, vegetables, meat marinades Walnut Extract-baked goods, candy   2. Choose your Foods Wisely   The following is a list of foods to avoid which are high in sodium:  Meats-Avoid all smoked, canned, salt cured, dried and kosher meat and fish as well as Anchovies   [employer] meats:Bologna, Liverwurst, Pastrami Canned meat or fish  Marinated herring Caviar    Pepperoni Corned Beef   Pizza Dried chipped beef  Salami Frozen breaded fish or meat Salt pork Frankfurters or hot dogs  Sardines Gefilte fish   Sausage Ham (boiled ham, Proscuitto Smoked butt    spiced ham)   Spam      TV Dinners Vegetables Canned vegetables (Regular) Relish Canned mushrooms  Sauerkraut Olives    Tomato  juice Pickles  Bakery and Dessert Products Canned puddings  Cream pies Cheesecake   Decorated cakes Cookies  Beverages/Juices Tomato juice, regular  Gatorade   V-8 vegetable juice, regular  Breads and Cereals Biscuit mixes   Salted potato chips, corn chips, pretzels Bread stuffing mixes  Salted crackers and rolls Pancake and waffle mixes Self-rising flour  Seasonings Accent    Meat sauces Barbecue sauce  Meat tenderizer Catsup    Monosodium glutamate (MSG) Celery salt   Onion salt Chili sauce   Prepared mustard Garlic salt   Salt, seasoned salt, sea salt Gravy mixes   Soy sauce Horseradish   Steak sauce Ketchup   Tartar sauce Lite salt    Teriyaki sauce Marinade mixes   Worcestershire sauce  Others Baking powder   Cocoa and cocoa mixes Baking soda   Commercial casserole mixes Candy-caramels, chocolate  Dehydrated soups    Bars, fudge,nougats  Instant rice and pasta mixes Canned broth or soup  Maraschino cherries Cheese, aged and processed cheese and cheese spreads  Learning Assessment Quiz  Indicated T (for True) or F (for False) for each of the following statements:  _____ Fresh fruits and vegetables and unprocessed grains are generally low in sodium _____ Water may contain a considerable amount of sodium, depending on the source _____ You can always tell if a food is high in sodium by tasting it _____ Certain laxatives my be high in sodium and should be avoided unless prescribed   by a physician or pharmacist _____ Salt substitutes may be used freely by anyone on a sodium restricted diet _____ Sodium is present in table salt, food additives and as a natural component of   most foods _____ Table salt is approximately 90% sodium _____ Limiting sodium intake may help prevent excess fluid accumulation in the body _____ On a sodium-restricted diet, seasonings such as bouillon  soy sauce, and    cooking wine should be used in place of table salt _____ On an ingredient  list, a product which lists monosodium glutamate as the first   ingredient is an appropriate food to include on a low sodium diet  Circle the best answer(s) to the following statements (Hint: there may be more than one correct answer)  11. On a low-sodium diet, some acceptable snack items are:    A. Olives  F. Bean dip   K. Grapefruit juice    B. Salted Pretzels G. Commercial Popcorn   L. Canned peaches    C. Carrot Sticks  H. Bouillon   M. Unsalted nuts   D. [place] fries  I. Peanut butter crackers N. Salami   E. Sweet pickles J. Tomato Juice   O. Pizza  12.  Seasonings that may be used freely on a reduced - sodium diet include   A. Lemon wedges F.Monosodium glutamate K. Celery seed    B.Soysauce   G. Pepper   L. Mustard powder   C. Sea salt  H. Cooking wine  M. Onion flakes   D. Vinegar  E. Prepared horseradish N. Salsa   E. Sage   J. Worcestershire sauce  O. [address]     Sumner Boast, PA-C  03/22/2021 12:31 PM    [facility] [address], [place], [place]  [address] Phone: [phone]; Fax: [phone]

## 2021-03-22 ENCOUNTER — Other Ambulatory Visit: Payer: Self-pay

## 2021-03-22 ENCOUNTER — Ambulatory Visit (INDEPENDENT_AMBULATORY_CARE_PROVIDER_SITE_OTHER): Payer: 59 | Admitting: Physician Assistant

## 2021-03-22 ENCOUNTER — Encounter: Payer: Self-pay | Admitting: Physician Assistant

## 2021-03-22 VITALS — BP 140/84 | HR 74 | Ht 67.0 in | Wt 207.4 lb

## 2021-03-22 DIAGNOSIS — I2510 Atherosclerotic heart disease of native coronary artery without angina pectoris: Secondary | ICD-10-CM | POA: Diagnosis not present

## 2021-03-22 DIAGNOSIS — I10 Essential (primary) hypertension: Secondary | ICD-10-CM | POA: Diagnosis not present

## 2021-03-22 DIAGNOSIS — E785 Hyperlipidemia, unspecified: Secondary | ICD-10-CM

## 2021-03-22 NOTE — Patient Instructions (Signed)
Medication Instructions:  Your physician recommends that you continue on your current medications as directed. Please refer to the Current Medication list given to you today.  *If you need a refill on your cardiac medications before your next appointment, please call your pharmacy*   Lab Work: None If you have labs (blood work) drawn today and your tests are completely normal, you will receive your results only by: [place] (if you have MyChart) OR A paper copy in the mail If you have any lab test that is abnormal or we need to change your treatment, we will call you to review the results.   Follow-Up: At [facility], you and your health needs are our priority.  As part of our continuing mission to provide you with exceptional heart care, we have created designated Provider Care Teams.  These Care Teams include your primary Cardiologist (physician) and Advanced Practice Providers (APPs -  Physician Assistants and Nurse Practitioners) who all work together to provide you with the care you need, when you need it.  Your next appointment:   1 year(s)  The format for your next appointment:   In Person  Provider:   You may see Lauree Chandler, MD or one of the following Advanced Practice Providers on your designated Care Team:   Melina Copa, PA-C Ermalinda Barrios, PA-C   Other Instructions Two Gram Sodium Diet 2000 mg  What is Sodium? Sodium is a mineral found naturally in many foods. The most significant source of sodium in the diet is table salt, which is about 40% sodium.  Processed, convenience, and preserved foods also contain a large amount of sodium.  The body needs only 500 mg of sodium daily to function,  A normal diet provides more than enough sodium even if you do not use salt.  Why Limit Sodium? A build up of sodium in the body can cause thirst, increased blood pressure, shortness of breath, and water retention.  Decreasing sodium in the diet can reduce edema and  risk of heart attack or stroke associated with high blood pressure.  Keep in mind that there are many other factors involved in these health problems.  Heredity, obesity, lack of exercise, cigarette smoking, stress and what you eat all play a role.  General Guidelines: Do not add salt at the table or in cooking.  One teaspoon of salt contains over 2 grams of sodium. Read food labels Avoid processed and convenience foods Ask your dietitian before eating any foods not dicussed in the menu planning guidelines Consult your physician if you wish to use a salt substitute or a sodium containing medication such as antacids.  Limit milk and milk products to 16 oz (2 cups) per day.  Shopping Hints: READ LABELS!! "Dietetic" does not necessarily mean low sodium. Salt and other sodium ingredients are often added to foods during processing.    Menu Planning Guidelines Food Group Choose More Often Avoid  Beverages (see also the milk group All fruit juices, low-sodium, salt-free vegetables juices, low-sodium carbonated beverages Regular vegetable or tomato juices, commercially softened water used for drinking or cooking  Breads and Cereals Enriched white, wheat, rye and pumpernickel bread, hard rolls and dinner rolls; muffins, cornbread and waffles; most dry cereals, cooked cereal without added salt; unsalted crackers and breadsticks; low sodium or homemade bread crumbs Bread, rolls and crackers with salted tops; quick breads; instant hot cereals; pancakes; commercial bread stuffing; self-rising flower and biscuit mixes; regular bread crumbs or cracker crumbs  Desserts and  Sweets Desserts and sweets mad with mild should be within allowance Instant pudding mixes and cake mixes  Fats Butter or margarine; vegetable oils; unsalted salad dressings, regular salad dressings limited to 1 Tbs; light, sour and heavy cream Regular salad dressings containing bacon fat, bacon bits, and salt pork; snack dips made with instant  soup mixes or processed cheese; salted nuts  Fruits Most fresh, frozen and canned fruits Fruits processed with salt or sodium-containing ingredient (some dried fruits are processed with sodium sulfites        Vegetables Fresh, frozen vegetables and low- sodium canned vegetables Regular canned vegetables, sauerkraut, pickled vegetables, and others prepared in brine; frozen vegetables in sauces; vegetables seasoned with ham, bacon or salt pork  Condiments, Sauces, Miscellaneous  Salt substitute with physician's approval; pepper, herbs, spices; vinegar, lemon or lime juice; hot pepper sauce; garlic powder, onion powder, low sodium soy sauce (1 Tbs.); low sodium condiments (ketchup, chili sauce, mustard) in limited amounts (1 tsp.) fresh ground horseradish; unsalted tortilla chips, pretzels, potato chips, popcorn, salsa (1/4 cup) Any seasoning made with salt including garlic salt, celery salt, onion salt, and seasoned salt; sea salt, rock salt, kosher salt; meat tenderizers; monosodium glutamate; mustard, regular soy sauce, barbecue, sauce, chili sauce, teriyaki sauce, steak sauce, Worcestershire sauce, and most flavored vinegars; canned gravy and mixes; regular condiments; salted snack foods, olives, picles, relish, horseradish sauce, catsup   Food preparation: Try these seasonings Meats:    Pork Sage, onion Serve with applesauce  Chicken Poultry seasoning, thyme, parsley Serve with cranberry sauce  Lamb Curry powder, rosemary, garlic, thyme Serve with mint sauce or jelly  Veal Marjoram, basil Serve with current jelly, cranberry sauce  Beef Pepper, bay leaf Serve with dry mustard, unsalted chive butter  Fish [place] leaf, dill Serve with unsalted lemon butter, unsalted parsley butter  Vegetables:    Asparagus Lemon juice   Broccoli Lemon juice   Carrots Mustard dressing parsley, mint, nutmeg, glazed with unsalted butter and sugar   Green beans Marjoram, lemon juice, nutmeg,dill seed   Tomatoes  Basil, marjoram, onion   Spice /blend for [employer]" 4 tsp ground thyme 1 tsp ground sage 3 tsp ground rosemary 4 tsp ground marjoram   Test your knowledge A product that says "Salt Free" may still contain sodium. True or False Garlic Powder and Hot Pepper Sauce an be used as alternative seasonings.True or False Processed foods have more sodium than fresh foods.  True or False Canned Vegetables have less sodium than froze True or False   WAYS TO DECREASE YOUR SODIUM INTAKE Avoid the use of added salt in cooking and at the table.  Table salt (and other prepared seasonings which contain salt) is probably one of the greatest sources of sodium in the diet.  Unsalted foods can gain flavor from the sweet, sour, and butter taste sensations of herbs and spices.  Instead of using salt for seasoning, try the following seasonings with the foods listed.  Remember: how you use them to enhance natural food flavors is limited only by your creativity... Allspice-Meat, fish, eggs, fruit, peas, red and yellow vegetables Almond Extract-Fruit baked goods Anise Seed-Sweet breads, fruit, carrots, beets, cottage cheese, cookies (tastes like licorice) Basil-Meat, fish, eggs, vegetables, rice, vegetables salads, soups, sauces Bay Leaf-Meat, fish, stews, poultry Burnet-Salad, vegetables (cucumber-like flavor) Caraway Seed-Bread, cookies, cottage cheese, meat, vegetables, cheese, rice Cardamon-Baked goods, fruit, soups Celery Powder or seed-Salads, salad dressings, sauces, meatloaf, soup, bread.Do not use  celery salt Chervil-Meats, salads, fish,  eggs, vegetables, cottage cheese (parsley-like flavor) Chili Power-Meatloaf, chicken cheese, corn, eggplant, egg dishes Chives-Salads cottage cheese, egg dishes, soups, vegetables, sauces Cilantro-Salsa, casseroles Cinnamon-Baked goods, fruit, pork, lamb, chicken, carrots Cloves-Fruit, baked goods, fish, pot roast, green beans, beets, carrots Coriander-Pastry, cookies,  meat, salads, cheese (lemon-orange flavor) Cumin-Meatloaf, fish,cheese, eggs, cabbage,fruit pie (caraway flavor) [employer], fruit, eggs, fish, poultry, cottage cheese, vegetables Dill Seed-Meat, cottage cheese, poultry, vegetables, fish, salads, bread Fennel Seed-Bread, cookies, apples, pork, eggs, fish, beets, cabbage, cheese, Licorice-like flavor Garlic-(buds or powder) Salads, meat, poultry, fish, bread, butter, vegetables, potatoes.Do not  use garlic salt Ginger-Fruit, vegetables, baked goods, meat, fish, poultry Horseradish Root-Meet, vegetables, butter Lemon Juice or Extract-Vegetables, fruit, tea, baked goods, fish salads Mace-Baked goods fruit, vegetables, fish, poultry (taste like nutmeg) Maple Extract-Syrups Marjoram-Meat, chicken, fish, vegetables, breads, green salads (taste like Sage) Mint-Tea, lamb, sherbet, vegetables, desserts, carrots, cabbage Mustard, Dry or Seed-Cheese, eggs, meats, vegetables, poultry Nutmeg-Baked goods, fruit, chicken, eggs, vegetables, desserts Onion Powder-Meat, fish, poultry, vegetables, cheese, eggs, bread, rice salads (Do not use   Onion salt) Orange Extract-Desserts, baked goods Oregano-Pasta, eggs, cheese, onions, pork, lamb, fish, chicken, vegetables, green salads Paprika-Meat, fish, poultry, eggs, cheese, vegetables Parsley Flakes-Butter, vegetables, meat fish, poultry, eggs, bread, salads (certain forms may   Contain sodium Pepper-Meat fish, poultry, vegetables, eggs Peppermint Extract-Desserts, baked goods Poppy Seed-Eggs, bread, cheese, fruit dressings, baked goods, noodles, vegetables, cottage  [employer], poultry, meat, fish, cauliflower, turnips,eggs bread Saffron-Rice, bread, veal, chicken, fish, eggs Sage-Meat, fish, poultry, onions, eggplant, tomateos, pork, stews Savory-Eggs, salads, poultry, meat, rice, vegetables, soups, pork Tarragon-Meat, poultry, fish, eggs, butter, vegetables  (licorice-like flavor)  Thyme-Meat, poultry, fish, eggs, vegetables, (clover-like flavor), sauces, soups Tumeric-Salads, butter, eggs, fish, rice, vegetables (saffron-like flavor) Vanilla Extract-Baked goods, candy Vinegar-Salads, vegetables, meat marinades Walnut Extract-baked goods, candy   2. Choose your Foods Wisely   The following is a list of foods to avoid which are high in sodium:  Meats-Avoid all smoked, canned, salt cured, dried and kosher meat and fish as well as Anchovies   [employer] meats:Bologna, Liverwurst, Pastrami Canned meat or fish  Marinated herring Caviar    Pepperoni Corned Beef   Pizza Dried chipped beef  Salami Frozen breaded fish or meat Salt pork Frankfurters or hot dogs  Sardines Gefilte fish   Sausage Ham (boiled ham, Proscuitto Smoked butt    spiced ham)   Spam      TV Dinners Vegetables Canned vegetables (Regular) Relish Canned mushrooms  Sauerkraut Olives    Tomato juice Pickles  Bakery and Dessert Products Canned puddings  Cream pies Cheesecake   Decorated cakes Cookies  Beverages/Juices Tomato juice, regular  Gatorade   V-8 vegetable juice, regular  Breads and Cereals Biscuit mixes   Salted potato chips, corn chips, pretzels Bread stuffing mixes  Salted crackers and rolls Pancake and waffle mixes Self-rising flour  Seasonings Accent    Meat sauces Barbecue sauce  Meat tenderizer Catsup    Monosodium glutamate (MSG) Celery salt   Onion salt Chili sauce   Prepared mustard Garlic salt   Salt, seasoned salt, sea salt Gravy mixes   Soy sauce Horseradish   Steak sauce Ketchup   Tartar sauce Lite salt    Teriyaki sauce Marinade mixes   Worcestershire sauce  Others Baking powder   Cocoa and cocoa mixes Baking soda   Commercial casserole mixes Candy-caramels, chocolate  Dehydrated soups    Bars, fudge,nougats  Instant rice and pasta mixes  Canned broth or soup  Maraschino cherries Cheese, aged and processed cheese and  cheese spreads  Learning Assessment Quiz  Indicated T (for True) or F (for False) for each of the following statements:  _____ Fresh fruits and vegetables and unprocessed grains are generally low in sodium _____ Water may contain a considerable amount of sodium, depending on the source _____ You can always tell if a food is high in sodium by tasting it _____ Certain laxatives my be high in sodium and should be avoided unless prescribed   by a physician or pharmacist _____ Salt substitutes may be used freely by anyone on a sodium restricted diet _____ Sodium is present in table salt, food additives and as a natural component of   most foods _____ Table salt is approximately 90% sodium _____ Limiting sodium intake may help prevent excess fluid accumulation in the body _____ On a sodium-restricted diet, seasonings such as bouillon soy sauce, and    cooking wine should be used in place of table salt _____ On an ingredient list, a product which lists monosodium glutamate as the first   ingredient is an appropriate food to include on a low sodium diet  Circle the best answer(s) to the following statements (Hint: there may be more than one correct answer)  11. On a low-sodium diet, some acceptable snack items are:    A. Olives  F. Bean dip   K. Grapefruit juice    B. Salted Pretzels G. Commercial Popcorn   L. Canned peaches    C. Carrot Sticks  H. Bouillon   M. Unsalted nuts   D. [place] fries  I. Peanut butter crackers N. Salami   E. Sweet pickles J. Tomato Juice   O. Pizza  12.  Seasonings that may be used freely on a reduced - sodium diet include   A. Lemon wedges F.Monosodium glutamate K. Celery seed    B.Soysauce   G. Pepper   L. Mustard powder   C. Sea salt  H. Cooking wine  M. Onion flakes   D. Vinegar  E. Prepared horseradish N. Salsa   E. Sage   J. Worcestershire sauce  O. Chutney

## 2021-04-12 ENCOUNTER — Ambulatory Visit: Payer: 59 | Admitting: Family

## 2021-04-26 ENCOUNTER — Ambulatory Visit (INDEPENDENT_AMBULATORY_CARE_PROVIDER_SITE_OTHER): Payer: 59 | Admitting: Family

## 2021-04-26 ENCOUNTER — Other Ambulatory Visit: Payer: Self-pay

## 2021-04-26 VITALS — BP 132/44 | HR 64 | Resp 16 | Ht 67.0 in | Wt 210.0 lb

## 2021-04-26 DIAGNOSIS — E785 Hyperlipidemia, unspecified: Secondary | ICD-10-CM

## 2021-04-26 DIAGNOSIS — I10 Essential (primary) hypertension: Secondary | ICD-10-CM

## 2021-04-26 DIAGNOSIS — I2510 Atherosclerotic heart disease of native coronary artery without angina pectoris: Secondary | ICD-10-CM | POA: Diagnosis not present

## 2021-04-26 DIAGNOSIS — K047 Periapical abscess without sinus: Secondary | ICD-10-CM

## 2021-04-26 NOTE — Assessment & Plan Note (Signed)
On aspirin 81mg , followed by cardiology.

## 2021-04-26 NOTE — Progress Notes (Signed)
Subjective:     Patient ID: Kaitlyn Bowman, female    DOB: [birth year], 60 y.o.   MRN: [id number]  Chief Complaint  Patient presents with   Follow-up    3 month     HPI   Patient is in today for routine follow-up.  Dental abscess-we last saw her on Feb 20, 2021.  This was via virtual visit.  She was treated with Augmentin 875 mg twice daily and advised to seek dental evaluation. She did have 2 teeth extracted and felt better.   Hypertension-the patient is not currently on any antihypertensive medication.  Hyperlipidemia-unfortunately she is intolerant to statins and also had muscle pain on Praluent.  She ultimately presented to the emergency department on May 19 due to pain and was treated with Toradol tablets.  Lab Results  Component Value Date   CHOL 153 01/11/2021   HDL 38.60 (L) 01/11/2021   LDLCALC 88 01/11/2021   LDLDIRECT 76 12/03/2018   TRIG 132.0 01/11/2021   CHOLHDL 4 01/11/2021   CAD-she is followed by cardiology.  BP Readings from Last 3 Encounters:  04/26/21 (!) 132/44  03/22/21 140/84  01/11/21 (!) 149/70    There are no preventive care reminders to display for this patient.   Past Medical History:  Diagnosis Date   Abnormal TSH    Alcohol abuse    Cocaine abuse ([place])    Remote crack cocaine use. UDS negative for cocaine 01/2012   Coronary artery disease     inferior STEMI 01/13/12 s/p PTCA/DES to mid RCA. Initial EF 45% by cath but improved to 55-60% by echo 01/14/12   Dyslipidemia    Trig 201, HDL 36, LDL 197 01/2012   GERD (gastroesophageal reflux disease)    History of MI (myocardial infarction) 01/2012   Hypothyroidism    Myocardial infarction [facility]) 5681   Umbilical hernia     Past Surgical History:  Procedure Laterality Date   ABDOMINAL HYSTERECTOMY  1981   partial   CARDIAC CATHETERIZATION  2010, 2013   Negative. 2013--100% blockage   HERNIA REPAIR  2017   abd hernia    LEFT HEART CATHETERIZATION WITH CORONARY ANGIOGRAM N/A 01/12/2012    Procedure: LEFT HEART CATHETERIZATION WITH CORONARY ANGIOGRAM;  Surgeon: Burnell Blanks, MD;  Location[facility] CATH LAB;  Service: Cardiovascular;  Laterality: N/A;    Family History  Problem Relation Age of Onset   Heart attack Mother 49       prior pacemaker   Heart disease Mother    Diabetes Mother    Heart failure Father        CHF   Heart disease Father    Alcoholism Father    Stroke Brother     Social History   Socioeconomic History   Marital status: Single    Spouse name: Not on file   Number of children: 1   Years of education: Not on file   Highest education level: GED or equivalent  Occupational History   Occupation: Best boy: [employer]  Tobacco Use   Smoking status: Former    Years: 33.00    Types: Cigarettes    Quit date: 06/13/2008    Years since quitting: 12.8   Smokeless tobacco: Never   Tobacco comments:    None since 2009   Substance and Sexual Activity   Alcohol use: No    Comment: former   Drug use: No    Types: "Crack" cocaine    Comment:  Crack cocaine; none since Jan 2006    Sexual activity: Not on file  Other Topics Concern   Not on file  Social History Narrative   Regular exercise:  Walks 5-7 x weekly   Caffeine use:  1 daily   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   Enjoys reading, sewing, Fill in puzzles, walking   Completed GED.   No pets.               Social Determinants of Health   Financial Resource Strain: Low Risk    Difficulty of Paying Living Expenses: Not very hard  Food Insecurity: No Food Insecurity   Worried About Charity fundraiser in the Last Year: Never true   Ran Out of Food in the Last Year: Never true  Transportation Needs: No Transportation Needs   Lack of Transportation (Medical): No   Lack of Transportation (Non-Medical): No  Physical Activity: Insufficiently Active   Days of Exercise per Week: 4 days   Minutes of  Exercise per Session: 20 min  Stress: No Stress Concern Present   Feeling of Stress : Not at all  Social Connections: Moderately Isolated   Frequency of Communication with Friends and Family: More than three times a week   Frequency of Social Gatherings with Friends and Family: Twice a week   Attends Religious Services: More than 4 times per year   Active Member of [employer] or Organizations: No   Attends Music therapist: Not on file   Marital Status: Never married  Intimate Partner Violence: Not on file    Outpatient Medications Prior to Visit  Medication Sig Dispense Refill   amoxicillin-clavulanate (AUGMENTIN) 875-125 MG tablet Take 1 tablet by mouth 2 (two) times daily. 20 tablet 0   aspirin EC 81 MG tablet Take 1 tablet (81 mg total) by mouth daily. 90 tablet 3   betamethasone dipropionate 0.05 % cream Apply topically 2 (two) times daily as needed. 30 g 1   fluticasone (FLONASE) 50 MCG/ACT nasal spray Place 2 sprays into both nostrils daily. 16 g 1   nitroGLYCERIN (NITROSTAT) 0.4 MG SL tablet Place 1 tablet (0.4 mg total) under the tongue every 5 (five) minutes x 3 doses as needed for chest pain. 25 tablet 6   sodium chloride (OCEAN) 0.65 % SOLN nasal spray Place 1 spray into both nostrils as needed for congestion. 15 mL 0   No facility-administered medications prior to visit.    Allergies  Allergen Reactions   Ciprofloxacin     REACTION: nause-sweating-chest tightness   Atorvastatin     Myalgia    Crestor [Rosuvastatin Calcium] Nausea Only   Nexlizet [Bempedoic Acid-Ezetimibe] Other (See Comments)    myalgia   Livalo [Pitavastatin]     myalgia   Praluent [Alirocumab]     Muscle aches    ROS    See HPI Objective:    Physical Exam Constitutional:      General: She is not in acute distress.    Appearance: Normal appearance. She is well-developed.  HENT:     Head: Normocephalic and atraumatic.     Right Ear: External ear normal.     Left Ear: External  ear normal.  Eyes:     General: No scleral icterus. Neck:     Thyroid: No thyromegaly.  Cardiovascular:     Rate and Rhythm: Normal rate and  regular rhythm.     Heart sounds: Normal heart sounds. No murmur heard. Pulmonary:     Effort: Pulmonary effort is normal. No respiratory distress.     Breath sounds: Normal breath sounds. No wheezing.  Musculoskeletal:     Cervical back: Neck supple.  Skin:    General: Skin is warm and dry.  Neurological:     Mental Status: She is alert and oriented to person, place, and time.  Psychiatric:        Mood and Affect: Mood normal.        Behavior: Behavior normal.        Thought Content: Thought content normal.        Judgment: Judgment normal.    BP (!) 132/44   Pulse 64   Resp 16   Ht 5\' 7"  (1.702 m)   Wt 210 lb (95.3 kg)   SpO2 93%   BMI 32.89 kg/m  Wt Readings from Last 3 Encounters:  04/26/21 210 lb (95.3 kg)  03/22/21 207 lb 6.4 oz (94.1 kg)  01/11/21 213 lb (96.6 kg)       Assessment & Plan:   Problem List Items Addressed This Visit       Unprioritized   Hyperlipidemia - Primary    Intolerant to statins. Check follow up lipid panel.        Relevant Orders   Lipid panel   Essential hypertension    BP is stable without medication. Monitor.        Dental abscess    Resolved following extractions.        CAD (coronary artery disease)    On aspirin 81mg , followed by cardiology.         I am having Cheray A. Pardoe maintain her aspirin EC, nitroGLYCERIN, sodium chloride, fluticasone, betamethasone dipropionate, and amoxicillin-clavulanate.  No orders of the defined types were placed in this encounter.

## 2021-04-26 NOTE — Assessment & Plan Note (Signed)
BP is stable without medication. Monitor.

## 2021-04-26 NOTE — Assessment & Plan Note (Signed)
Intolerant to statins. Check follow up lipid panel.

## 2021-04-26 NOTE — Assessment & Plan Note (Signed)
Resolved following extractions.

## 2021-04-27 ENCOUNTER — Telehealth: Payer: Self-pay | Admitting: Family

## 2021-04-27 LAB — LIPID PANEL
Cholesterol: 298 mg/dL — ABNORMAL HIGH (ref 0–200)
HDL: 33.8 mg/dL — ABNORMAL LOW (ref >39.00)
Total CHOL/HDL Ratio: 9
Triglycerides: 522 mg/dL — ABNORMAL HIGH (ref 0.0–149.0)

## 2021-04-27 LAB — LDL CHOLESTEROL, DIRECT: Direct LDL: 180 mg/dL

## 2021-04-27 MED ORDER — FENOFIBRATE 145 MG PO TABS: 145.0000 mg | ORAL_TABLET | Freq: Every day | ORAL | 5 refills | Status: DC

## 2021-04-27 NOTE — Telephone Encounter (Signed)
Patient states she will take the medication

## 2021-04-27 NOTE — Telephone Encounter (Signed)
Cholesterol and Triglycerides both very high. I would like her to work hard on low fat diet as well as limiting sweets/white carbs. Add fenofibrate once daily. I don't see that she has tried this medication before.

## 2021-04-27 NOTE — Telephone Encounter (Signed)
Patient notified , she states she will start back walking and doesn't want to take any medications

## 2021-04-28 MED ORDER — FENOFIBRATE 145 MG PO TABS: 145.0000 mg | ORAL_TABLET | Freq: Every day | ORAL | 5 refills | Status: DC

## 2021-07-26 ENCOUNTER — Ambulatory Visit (INDEPENDENT_AMBULATORY_CARE_PROVIDER_SITE_OTHER): Payer: 59 | Admitting: Family

## 2021-07-26 ENCOUNTER — Other Ambulatory Visit: Payer: Self-pay

## 2021-07-26 VITALS — BP 147/63 | HR 69 | Temp 97.8°F | Resp 16 | Wt 212.0 lb

## 2021-07-26 DIAGNOSIS — E785 Hyperlipidemia, unspecified: Secondary | ICD-10-CM

## 2021-07-26 DIAGNOSIS — R222 Localized swelling, mass and lump, trunk: Secondary | ICD-10-CM

## 2021-07-26 DIAGNOSIS — I10 Essential (primary) hypertension: Secondary | ICD-10-CM

## 2021-07-26 DIAGNOSIS — J302 Other seasonal allergic rhinitis: Secondary | ICD-10-CM | POA: Diagnosis not present

## 2021-07-26 DIAGNOSIS — E039 Hypothyroidism, unspecified: Secondary | ICD-10-CM | POA: Diagnosis not present

## 2021-07-26 LAB — LIPID PANEL
Cholesterol: 225 mg/dL — ABNORMAL HIGH (ref 0–200)
HDL: 44.3 mg/dL (ref >39.00)
NonHDL: 180.26
Total CHOL/HDL Ratio: 5
Triglycerides: 246 mg/dL — ABNORMAL HIGH (ref 0.0–149.0)
VLDL: 49.2 mg/dL — ABNORMAL HIGH (ref 0.0–40.0)

## 2021-07-26 LAB — COMPREHENSIVE METABOLIC PANEL
ALT: 19 U/L (ref 0–35)
AST: 18 U/L (ref 0–37)
Albumin: 4.1 g/dL (ref 3.5–5.2)
Alkaline Phosphatase: 69 U/L (ref 39–117)
BUN: 10 mg/dL (ref 6–23)
CO2: 28 mEq/L (ref 19–32)
Calcium: 9.8 mg/dL (ref 8.4–10.5)
Chloride: 104 mEq/L (ref 96–112)
Creatinine, Ser: 0.78 mg/dL (ref 0.40–1.20)
GFR: 82.25 mL/min (ref >60.00)
Glucose, Bld: 103 mg/dL — ABNORMAL HIGH (ref 70–99)
Potassium: 4.4 mEq/L (ref 3.5–5.1)
Sodium: 140 mEq/L (ref 135–145)
Total Bilirubin: 0.3 mg/dL (ref 0.2–1.2)
Total Protein: 7.2 g/dL (ref 6.0–8.3)

## 2021-07-26 LAB — TSH: TSH: 4.08 u[IU]/mL (ref 0.35–5.50)

## 2021-07-26 LAB — LDL CHOLESTEROL, DIRECT: Direct LDL: 130 mg/dL

## 2021-07-26 MED ORDER — FLUTICASONE PROPIONATE 50 MCG/ACT NA SUSP: 2.0000 | Freq: Every day | NASAL | 5 refills | Status: DC

## 2021-07-26 NOTE — Assessment & Plan Note (Signed)
Likely lipoma. Will send for soft tissue US for further evaluation.

## 2021-07-26 NOTE — Assessment & Plan Note (Signed)
BP is fair. Will continue to monitor off meds.  BP Readings from Last 3 Encounters:  07/26/21 (!) 147/63  04/26/21 (!) 132/44  03/22/21 140/84

## 2021-07-26 NOTE — Progress Notes (Signed)
Subjective:   By signing my name below, I, Lyric Barr-McArthur, attest that this documentation has been prepared under the direction and in the presence of [name], NP, 07/26/2021    Patient ID: Kaitlyn Bowman, female    DOB: 1961/07/18, 60 y.o.   MRN: [id number]  Chief Complaint  Patient presents with   Hyperlipidemia    Here for follow up   Hypertension    Here for follow up    HPI Patient is in today for an office visit. Neck concern: She is concerned about what she believes is a bump where the base of her neck and upper spine meet.  Shoulder: She has a bump on her left shoulder and is wanting to get an ultrasound to see what it is.  Pain in right side: She is experiencing pain on the right side of her abdomen. It is located near a lipoma she has had for a while that has already been checked on through ultrasound.  Blood pressure: Her blood pressure is elevated today during this visit but she does note she ate a fairly unhealthy lunch which she thinks is a contributor.  BP Readings from Last 3 Encounters:  07/26/21 (!) 147/63  04/26/21 (!) 132/44  03/22/21 140/84  Allergies: She notes that her allergies have been bothering her but she has not been taking any OTC medications to help with her symptoms. She is interested in getting a prescription Flonase prescribed.  Triglyceride: She has been compliant in taking her 145 mg fenofibrate to help lower her triglyceride levels. She notes it has been extremely helpful and effective but she does note some associated leg pain.  Immunizations: She has had 2 Covid-19 vaccinations and is interested in receiving the updated Covid-19 booster shot at another time. She is not interested in receiving her flu shot at this time.   There are no preventive care reminders to display for this patient.   Past Medical History:  Diagnosis Date   Abnormal TSH    Alcohol abuse    Cocaine abuse ([place])    Remote crack cocaine use. UDS negative for  cocaine 01/2012   Coronary artery disease     inferior STEMI 01/13/12 s/p PTCA/DES to mid RCA. Initial EF 45% by cath but improved to 55-60% by echo 01/14/12   Dyslipidemia    Trig 201, HDL 36, LDL 197 01/2012   GERD (gastroesophageal reflux disease)    History of MI (myocardial infarction) 01/2012   Hypothyroidism    Myocardial infarction [facility]) 0156   Umbilical hernia     Past Surgical History:  Procedure Laterality Date   ABDOMINAL HYSTERECTOMY  1981   partial   CARDIAC CATHETERIZATION  2010, 2013   Negative. 2013--100% blockage   HERNIA REPAIR  2017   abd hernia    LEFT HEART CATHETERIZATION WITH CORONARY ANGIOGRAM N/A 01/12/2012   Procedure: LEFT HEART CATHETERIZATION WITH CORONARY ANGIOGRAM;  Surgeon: Burnell Blanks, MD;  Location: [facility] CATH LAB;  Service: Cardiovascular;  Laterality: N/A;    Family History  Problem Relation Age of Onset   Heart attack Mother 67       prior pacemaker   Heart disease Mother    Diabetes Mother    Heart failure Father        CHF   Heart disease Father    Alcoholism Father    Stroke Brother     Social History   Socioeconomic History   Marital status: Single    Spouse  name: Not on file   Number of children: 1   Years of education: Not on file   Highest education level: GED or equivalent  Occupational History   Occupation: Best boy: [employer]  Tobacco Use   Smoking status: Former    Years: 33.00    Types: Cigarettes    Quit date: 06/13/2008    Years since quitting: 13.1   Smokeless tobacco: Never   Tobacco comments:    None since 2009   Substance and Sexual Activity   Alcohol use: No    Comment: former   Drug use: No    Types: "Crack" cocaine    Comment: Crack cocaine; none since Jan 2006    Sexual activity: Not on file  Other Topics Concern   Not on file  Social History Narrative   Regular exercise:  Walks 5-7 x weekly   Caffeine use:  1 daily   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   Enjoys reading, sewing, Fill in puzzles, walking   Completed GED.   No pets.               Social Determinants of Health   Financial Resource Strain: Low Risk    Difficulty of Paying Living Expenses: Not very hard  Food Insecurity: No Food Insecurity   Worried About Charity fundraiser in the Last Year: Never true   Ran Out of Food in the Last Year: Never true  Transportation Needs: No Transportation Needs   Lack of Transportation (Medical): No   Lack of Transportation (Non-Medical): No  Physical Activity: Insufficiently Active   Days of Exercise per Week: 4 days   Minutes of Exercise per Session: 20 min  Stress: No Stress Concern Present   Feeling of Stress : Not at all  Social Connections: Moderately Isolated   Frequency of Communication with Friends and Family: More than three times a week   Frequency of Social Gatherings with Friends and Family: Twice a week   Attends Religious Services: More than 4 times per year   Active Member of [employer] or Organizations: No   Attends Music therapist: Not on file   Marital Status: Never married  Intimate Partner Violence: Not on file    Outpatient Medications Prior to Visit  Medication Sig Dispense Refill   aspirin EC 81 MG tablet Take 1 tablet (81 mg total) by mouth daily. 90 tablet 3   betamethasone dipropionate 0.05 % cream Apply topically 2 (two) times daily as needed. 30 g 1   fenofibrate (TRICOR) 145 MG tablet Take 1 tablet (145 mg total) by mouth daily. 30 tablet 5   nitroGLYCERIN (NITROSTAT) 0.4 MG SL tablet Place 1 tablet (0.4 mg total) under the tongue every 5 (five) minutes x 3 doses as needed for chest pain. 25 tablet 6   sodium chloride (OCEAN) 0.65 % SOLN nasal spray Place 1 spray into both nostrils as needed for congestion. 15 mL 0   amoxicillin-clavulanate (AUGMENTIN) 875-125 MG tablet Take 1 tablet by mouth 2 (two) times daily. 20 tablet 0   fluticasone  (FLONASE) 50 MCG/ACT nasal spray Place 2 sprays into both nostrils daily. 16 g 1   No facility-administered medications prior to visit.    Allergies  Allergen Reactions   Ciprofloxacin     REACTION: nause-sweating-chest tightness   Atorvastatin  Myalgia    Crestor [Rosuvastatin Calcium] Nausea Only   Nexlizet [Bempedoic Acid-Ezetimibe] Other (See Comments)    myalgia   Livalo [Pitavastatin]     myalgia   Praluent [Alirocumab]     Muscle aches    Review of Systems  Genitourinary:  Positive for frequency.  Musculoskeletal:        (+) bump on left shoulder  (+) mass on base of neck and top of spine (+) mild leg pain with use of fenofibrate      Objective:    Physical Exam Constitutional:      General: She is not in acute distress.    Appearance: Normal appearance. She is not ill-appearing.  HENT:     Head: Normocephalic and atraumatic.     Right Ear: External ear normal.     Left Ear: External ear normal.  Eyes:     Extraocular Movements: Extraocular movements intact.     Pupils: Pupils are equal, round, and reactive to light.  Cardiovascular:     Rate and Rhythm: Normal rate and regular rhythm.     Heart sounds: Normal heart sounds. No murmur heard.   No gallop.  Pulmonary:     Effort: Pulmonary effort is normal. No respiratory distress.     Breath sounds: Normal breath sounds. No wheezing or rales.  Musculoskeletal:     Comments: (+) firm mass noted on left scapula   Palpable vertebra prominens, no overlying spinal mass noted  Skin:    General: Skin is warm and dry.  Neurological:     Mental Status: She is alert and oriented to person, place, and time.  Psychiatric:        Behavior: Behavior normal.        Judgment: Judgment normal.    BP (!) 147/63 (BP Location: Left Arm, Patient Position: Sitting, Cuff Size: Large)   Pulse 69   Temp 97.8 F (36.6 C) (Oral)   Resp 16   Wt 212 lb (96.2 kg)   SpO2 100%   BMI 33.20 kg/m  Wt Readings from Last 3  Encounters:  07/26/21 212 lb (96.2 kg)  04/26/21 210 lb (95.3 kg)  03/22/21 207 lb 6.4 oz (94.1 kg)       Assessment & Plan:   Problem List Items Addressed This Visit       Unprioritized   Seasonal allergies    Reports mild allergy symptoms.        Mass of subcutaneous tissue of back    Likely lipoma. Will send for soft tissue US for further evaluation.       Relevant Orders   [place] MiscellaneoUS Localization   Hypothyroidism   Relevant Orders   TSH   Hyperlipidemia - Primary    She is tolerating fenofibrate and working on diet. Obtain follow up lipid panel today. She is non-fasting.      Relevant Orders   Lipid panel   Comp Met (CMET)   Essential hypertension    BP is fair. Will continue to monitor off meds.  BP Readings from Last 3 Encounters:  07/26/21 (!) 147/63  04/26/21 (!) 132/44  03/22/21 140/84         Meds ordered this encounter  Medications   fluticasone (FLONASE) 50 MCG/ACT nasal spray    Sig: Place 2 sprays into both nostrils daily.    Dispense:  16 g    Refill:  5    Order Specific Question:   Supervising Provider    Answer:  BLYTH, STACEY A [[id number]]    I, [name], NP, personally preformed the services described in this documentation.  All medical record entries made by the scribe were at my direction and in my presence.  I have reviewed the chart and discharge instructions (if applicable) and agree that the record reflects my personal performance and is accurate and complete. 07/26/2021  I,Lyric Barr-McArthur,acting as a Education administrator for Nance Pear, NP.,have documented all relevant documentation on the behalf of Nance Pear, NP,as directed by  Nance Pear, NP while in the presence of Nance Pear, NP.  Nance Pear, NP

## 2021-07-26 NOTE — Assessment & Plan Note (Signed)
She is tolerating fenofibrate and working on diet. Obtain follow up lipid panel today. She is non-fasting.

## 2021-07-26 NOTE — Patient Instructions (Signed)
Please complete lab work prior to leaving.   

## 2021-07-26 NOTE — Assessment & Plan Note (Signed)
Reports mild allergy symptoms.

## 2021-08-02 ENCOUNTER — Other Ambulatory Visit: Payer: Self-pay | Admitting: Family

## 2021-08-02 ENCOUNTER — Ambulatory Visit (HOSPITAL_BASED_OUTPATIENT_CLINIC_OR_DEPARTMENT_OTHER)
Admission: RE | Admit: 2021-08-02 | Discharge: 2021-08-02 | Disposition: A | Discharge status: Home / Self Care | Payer: 59 | Source: Ambulatory Visit | Attending: Family | Admitting: Family

## 2021-08-02 ENCOUNTER — Other Ambulatory Visit: Payer: Self-pay

## 2021-08-02 DIAGNOSIS — R222 Localized swelling, mass and lump, trunk: Secondary | ICD-10-CM | POA: Insufficient documentation

## 2021-08-16 ENCOUNTER — Telehealth: Payer: 59 | Admitting: Family

## 2021-08-16 ENCOUNTER — Ambulatory Visit (INDEPENDENT_AMBULATORY_CARE_PROVIDER_SITE_OTHER): Payer: 59 | Admitting: Family

## 2021-08-16 ENCOUNTER — Other Ambulatory Visit: Payer: Self-pay

## 2021-08-16 VITALS — BP 172/76 | HR 63 | Temp 98.2°F | Resp 16 | Ht 67.0 in | Wt 211.0 lb

## 2021-08-16 DIAGNOSIS — R109 Unspecified abdominal pain: Secondary | ICD-10-CM | POA: Insufficient documentation

## 2021-08-16 DIAGNOSIS — R350 Frequency of micturition: Secondary | ICD-10-CM

## 2021-08-16 DIAGNOSIS — I10 Essential (primary) hypertension: Secondary | ICD-10-CM | POA: Diagnosis not present

## 2021-08-16 LAB — POC URINALSYSI DIPSTICK (AUTOMATED)
Bilirubin, UA: NEGATIVE
Blood, UA: NEGATIVE
Glucose, UA: NEGATIVE
Ketones, UA: NEGATIVE
Leukocytes, UA: NEGATIVE
Nitrite, UA: NEGATIVE
Protein, UA: NEGATIVE
Spec Grav, UA: 1.01 (ref 1.010–1.025)
Urobilinogen, UA: NEGATIVE E.U./dL — AB
pH, UA: 6.5 (ref 5.0–8.0)

## 2021-08-16 NOTE — Progress Notes (Signed)
Subjective:   By signing my name below, I, Lyric Barr-McArthur, attest that this documentation has been prepared under the direction and in the presence of [name], NP, 08/16/2021   Patient ID: Kaitlyn Bowman, female    DOB: 1961-01-11, 60 y.o.   MRN: [id number]  Chief Complaint  Patient presents with   Urinary Frequency    Complains of urinary frequency   Flank Pain    Complains of left flank pain    HPI Patient is in today for an office visit.  Blood pressure: Her blood pressure is elevated today in the office. She is having a hard time believing it was this high and would like to have it rechecked manually. It was rechecked manually and resulted in her BP being recorded at 165/84.  BP Readings from Last 3 Encounters:  08/16/21 (!) 172/76  07/26/21 (!) 147/63  04/26/21 (!) 132/44  Urinary symptoms: Last week she was experiencing left flank pain that was so bad it was tender to lay on her left side. She went to get a massage yesterday and the massage therapist noted that her area of pain was warm to the touch. She notes today is the first time urinating has really bugged her in terms of pain. She gave a urine sample today and a urine dip was performed in the office which came back negative.   Health Maintenance Due  Topic Date Due   Pneumococcal Vaccine 3-1 Years old (1 - PCV) Never done    Past Medical History:  Diagnosis Date   Abnormal TSH    Alcohol abuse    Cocaine abuse ([place])    Remote crack cocaine use. UDS negative for cocaine 01/2012   Coronary artery disease     inferior STEMI 01/13/12 s/p PTCA/DES to mid RCA. Initial EF 45% by cath but improved to 55-60% by echo 01/14/12   Dyslipidemia    Trig 201, HDL 36, LDL 197 01/2012   GERD (gastroesophageal reflux disease)    History of MI (myocardial infarction) 01/2012   Hypothyroidism    Myocardial infarction [facility]) 8119   Umbilical hernia     Past Surgical History:  Procedure Laterality Date   ABDOMINAL  HYSTERECTOMY  1981   partial   CARDIAC CATHETERIZATION  2010, 2013   Negative. 2013--100% blockage   HERNIA REPAIR  2017   abd hernia    LEFT HEART CATHETERIZATION WITH CORONARY ANGIOGRAM N/A 01/12/2012   Procedure: LEFT HEART CATHETERIZATION WITH CORONARY ANGIOGRAM;  Surgeon: Burnell Blanks, MD;  Location: [facility] CATH LAB;  Service: Cardiovascular;  Laterality: N/A;    Family History  Problem Relation Age of Onset   Heart attack Mother 107       prior pacemaker   Heart disease Mother    Diabetes Mother    Heart failure Father        CHF   Heart disease Father    Alcoholism Father    Stroke Brother     Social History   Socioeconomic History   Marital status: Single    Spouse name: Not on file   Number of children: 1   Years of education: Not on file   Highest education level: GED or equivalent  Occupational History   Occupation: Best boy: [employer]  Tobacco Use   Smoking status: Former    Years: 33.00    Types: Cigarettes    Quit date: 06/13/2008    Years since quitting: 13.1   Smokeless  tobacco: Never   Tobacco comments:    None since 2009   Substance and Sexual Activity   Alcohol use: No    Comment: former   Drug use: No    Types: "Crack" cocaine    Comment: Crack cocaine; none since Jan 2006    Sexual activity: Not on file  Other Topics Concern   Not on file  Social History Narrative   Regular exercise:  Walks 5-7 x weekly   Caffeine use:  1 daily   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   Enjoys reading, sewing, Fill in puzzles, walking   Completed GED.   No pets.               Social Determinants of Health   Financial Resource Strain: Low Risk    Difficulty of Paying Living Expenses: Not very hard  Food Insecurity: No Food Insecurity   Worried About Charity fundraiser in the Last Year: Never true   Ran Out of Food in the Last Year: Never true   Transportation Needs: No Transportation Needs   Lack of Transportation (Medical): No   Lack of Transportation (Non-Medical): No  Physical Activity: Insufficiently Active   Days of Exercise per Week: 4 days   Minutes of Exercise per Session: 20 min  Stress: No Stress Concern Present   Feeling of Stress : Not at all  Social Connections: Moderately Isolated   Frequency of Communication with Friends and Family: More than three times a week   Frequency of Social Gatherings with Friends and Family: Twice a week   Attends Religious Services: More than 4 times per year   Active Member of [employer] or Organizations: No   Attends Music therapist: Not on file   Marital Status: Never married  Intimate Partner Violence: Not on file    Outpatient Medications Prior to Visit  Medication Sig Dispense Refill   aspirin EC 81 MG tablet Take 1 tablet (81 mg total) by mouth daily. 90 tablet 3   betamethasone dipropionate 0.05 % cream Apply topically 2 (two) times daily as needed. 30 g 1   fenofibrate (TRICOR) 145 MG tablet Take 1 tablet (145 mg total) by mouth daily. 30 tablet 5   fluticasone (FLONASE) 50 MCG/ACT nasal spray Place 2 sprays into both nostrils daily. 16 g 5   nitroGLYCERIN (NITROSTAT) 0.4 MG SL tablet Place 1 tablet (0.4 mg total) under the tongue every 5 (five) minutes x 3 doses as needed for chest pain. 25 tablet 6   sodium chloride (OCEAN) 0.65 % SOLN nasal spray Place 1 spray into both nostrils as needed for congestion. 15 mL 0   No facility-administered medications prior to visit.    Allergies  Allergen Reactions   Ciprofloxacin     REACTION: nause-sweating-chest tightness   Atorvastatin     Myalgia    Crestor [Rosuvastatin Calcium] Nausea Only   Nexlizet [Bempedoic Acid-Ezetimibe] Other (See Comments)    myalgia   Livalo [Pitavastatin]     myalgia   Praluent [Alirocumab]     Muscle aches    Review of Systems  Genitourinary:  Positive for dysuria (onset  today), flank pain (on left side) and frequency (notes she has been urinating more often).      Objective:    Physical Exam Constitutional:      General: She is not in acute distress.  Appearance: Normal appearance. She is not ill-appearing.  HENT:     Head: Normocephalic and atraumatic.     Right Ear: External ear normal.     Left Ear: External ear normal.  Eyes:     Extraocular Movements: Extraocular movements intact.     Pupils: Pupils are equal, round, and reactive to light.  Cardiovascular:     Rate and Rhythm: Normal rate and regular rhythm.     Heart sounds: Normal heart sounds. No murmur heard.   No gallop.  Pulmonary:     Effort: Pulmonary effort is normal. No respiratory distress.     Breath sounds: Normal breath sounds. No wheezing or rales.  Abdominal:     General: Bowel sounds are normal.     Palpations: Abdomen is soft.     Tenderness: There is no abdominal tenderness. There is no right CVA tenderness or left CVA tenderness.  Lymphadenopathy:     Cervical: No cervical adenopathy.  Skin:    General: Skin is warm and dry.  Neurological:     Mental Status: She is alert and oriented to person, place, and time.  Psychiatric:        Behavior: Behavior normal.        Judgment: Judgment normal.    BP (!) 172/76 (BP Location: Left Arm, Patient Position: Sitting, Cuff Size: Large)   Pulse 63   Temp 98.2 F (36.8 C) (Oral)   Resp 16   Ht 5\' 7"  (1.702 m)   Wt 211 lb (95.7 kg)   SpO2 99%   BMI 33.05 kg/m  Wt Readings from Last 3 Encounters:  08/16/21 211 lb (95.7 kg)  07/26/21 212 lb (96.2 kg)  04/26/21 210 lb (95.3 kg)       Assessment & Plan:   Problem List Items Addressed This Visit       Unprioritized   Flank pain    Resolved. UA unremarkable.  I suspect she may have had a kidney stone, but passed it.  Exam WNL. Will send urine for culture.  Advised pt to let me know if she has recurrent symptoms.  Pt verbalizes understanding.       Essential  hypertension    BP Readings from Last 3 Encounters:  08/16/21 (!) 172/76  07/26/21 (!) 147/63  04/26/21 (!) 132/44  BP is up today. She would like to recheck when she gets home and will send me her reading via mychart.       Other Visit Diagnoses     Urinary frequency    -  Primary   Relevant Orders   POCT Urinalysis Dipstick (Automated) (Completed)   Urine Culture      No orders of the defined types were placed in this encounter.   I, [name], NP, personally preformed the services described in this documentation.  All medical record entries made by the scribe were at my direction and in my presence.  I have reviewed the chart and discharge instructions (if applicable) and agree that the record reflects my personal performance and is accurate and complete. 08/16/2021  I,Lyric Barr-McArthur,acting as a Education administrator for Nance Pear, NP.,have documented all relevant documentation on the behalf of Nance Pear, NP,as directed by  Nance Pear, NP while in the presence of Nance Pear, NP.  Nance Pear, NP

## 2021-08-16 NOTE — Assessment & Plan Note (Signed)
BP Readings from Last 3 Encounters:  08/16/21 (!) 172/76  07/26/21 (!) 147/63  04/26/21 (!) 132/44   BP is up today. She would like to recheck when she gets home and will send me her reading via mychart.

## 2021-08-16 NOTE — Assessment & Plan Note (Signed)
Resolved. UA unremarkable.  I suspect she may have had a kidney stone, but passed it.  Exam WNL. Will send urine for culture.  Advised pt to let me know if she has recurrent symptoms.  Pt verbalizes understanding.

## 2021-08-18 LAB — URINE CULTURE
MICRO NUMBER:: 12608875
SPECIMEN QUALITY:: ADEQUATE

## 2021-08-19 ENCOUNTER — Telehealth: Payer: Self-pay | Admitting: Family

## 2021-08-19 MED ORDER — CEPHALEXIN 500 MG PO CAPS: 500.0000 mg | ORAL_CAPSULE | Freq: Three times a day (TID) | ORAL | 0 refills | Status: AC

## 2021-08-19 NOTE — Telephone Encounter (Signed)
Patient advised of results and new prescription 

## 2021-08-19 NOTE — Telephone Encounter (Signed)
Urine culture is showing bacteria. I would like her to begin keflex for UTI.

## 2021-09-19 ENCOUNTER — Other Ambulatory Visit: Payer: Self-pay | Admitting: *Deleted

## 2021-11-01 ENCOUNTER — Ambulatory Visit: Payer: BLUE CROSS/BLUE SHIELD | Admitting: Family

## 2021-11-01 ENCOUNTER — Telehealth (INDEPENDENT_AMBULATORY_CARE_PROVIDER_SITE_OTHER): Payer: BLUE CROSS/BLUE SHIELD | Admitting: Family

## 2021-11-01 ENCOUNTER — Telehealth: Payer: Self-pay | Admitting: Family

## 2021-11-01 DIAGNOSIS — F4321 Adjustment disorder with depressed mood: Secondary | ICD-10-CM | POA: Diagnosis not present

## 2021-11-01 MED ORDER — HYDROXYZINE PAMOATE 25 MG PO CAPS: 25.0000 mg | ORAL_CAPSULE | Freq: Three times a day (TID) | ORAL | 1 refills | Status: DC | PRN

## 2021-11-01 NOTE — Telephone Encounter (Signed)
Why don't we set her up for a video visit for anxiety?

## 2021-11-01 NOTE — Progress Notes (Signed)
MyChart Video Visit    Virtual Visit via Video Note   This visit type was conducted due to national recommendations for restrictions regarding the COVID-19 Pandemic (e.g. social distancing) in an effort to limit this patient's exposure and mitigate transmission in our community. This patient is at least at moderate risk for complications without adequate follow up. This format is felt to be most appropriate for this patient at this time. Physical exam was limited by quality of the video and audio technology used for the visit. CMA was able to get the patient set up on a video visit.  Patient location: Home. Patient and provider in visit Provider location: Office  I discussed the limitations of evaluation and management by telemedicine and the availability of in person appointments. The patient expressed understanding and agreed to proceed.  Visit Date: 11/01/2021  Today's healthcare provider: Nance Pear, NP     Subjective:    Patient ID: Kaitlyn Bowman, female    DOB: [birth year], 61 y.o.   MRN: [id number]  No chief complaint on file.   HPI  Patient is a 61 yr old female who presents today to discuss anxiety/stress. States that her grandson who lived with her for many years was recently shot in [place].  Initially they thought that he was going to be paralyzed but he underwent an 8 hour surgery and learned that he would not be paralyzed. He has to undergo another surgery to reconstruct his elbow.   This situation has really gotten her stressed and upset. She is not sleeping.   Past Medical History:  Diagnosis Date   Abnormal TSH    Alcohol abuse    Cocaine abuse ([address])    Remote crack cocaine use. UDS negative for cocaine 01/2012   Coronary artery disease     inferior STEMI 01/13/12 s/p PTCA/DES to mid RCA. Initial EF 45% by cath but improved to 55-60% by echo 01/14/12   Dyslipidemia    Trig 201, HDL 36, LDL 197 01/2012   GERD (gastroesophageal reflux disease)     History of MI (myocardial infarction) 01/2012   Hypothyroidism    Myocardial infarction [facility]) 8280   Umbilical hernia     Past Surgical History:  Procedure Laterality Date   ABDOMINAL HYSTERECTOMY  1981   partial   CARDIAC CATHETERIZATION  2010, 2013   Negative. 2013--100% blockage   HERNIA REPAIR  2017   abd hernia    LEFT HEART CATHETERIZATION WITH CORONARY ANGIOGRAM N/A 01/12/2012   Procedure: LEFT HEART CATHETERIZATION WITH CORONARY ANGIOGRAM;  Surgeon: Burnell Blanks, MD;  Location: [facility] CATH LAB;  Service: Cardiovascular;  Laterality: N/A;    Family History  Problem Relation Age of Onset   Heart attack Mother 28       prior pacemaker   Heart disease Mother    Diabetes Mother    Heart failure Father        CHF   Heart disease Father    Alcoholism Father    Stroke Brother     Social History   Socioeconomic History   Marital status: Single    Spouse name: Not on file   Number of children: 1   Years of education: Not on file   Highest education level: GED or equivalent  Occupational History   Occupation: Best boy: [employer]  Tobacco Use   Smoking status: Former    Years: 33.00    Types: Cigarettes    Quit date:  06/13/2008    Years since quitting: 13.3   Smokeless tobacco: Never   Tobacco comments:    None since 2009   Substance and Sexual Activity   Alcohol use: No    Comment: former   Drug use: No    Types: "Crack" cocaine    Comment: Crack cocaine; none since Jan 2006    Sexual activity: Not on file  Other Topics Concern   Not on file  Social History Narrative   Regular exercise:  Walks 5-7 x weekly   Caffeine use:  1 daily   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   Enjoys reading, sewing, Fill in puzzles, walking   Completed GED.   No pets.               Social Determinants of Health   Financial Resource Strain: Low Risk    Difficulty of Paying Living  Expenses: Not very hard  Food Insecurity: No Food Insecurity   Worried About Charity fundraiser in the Last Year: Never true   Ran Out of Food in the Last Year: Never true  Transportation Needs: No Transportation Needs   Lack of Transportation (Medical): No   Lack of Transportation (Non-Medical): No  Physical Activity: Insufficiently Active   Days of Exercise per Week: 4 days   Minutes of Exercise per Session: 20 min  Stress: No Stress Concern Present   Feeling of Stress : Not at all  Social Connections: Moderately Isolated   Frequency of Communication with Friends and Family: More than three times a week   Frequency of Social Gatherings with Friends and Family: Twice a week   Attends Religious Services: More than 4 times per year   Active Member of [employer] or Organizations: No   Attends Music therapist: Not on file   Marital Status: Never married  Intimate Partner Violence: Not on file    Outpatient Medications Prior to Visit  Medication Sig Dispense Refill   aspirin EC 81 MG tablet Take 1 tablet (81 mg total) by mouth daily. 90 tablet 3   betamethasone dipropionate 0.05 % cream Apply topically 2 (two) times daily as needed. 30 g 1   fenofibrate (TRICOR) 145 MG tablet Take 1 tablet (145 mg total) by mouth daily. 30 tablet 5   fluticasone (FLONASE) 50 MCG/ACT nasal spray Place 2 sprays into both nostrils daily. 16 g 5   nitroGLYCERIN (NITROSTAT) 0.4 MG SL tablet Place 1 tablet (0.4 mg total) under the tongue every 5 (five) minutes x 3 doses as needed for chest pain. 25 tablet 6   sodium chloride (OCEAN) 0.65 % SOLN nasal spray Place 1 spray into both nostrils as needed for congestion. 15 mL 0   No facility-administered medications prior to visit.    Allergies  Allergen Reactions   Ciprofloxacin     REACTION: nause-sweating-chest tightness   Atorvastatin     Myalgia    Crestor [Rosuvastatin Calcium] Nausea Only   Nexlizet [Bempedoic Acid-Ezetimibe] Other (See  Comments)    myalgia   Livalo [Pitavastatin]     myalgia   Praluent [Alirocumab]     Muscle aches    ROS See HPI    Objective:    Physical Exam Constitutional:      Comments: Looks very tired  Pulmonary:     Effort: Pulmonary effort is normal.  Neurological:  Mental Status: She is alert.  Psychiatric:        Attention and Perception: Attention normal.        Mood and Affect: Affect is tearful.        Speech: Speech normal.        Behavior: Behavior normal.        Thought Content: Thought content normal.    There were no vitals taken for this visit. Wt Readings from Last 3 Encounters:  08/16/21 211 lb (95.7 kg)  07/26/21 212 lb (96.2 kg)  04/26/21 210 lb (95.3 kg)       Assessment & Plan:   Problem List Items Addressed This Visit       Unprioritized   Grief reaction - Primary    New.  Would like to avoid controlled substances given her previous hx of drug abuse. Recommended trial of prn atarax for sleep and as needed for anxiety.  Support provided.        I am having Nanette A. Protzman start on hydrOXYzine. I am also having her maintain her aspirin EC, nitroGLYCERIN, sodium chloride, betamethasone dipropionate, fenofibrate, and fluticasone.  Meds ordered this encounter  Medications   hydrOXYzine (VISTARIL) 25 MG capsule    Sig: Take 1 capsule (25 mg total) by mouth every 8 (eight) hours as needed for anxiety (or sleep).    Dispense:  30 capsule    Refill:  1    Order Specific Question:   Supervising Provider    Answer:   Penni Homans A [[id number]]    I discussed the assessment and treatment plan with the patient. The patient was provided an opportunity to ask questions and all were answered. The patient agreed with the plan and demonstrated an understanding of the instructions.   The patient was advised to call back or seek an in-person evaluation if the symptoms worsen or if the condition fails to improve as anticipated.  Nance Pear, NP [employer] [facility] [phone] (phone) [phone] (fax)  [place]

## 2021-11-01 NOTE — Telephone Encounter (Signed)
Pt called to cancel her appointment today. She was rescheduled to February because of a family emergency. She wanted [name] to know she is a very stressed out, her only grandson was involved in a shooting, and had to undergo an 8 hour surgery. He is stable currently and she is going to see him. She would like [name] to touch base with her as soon as possible. Please advise.

## 2021-11-01 NOTE — Telephone Encounter (Signed)
Patient will have virtual visit later today

## 2021-11-02 NOTE — Assessment & Plan Note (Signed)
New.  Would like to avoid controlled substances given her previous hx of drug abuse. Recommended trial of prn atarax for sleep and as needed for anxiety.  Support provided.

## 2021-11-29 ENCOUNTER — Ambulatory Visit (INDEPENDENT_AMBULATORY_CARE_PROVIDER_SITE_OTHER): Payer: BLUE CROSS/BLUE SHIELD | Admitting: Family

## 2021-11-29 VITALS — BP 155/85 | HR 65 | Temp 98.6°F | Resp 16 | Wt 214.0 lb

## 2021-11-29 DIAGNOSIS — F4321 Adjustment disorder with depressed mood: Secondary | ICD-10-CM

## 2021-11-29 DIAGNOSIS — I10 Essential (primary) hypertension: Secondary | ICD-10-CM

## 2021-11-29 DIAGNOSIS — N6331 Unspecified lump in axillary tail of the right breast: Secondary | ICD-10-CM | POA: Insufficient documentation

## 2021-11-29 MED ORDER — CARVEDILOL 3.125 MG PO TABS: 3.1250 mg | ORAL_TABLET | Freq: Two times a day (BID) | ORAL | 3 refills | Status: DC

## 2021-11-29 NOTE — Assessment & Plan Note (Signed)
New. Orders as below.

## 2021-11-29 NOTE — Patient Instructions (Signed)
Please add carvedilol 3.125mg  twice daily for blood pressure.

## 2021-11-29 NOTE — Assessment & Plan Note (Signed)
Stable. Support provided. She decided not to use the atarax.  Monitor.

## 2021-11-29 NOTE — Progress Notes (Signed)
Subjective:     Patient ID: Kaitlyn Bowman, female    DOB: April 01, 1961, 61 y.o.   MRN: [id number]  Chief Complaint  Patient presents with   Hypertension    Here for follow up    Hypertension  Patient is in today for follow up.   Grief reaction- her grandson is up and walking and healing well. She is hanging in there but notes a lot of stress going back and forth to [place] and juggling her job.  C/o mass right lateral breast, "I think it's a lipoma."    "I know my blood pressure is up." Not currently on antihypertensive.  BP Readings from Last 3 Encounters:  11/29/21 (!) 155/85  08/16/21 (!) 172/76  07/26/21 (!) 147/63      There are no preventive care reminders to display for this patient.  Past Medical History:  Diagnosis Date   Abnormal TSH    Alcohol abuse    Cocaine abuse ([place])    Remote crack cocaine use. UDS negative for cocaine 01/2012   Coronary artery disease     inferior STEMI 01/13/12 s/p PTCA/DES to mid RCA. Initial EF 45% by cath but improved to 55-60% by echo 01/14/12   Dyslipidemia    Trig 201, HDL 36, LDL 197 01/2012   GERD (gastroesophageal reflux disease)    History of MI (myocardial infarction) 01/2012   Hypothyroidism    Myocardial infarction [facility]) 3244   Umbilical hernia     Past Surgical History:  Procedure Laterality Date   ABDOMINAL HYSTERECTOMY  1981   partial   CARDIAC CATHETERIZATION  2010, 2013   Negative. 2013--100% blockage   HERNIA REPAIR  2017   abd hernia    LEFT HEART CATHETERIZATION WITH CORONARY ANGIOGRAM N/A 01/12/2012   Procedure: LEFT HEART CATHETERIZATION WITH CORONARY ANGIOGRAM;  Surgeon: Burnell Blanks, MD;  Location: [facility] CATH LAB;  Service: Cardiovascular;  Laterality: N/A;    Family History  Problem Relation Age of Onset   Heart attack Mother 16       prior pacemaker   Heart disease Mother    Diabetes Mother    Heart failure Father        CHF   Heart disease Father    Alcoholism Father    Stroke Brother      Social History   Socioeconomic History   Marital status: Single    Spouse name: Not on file   Number of children: 1   Years of education: Not on file   Highest education level: GED or equivalent  Occupational History   Occupation: Best boy: [employer]  Tobacco Use   Smoking status: Former    Years: 33.00    Types: Cigarettes    Quit date: 06/13/2008    Years since quitting: 13.4   Smokeless tobacco: Never   Tobacco comments:    None since 2009   Substance and Sexual Activity   Alcohol use: No    Comment: former   Drug use: No    Types: "Crack" cocaine    Comment: Crack cocaine; none since Jan 2006    Sexual activity: Not on file  Other Topics Concern   Not on file  Social History Narrative   Regular exercise:  Walks 5-7 x weekly   Caffeine use:  1 daily   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  Enjoys reading, sewing, Fill in puzzles, walking   Completed GED.   No pets.               Social Determinants of Health   Financial Resource Strain: Low Risk    Difficulty of Paying Living Expenses: Not very hard  Food Insecurity: No Food Insecurity   Worried About Charity fundraiser in the Last Year: Never true   Ran Out of Food in the Last Year: Never true  Transportation Needs: No Transportation Needs   Lack of Transportation (Medical): No   Lack of Transportation (Non-Medical): No  Physical Activity: Insufficiently Active   Days of Exercise per Week: 4 days   Minutes of Exercise per Session: 20 min  Stress: No Stress Concern Present   Feeling of Stress : Not at all  Social Connections: Moderately Isolated   Frequency of Communication with Friends and Family: More than three times a week   Frequency of Social Gatherings with Friends and Family: Twice a week   Attends Religious Services: More than 4 times per year   Active Member of [employer] or Organizations: No   Attends Arts development officer: Not on file   Marital Status: Never married  Intimate Partner Violence: Not on file    Outpatient Medications Prior to Visit  Medication Sig Dispense Refill   aspirin EC 81 MG tablet Take 1 tablet (81 mg total) by mouth daily. 90 tablet 3   betamethasone dipropionate 0.05 % cream Apply topically 2 (two) times daily as needed. 30 g 1   fenofibrate (TRICOR) 145 MG tablet Take 1 tablet (145 mg total) by mouth daily. 30 tablet 5   fluticasone (FLONASE) 50 MCG/ACT nasal spray Place 2 sprays into both nostrils daily. 16 g 5   nitroGLYCERIN (NITROSTAT) 0.4 MG SL tablet Place 1 tablet (0.4 mg total) under the tongue every 5 (five) minutes x 3 doses as needed for chest pain. 25 tablet 6   sodium chloride (OCEAN) 0.65 % SOLN nasal spray Place 1 spray into both nostrils as needed for congestion. 15 mL 0   hydrOXYzine (VISTARIL) 25 MG capsule Take 1 capsule (25 mg total) by mouth every 8 (eight) hours as needed for anxiety (or sleep). 30 capsule 1   No facility-administered medications prior to visit.    Allergies  Allergen Reactions   Ciprofloxacin     REACTION: nause-sweating-chest tightness   Atorvastatin     Myalgia    Crestor [Rosuvastatin Calcium] Nausea Only   Nexlizet [Bempedoic Acid-Ezetimibe] Other (See Comments)    myalgia   Livalo [Pitavastatin]     myalgia   Praluent [Alirocumab]     Muscle aches    ROS See HPI    Objective:    Physical Exam Constitutional:      General: She is not in acute distress.    Appearance: Normal appearance. She is well-developed.  HENT:     Head: Normocephalic and atraumatic.     Right Ear: External ear normal.     Left Ear: External ear normal.  Eyes:     General: No scleral icterus. Neck:     Thyroid: No thyromegaly.  Cardiovascular:     Rate and Rhythm: Normal rate and regular rhythm.     Heart sounds: Normal heart sounds. No murmur heard. Pulmonary:     Effort: Pulmonary effort is normal. No respiratory  distress.     Breath sounds: Normal breath sounds. No wheezing.  Chest:  Breasts:  Right: Normal.     Left: Normal.       Comments: Firm ovoid mass right lateral breast near axilla, mobile Abdominal:     General: There is no distension.     Palpations: Abdomen is soft. There is no mass.     Tenderness: There is no abdominal tenderness.  Musculoskeletal:     Cervical back: Neck supple.  Skin:    General: Skin is warm and dry.  Neurological:     Mental Status: She is alert and oriented to person, place, and time.  Psychiatric:        Mood and Affect: Mood normal.        Behavior: Behavior normal.        Thought Content: Thought content normal.        Judgment: Judgment normal.    BP (!) 155/85    Pulse 65    Temp 98.6 F (37 C) (Oral)    Resp 16    Wt 214 lb (97.1 kg)    SpO2 98%    BMI 33.52 kg/m  Wt Readings from Last 3 Encounters:  11/29/21 214 lb (97.1 kg)  08/16/21 211 lb (95.7 kg)  07/26/21 212 lb (96.2 kg)       Assessment & Plan:   Problem List Items Addressed This Visit       Unprioritized   Mass of axillary tail of right breast - Primary    New. Orders as below.       Relevant Orders   US BREAST COMPLETE UNI RIGHT INC AXILLA   MM DIAG BREAST TOMO BILATERAL   Grief reaction    Stable. Support provided. She decided not to use the atarax.  Monitor.       Essential hypertension    Uncontrolled. Add coreg 3.125mg  bid. She has tolerated this in the past.       Relevant Medications   carvedilol (COREG) 3.125 MG tablet    I have discontinued Hiya A. Privitera's hydrOXYzine. I am also having her start on carvedilol. Additionally, I am having her maintain her aspirin EC, nitroGLYCERIN, sodium chloride, betamethasone dipropionate, fenofibrate, and fluticasone.  Meds ordered this encounter  Medications   carvedilol (COREG) 3.125 MG tablet    Sig: Take 1 tablet (3.125 mg total) by mouth 2 (two) times daily with a meal.    Dispense:  60 tablet    Refill:   3    Order Specific Question:   Supervising Provider    Answer:   Penni Homans A [[id number]]

## 2021-11-29 NOTE — Assessment & Plan Note (Signed)
Uncontrolled. Add coreg 3.125mg  bid. She has tolerated this in the past.

## 2021-12-26 ENCOUNTER — Other Ambulatory Visit: Payer: BLUE CROSS/BLUE SHIELD

## 2021-12-27 ENCOUNTER — Ambulatory Visit: Payer: BLUE CROSS/BLUE SHIELD

## 2021-12-28 ENCOUNTER — Other Ambulatory Visit: Payer: Self-pay | Admitting: Family Medicine

## 2021-12-28 ENCOUNTER — Telehealth: Payer: Self-pay

## 2021-12-28 DIAGNOSIS — N6331 Unspecified lump in axillary tail of the right breast: Secondary | ICD-10-CM

## 2021-12-28 NOTE — Telephone Encounter (Signed)
Pt would need  new referral for u/s resent to Imaging downstairs. ?

## 2021-12-29 NOTE — Telephone Encounter (Signed)
Called home and cell numbers but no answer. Lvm for patient to call back to let her know [place] needs to be done at the breast center and to call [phone] to schedule. ?

## 2021-12-29 NOTE — Telephone Encounter (Signed)
Pt advised.

## 2021-12-29 NOTE — Telephone Encounter (Signed)
Diagnostic US is only performed at the [facility]. Please advise pt to call the Breast Center to schedule.  ?

## 2021-12-30 ENCOUNTER — Telehealth: Payer: Self-pay

## 2021-12-30 ENCOUNTER — Telehealth: Payer: Self-pay | Admitting: Family

## 2021-12-30 DIAGNOSIS — R2231 Localized swelling, mass and lump, right upper limb: Secondary | ICD-10-CM

## 2021-12-30 NOTE — Telephone Encounter (Signed)
Patient wishes to have done at [facility] instead.  ?

## 2021-12-30 NOTE — Telephone Encounter (Signed)
Pt called stating that her orders are still incorrect. She asks for Rod Holler to call her asap.  ?

## 2021-12-30 NOTE — Telephone Encounter (Addendum)
[facility] called stating they need  2 new orders for patient ? ? ? ? ? ?Bilateral dx mammogram ?[place] Right breast limited  ? ? ?Fax number ? ?[phone] ?

## 2021-12-30 NOTE — Telephone Encounter (Signed)
Patient advised new orders will be sent to [facility] today ?

## 2021-12-30 NOTE — Telephone Encounter (Signed)
Orders have been placed.

## 2022-01-03 ENCOUNTER — Ambulatory Visit: Payer: BLUE CROSS/BLUE SHIELD | Admitting: Family

## 2022-01-09 IMAGING — US US SOFT TISSUE
1 series · 14 of 25 positions shown · non-contrast
Comparison: None.

CLINICAL DATA: Possible mass in the left upper back. History of
lipoma.

EXAM:
ULTRASOUND OF left upper back SOFT TISSUES
TECHNIQUE: Ultrasound examination was performed in the area of clinical
concern.

[Series 1: us soft tissue · 31 acquisitions, 14 frames shown]
[im 1/31]
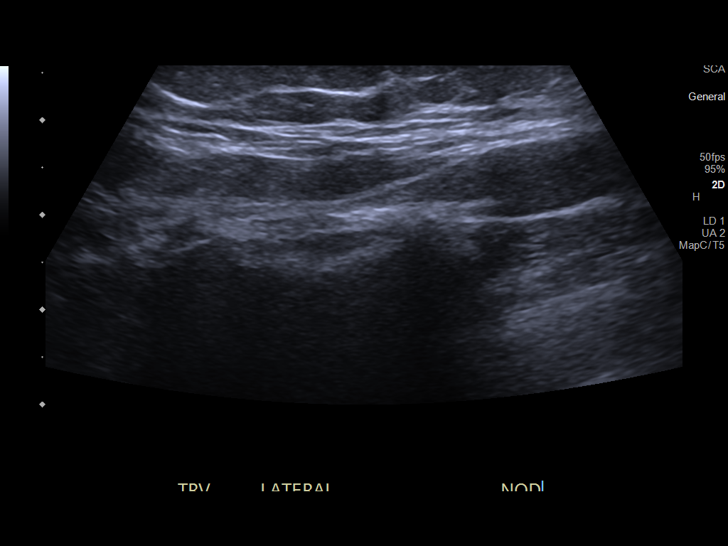
[im 3/31]
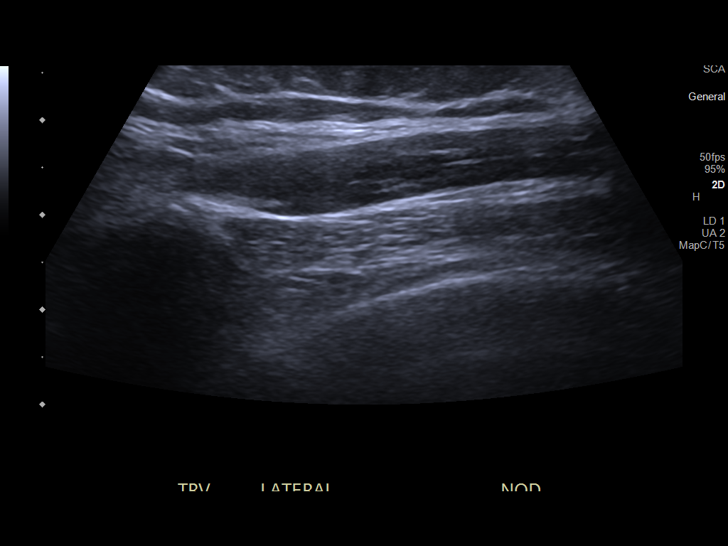
[im 6/31]
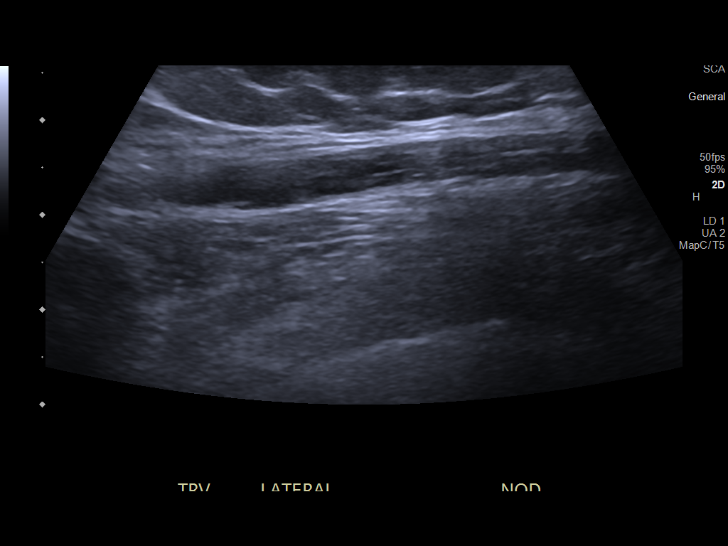
[im 8/31]
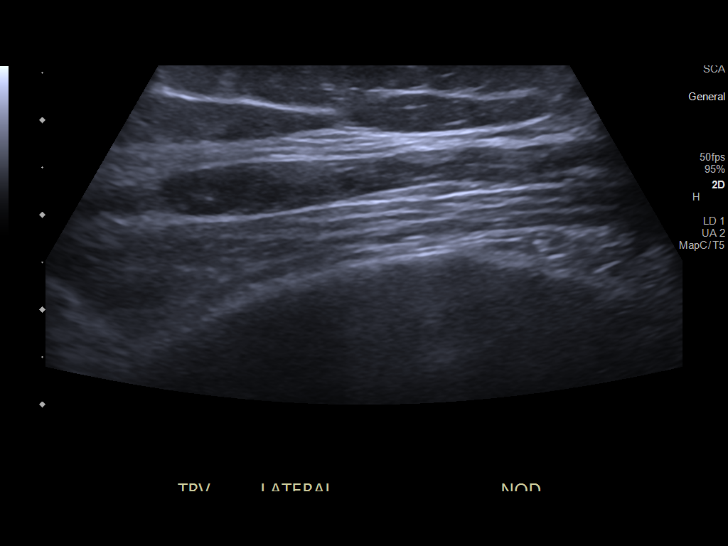
[im 11/31]
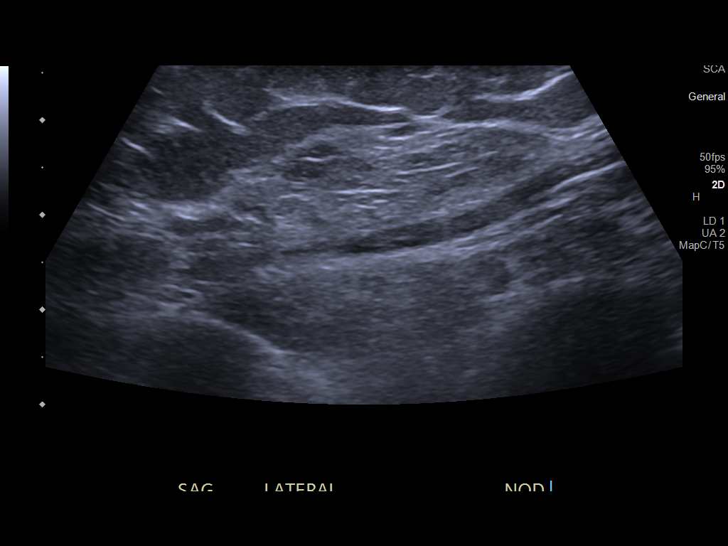
[im 12/31]
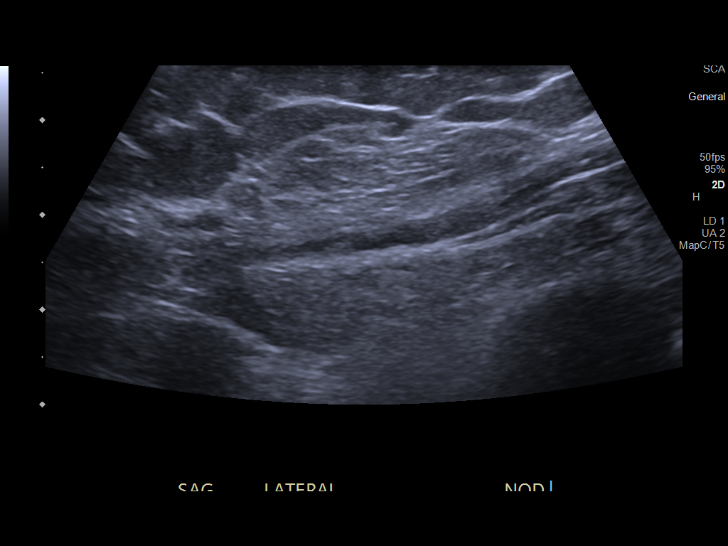
[im 14/31]
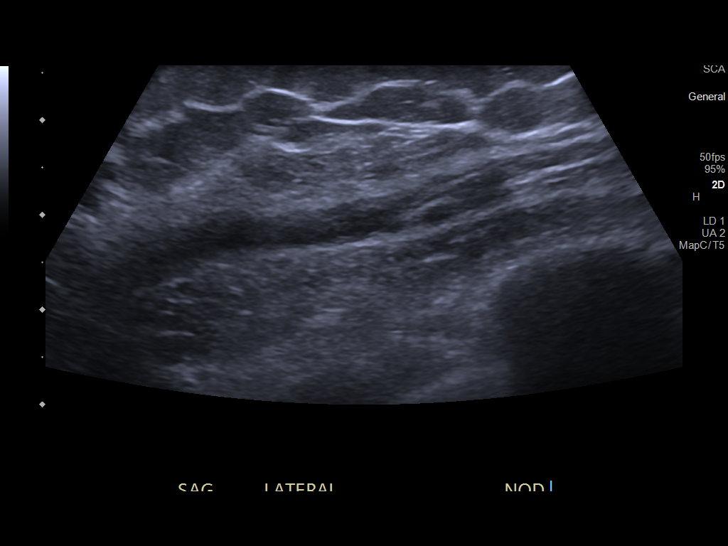
[im 17/31]
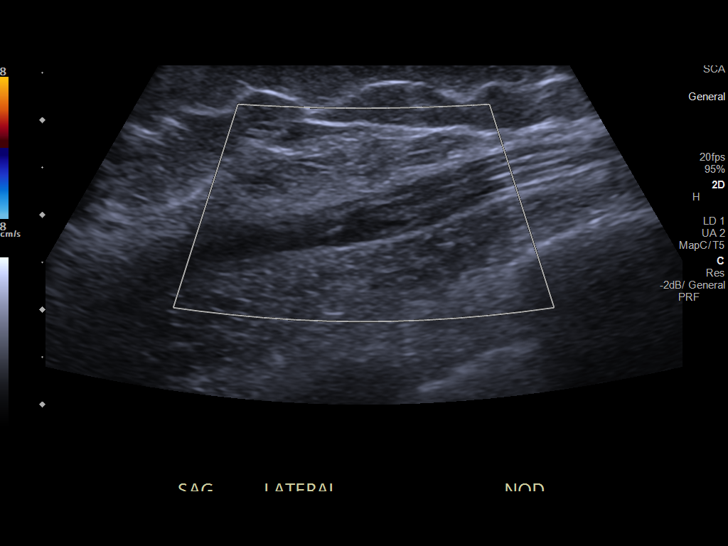
[im 19/31]
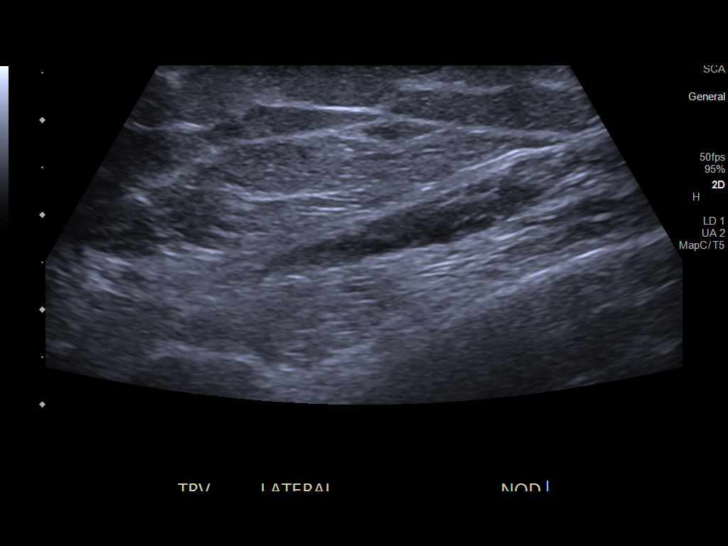
[im 21/31]
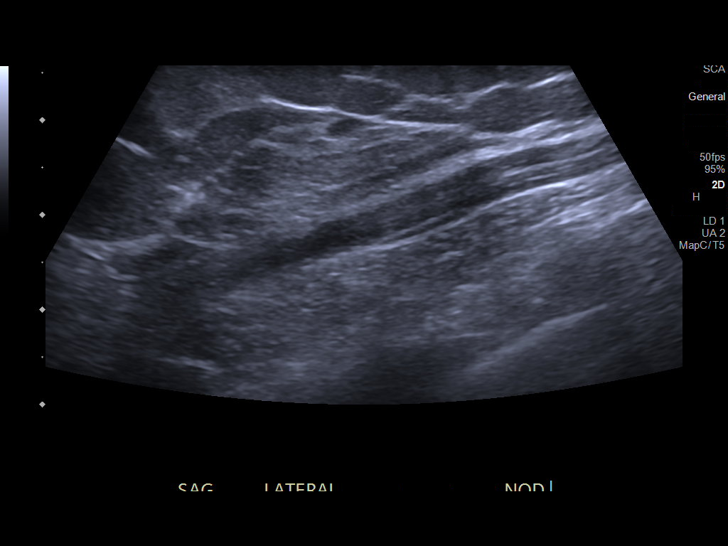
[im 23/31]
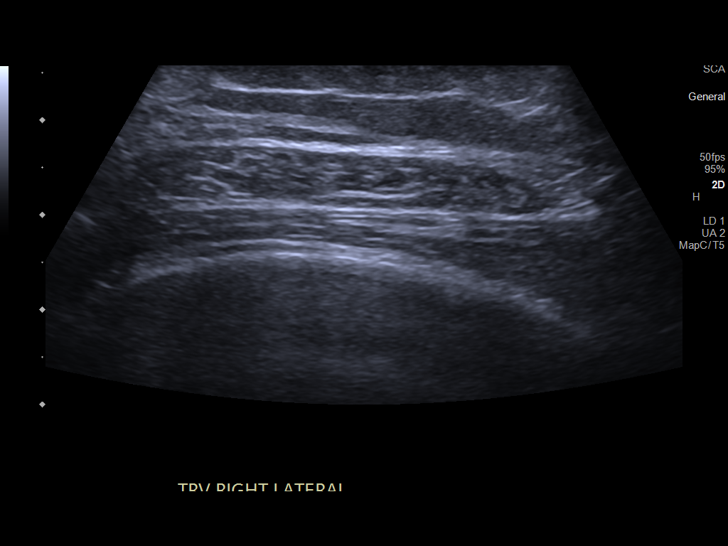
[im 26/31]
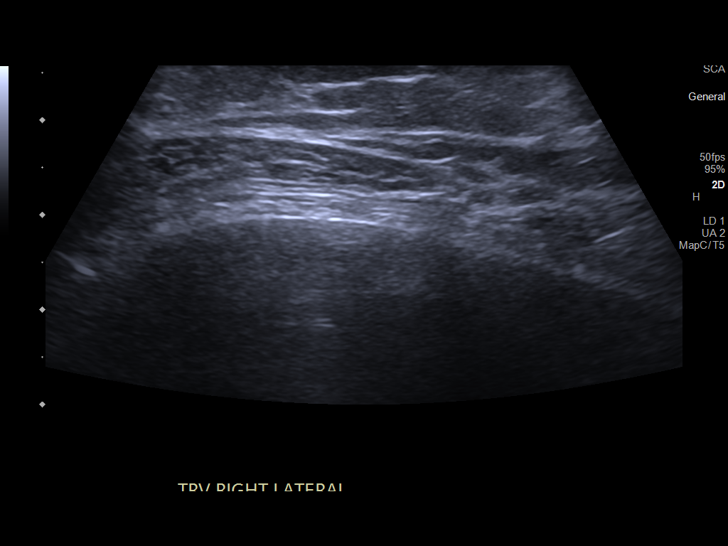
[im 28/31]
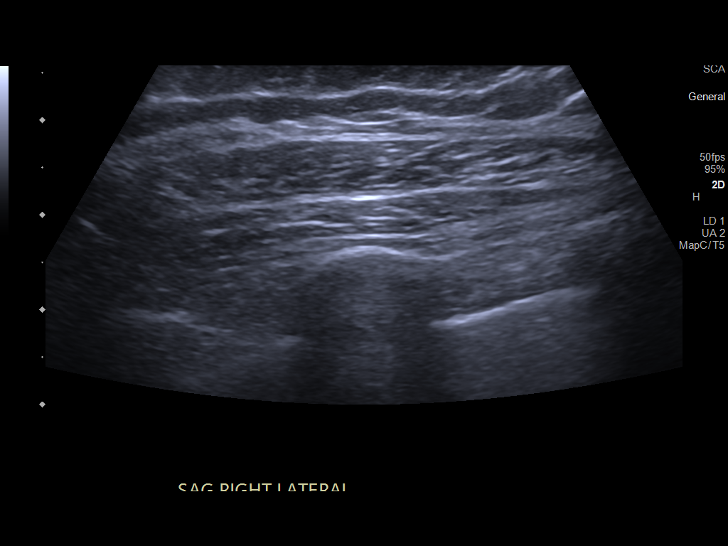
[im 31/31]
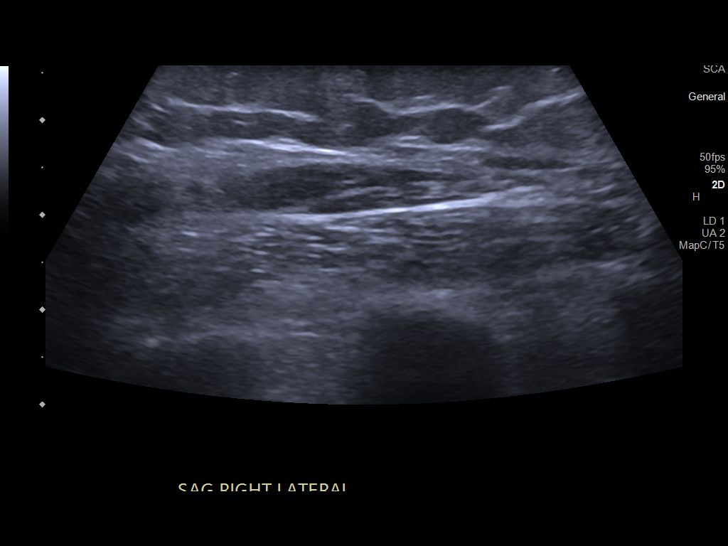

[14 of 25 positions shown; findings below may reference images not displayed]

FINDINGS: Targeted sonographic images of the soft tissues of the left upper
back in the region of the clinical concern was performed.

There is a 3.3 x 0.9 x 4.3 cm ovoid echogenic lesion with no
significant internal vascularity corresponding to the palpable lump
most suggestive of a lipoma. Clinical correlation and follow-up as
clinically indicated. If there is continued growth or other clinical
findings of concern, further evaluation with MRI is advised.
IMPRESSION: Ovoid lesion in the left upper back, likely a lipoma.

## 2022-01-10 LAB — HM MAMMOGRAPHY

## 2022-01-17 ENCOUNTER — Telehealth: Payer: Self-pay | Admitting: Family

## 2022-01-17 DIAGNOSIS — R2231 Localized swelling, mass and lump, right upper limb: Secondary | ICD-10-CM

## 2022-01-17 NOTE — Telephone Encounter (Signed)
Imaging requesting 3 D diag mammo order. States that they only received the [place] order.  I think [place] order was sent to [facility]? Can you double check and then forward the mammogram order please?  ?

## 2022-01-24 ENCOUNTER — Ambulatory Visit (INDEPENDENT_AMBULATORY_CARE_PROVIDER_SITE_OTHER): Payer: BLUE CROSS/BLUE SHIELD | Admitting: Family

## 2022-01-24 DIAGNOSIS — F4321 Adjustment disorder with depressed mood: Secondary | ICD-10-CM | POA: Diagnosis not present

## 2022-01-24 DIAGNOSIS — I10 Essential (primary) hypertension: Secondary | ICD-10-CM

## 2022-01-24 NOTE — Progress Notes (Signed)
? ?Subjective:  ? ?By signing my name below, I, Kaitlyn Bowman, attest that this documentation has been prepared under the direction and in the presence of [name] NP, 01/24/2022   ? ? Patient ID: Kaitlyn Bowman, female    DOB: [birth year], 61 y.o.   MRN: [id number] ? ?Chief Complaint  ?Patient presents with  ? Hypertension  ?  Here for follow up  ? ? ?HPI ?Patient is in today for an office visit. ? ?Stress - She has been stressed due to situational and personal circumstances. She has been trying to maintain her peace. ? ?Blood Pressure - As of today's visit, her blood pressure is increasing. She has not been taking her blood pressure medications.  ?BP Readings from Last 3 Encounters:  ?01/24/22 (!) 159/73  ?11/29/21 (!) 155/85  ?08/16/21 (!) 172/76  ? ?Pulse Readings from Last 3 Encounters:  ?01/24/22 62  ?11/29/21 65  ?08/16/21 63  ? ? ?There are no preventive care reminders to display for this patient. ? ?Past Medical History:  ?Diagnosis Date  ? Abnormal TSH   ? Alcohol abuse   ? Cocaine abuse ([place])   ? Remote crack cocaine use. UDS negative for cocaine 01/2012  ? Coronary artery disease   ?  inferior STEMI 01/13/12 s/p PTCA/DES to mid RCA. Initial EF 45% by cath but improved to 55-60% by echo 01/14/12  ? Dyslipidemia   ? Trig 201, HDL 36, LDL 197 01/2012  ? GERD (gastroesophageal reflux disease)   ? History of MI (myocardial infarction) 01/2012  ? Hypothyroidism   ? Myocardial infarction [facility]) 2012  ? Umbilical hernia   ? ? ?Past Surgical History:  ?Procedure Laterality Date  ? ABDOMINAL HYSTERECTOMY  1981  ? partial  ? CARDIAC CATHETERIZATION  2010, 2013  ? Negative. 2013--100% blockage  ? HERNIA REPAIR  2017  ? abd hernia   ? LEFT HEART CATHETERIZATION WITH CORONARY ANGIOGRAM N/A 01/12/2012  ? Procedure: LEFT HEART CATHETERIZATION WITH CORONARY ANGIOGRAM;  Surgeon: Burnell Blanks, MD;  Location: [facility] CATH LAB;  Service: Cardiovascular;  Laterality: N/A;  ? ? ?Family History  ?Problem Relation Age of  Onset  ? Heart attack Mother 10  ?     prior pacemaker  ? Heart disease Mother   ? Diabetes Mother   ? Heart failure Father   ?     CHF  ? Heart disease Father   ? Alcoholism Father   ? Stroke Brother   ? ? ?Social History  ? ?Socioeconomic History  ? Marital status: Single  ?  Spouse name: Not on file  ? Number of children: 1  ? Years of education: Not on file  ? Highest education level: GED or equivalent  ?Occupational History  ? Occupation: MANAGER  ?  Employer: [employer]  ?Tobacco Use  ? Smoking status: Former  ?  Years: 33.00  ?  Types: Cigarettes  ?  Quit date: 06/13/2008  ?  Years since quitting: 13.6  ? Smokeless tobacco: Never  ? Tobacco comments:  ?  None since 2009   ?Substance and Sexual Activity  ? Alcohol use: No  ?  Comment: former  ? Drug use: No  ?  Types: "Crack" cocaine  ?  Comment: Crack cocaine; none since Jan 2006   ? Sexual activity: Not on file  ?Other Topics Concern  ? Not on file  ?Social History Narrative  ? Regular exercise:  Walks 5-7 x weekly  ? Caffeine use:  1  daily  ? 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 Single  ? Daughter age 24 and 33 yr old grandson- daughter lives in [place].  ? Enjoys reading, sewing, Fill in puzzles, walking  ? Completed GED.  ? No pets.  ?   ?   ?   ?   ? ?Social Determinants of Health  ? ?Financial Resource Strain: Low Risk   ? Difficulty of Paying Living Expenses: Not very hard  ?Food Insecurity: No Food Insecurity  ? Worried About Charity fundraiser in the Last Year: Never true  ? Ran Out of Food in the Last Year: Never true  ?Transportation Needs: No Transportation Needs  ? Lack of Transportation (Medical): No  ? Lack of Transportation (Non-Medical): No  ?Physical Activity: Insufficiently Active  ? Days of Exercise per Week: 4 days  ? Minutes of Exercise per Session: 20 min  ?Stress: No Stress Concern Present  ? Feeling of Stress : Not at all  ?Social Connections: Moderately Isolated  ? Frequency of Communication with Friends and Family: More than three  times a week  ? Frequency of Social Gatherings with Friends and Family: Twice a week  ? Attends Religious Services: More than 4 times per year  ? Active Member of Clubs or Organizations: No  ? Attends Archivist Meetings: Not on file  ? Marital Status: Never married  ?Intimate Partner Violence: Not on file  ? ? ?Outpatient Medications Prior to Visit  ?Medication Sig Dispense Refill  ? aspirin EC 81 MG tablet Take 1 tablet (81 mg total) by mouth daily. 90 tablet 3  ? betamethasone dipropionate 0.05 % cream Apply topically 2 (two) times daily as needed. 30 g 1  ? carvedilol (COREG) 3.125 MG tablet Take 1 tablet (3.125 mg total) by mouth 2 (two) times daily with a meal. 60 tablet 3  ? fenofibrate (TRICOR) 145 MG tablet Take 1 tablet (145 mg total) by mouth daily. 30 tablet 5  ? fluticasone (FLONASE) 50 MCG/ACT nasal spray Place 2 sprays into both nostrils daily. 16 g 5  ? nitroGLYCERIN (NITROSTAT) 0.4 MG SL tablet Place 1 tablet (0.4 mg total) under the tongue every 5 (five) minutes x 3 doses as needed for chest pain. 25 tablet 6  ? sodium chloride (OCEAN) 0.65 % SOLN nasal spray Place 1 spray into both nostrils as needed for congestion. 15 mL 0  ? ?No facility-administered medications prior to visit.  ? ? ?Allergies  ?Allergen Reactions  ? Ciprofloxacin   ?  REACTION: nause-sweating-chest tightness  ? Atorvastatin   ?  Myalgia ?  ? Crestor [Rosuvastatin Calcium] Nausea Only  ? Nexlizet [Bempedoic Acid-Ezetimibe] Other (See Comments)  ?  myalgia  ? Livalo [Pitavastatin]   ?  myalgia  ? Praluent [Alirocumab]   ?  Muscle aches  ? ? ?ROS ? ?   ?Objective:  ?  ?Physical Exam ?Constitutional:   ?   General: She is not in acute distress. ?   Appearance: Normal appearance. She is not ill-appearing.  ?HENT:  ?   Head: Normocephalic and atraumatic.  ?   Right Ear: External ear normal.  ?   Left Ear: External ear normal.  ?Eyes:  ?   Extraocular Movements: Extraocular movements intact.  ?   Pupils: Pupils are equal,  round, and reactive to light.  ?Cardiovascular:  ?   Rate and Rhythm: Normal rate and regular rhythm.  ?   Heart sounds: No murmur heard. ?  No gallop.  ?Pulmonary:  ?   Effort: Pulmonary effort is normal. No respiratory distress.  ?   Breath sounds: Normal breath sounds. No wheezing or rales.  ?Skin: ?   General: Skin is warm and dry.  ?   Comments: Lipoma Left Lower Lateral Thigh  ?Neurological:  ?   Mental Status: She is alert and oriented to person, place, and time.  ?Psychiatric:     ?   Mood and Affect: Mood normal.     ?   Behavior: Behavior normal.     ?   Judgment: Judgment normal.  ? ? ?BP (!) 159/73 (BP Location: Right Arm, Patient Position: Sitting, Cuff Size: Large)   Pulse 62   Temp 98 ?F (36.7 ?C) (Oral)   Resp 16   Wt 217 lb (98.4 kg)   SpO2 97%   BMI 33.99 kg/m?  ?Wt Readings from Last 3 Encounters:  ?01/24/22 217 lb (98.4 kg)  ?11/29/21 214 lb (97.1 kg)  ?08/16/21 211 lb (95.7 kg)  ? ? ?   ?Assessment & Plan:  ? ?Problem List Items Addressed This Visit   ? ?  ? Unprioritized  ? Grief reaction  ?  Improving. Her grandson is doing better.  ? ?  ?  ? Essential hypertension  ?  BP still above goal. Encouraged patient to start coreg.  ? ?  ?  ? ? ? ?No orders of the defined types were placed in this encounter. ? ? ?I, Nance Pear, NP, personally preformed the services described in this documentation.  All medical record entries made by the scribe were at my direction and in my presence.  I have reviewed the chart and discharge instructions (if applicable) and agree that the record reflects my personal performance and is accurate and complete. 01/24/2022 ? ? ?I,Amber Collins,acting as a Education administrator for [employer], NP.,have documented all relevant documentation on the behalf of Nance Pear, NP,as directed by  Nance Pear, NP while in the presence of Nance Pear, NP. ? ? ? ?Nance Pear, NP ? ?

## 2022-01-24 NOTE — Assessment & Plan Note (Signed)
BP still above goal. Encouraged patient to start coreg.  ?

## 2022-01-24 NOTE — Assessment & Plan Note (Signed)
Improving. Her grandson is doing better.  ?

## 2022-01-24 NOTE — Patient Instructions (Signed)
Please start carvedilol. ?Check blood pressure once daily for a few days along with heart rate. Send me your readings via mychart.  ?

## 2022-01-31 ENCOUNTER — Telehealth (INDEPENDENT_AMBULATORY_CARE_PROVIDER_SITE_OTHER): Payer: BLUE CROSS/BLUE SHIELD | Admitting: Family

## 2022-01-31 ENCOUNTER — Encounter: Payer: Self-pay | Admitting: Family

## 2022-01-31 VITALS — BP 158/93 | HR 67

## 2022-01-31 DIAGNOSIS — I10 Essential (primary) hypertension: Secondary | ICD-10-CM

## 2022-01-31 DIAGNOSIS — M25512 Pain in left shoulder: Secondary | ICD-10-CM | POA: Insufficient documentation

## 2022-01-31 MED ORDER — MELOXICAM 7.5 MG PO TABS: 7.5000 mg | ORAL_TABLET | Freq: Every day | ORAL | 0 refills | Status: DC

## 2022-01-31 NOTE — Assessment & Plan Note (Addendum)
BP Readings from Last 3 Encounters:  ?01/31/22 (!) 158/93  ?01/24/22 (!) 159/73  ?11/29/21 (!) 155/85  ? ?Uncontrolled. Will increase carvedilol from 3.125 to 6.25 bid. See mychart messages.  ?

## 2022-01-31 NOTE — Assessment & Plan Note (Signed)
Has soreness. Will rx with short course of meloxicam.  ?

## 2022-01-31 NOTE — Progress Notes (Addendum)
? ? ?MyChart Video Visit ? ? ? ?Virtual Visit via Video Note  ? ?This visit type was conducted due to national recommendations for restrictions regarding the COVID-19 Pandemic (e.g. social distancing) in an effort to limit this patient's exposure and mitigate transmission in our community. This patient is at least at moderate risk for complications without adequate follow up. This format is felt to be most appropriate for this patient at this time. Physical exam was limited by quality of the video and audio technology used for the visit. CMA was able to get the patient set up on a video visit. ? ?Patient location: Home Patient and provider in visit ?Provider location: Office ? ?I discussed the limitations of evaluation and management by telemedicine and the availability of in person appointments. The patient expressed understanding and agreed to proceed. ? ?Visit Date: 01/31/2022 ?Today's healthcare provider: Nance Pear, NP  ? ? ? ?Subjective:  ? ? Patient ID: Kaitlyn Bowman, female    DOB: 09-07-1961, 61 y.o.   MRN: [id number] ? ?Chief Complaint  ?Patient presents with  ? Shoulder Injury  ?  Complains of left shoulder pain after injury about one month ago  ? Hypertension  ?  "Wanted to let provider know she is doing good"  ? ? ?Shoulder Injury  ? ?Hypertension ? ?Patient is in today for a virtual office visit ? ?Blood Pressure - She has been taking 3.125 MG of Coreg. Since taking the medication, her headaches have been resolved. She states that she feels a positive difference in her body. She has been recording her blood pressure and states that it has consistently been 149/71. ?BP Readings from Last 3 Encounters:  ?01/31/22 (!) 158/93  ?01/24/22 (!) 159/73  ?11/29/21 (!) 155/85  ? ?Left Shoulder Pain - About a month ago, she got out a car and hit her left shoulder on the edge of the car door. Since then, she has been experiencing pain in her left shoulder. She also sleeps on her left shoulder. She uses a  heating pad to help her symptoms. She can still bend/rotate her shoulder and therefore does not believe that she fractured it. ? ?Past Medical History:  ?Diagnosis Date  ? Abnormal TSH   ? Alcohol abuse   ? Cocaine abuse ([place])   ? Remote crack cocaine use. UDS negative for cocaine 01/2012  ? Coronary artery disease   ?  inferior STEMI 01/13/12 s/p PTCA/DES to mid RCA. Initial EF 45% by cath but improved to 55-60% by echo 01/14/12  ? Dyslipidemia   ? Trig 201, HDL 36, LDL 197 01/2012  ? GERD (gastroesophageal reflux disease)   ? History of MI (myocardial infarction) 01/2012  ? Hypothyroidism   ? Myocardial infarction [facility]) 2012  ? Umbilical hernia   ? ? ?Past Surgical History:  ?Procedure Laterality Date  ? ABDOMINAL HYSTERECTOMY  1981  ? partial  ? CARDIAC CATHETERIZATION  2010, 2013  ? Negative. 2013--100% blockage  ? HERNIA REPAIR  2017  ? abd hernia   ? LEFT HEART CATHETERIZATION WITH CORONARY ANGIOGRAM N/A 01/12/2012  ? Procedure: LEFT HEART CATHETERIZATION WITH CORONARY ANGIOGRAM;  Surgeon: Burnell Blanks, MD;  Location: [facility] CATH LAB;  Service: Cardiovascular;  Laterality: N/A;  ? ? ?Family History  ?Problem Relation Age of Onset  ? Heart attack Mother 19  ?     prior pacemaker  ? Heart disease Mother   ? Diabetes Mother   ? Heart failure Father   ?  CHF  ? Heart disease Father   ? Alcoholism Father   ? Stroke Brother   ? ? ?Social History  ? ?Socioeconomic History  ? Marital status: Single  ?  Spouse name: Not on file  ? Number of children: 1  ? Years of education: Not on file  ? Highest education level: GED or equivalent  ?Occupational History  ? Occupation: MANAGER  ?  Employer: [employer]  ?Tobacco Use  ? Smoking status: Former  ?  Years: 33.00  ?  Types: Cigarettes  ?  Quit date: 06/13/2008  ?  Years since quitting: 13.6  ? Smokeless tobacco: Never  ? Tobacco comments:  ?  None since 2009   ?Substance and Sexual Activity  ? Alcohol use: No  ?  Comment: former  ? Drug use: No  ?  Types: "Crack" cocaine  ?   Comment: Crack cocaine; none since Jan 2006   ? Sexual activity: Not on file  ?Other Topics Concern  ? Not on file  ?Social History Narrative  ? Regular exercise:  Walks 5-7 x weekly  ? Caffeine use:  1 daily  ? 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 Single  ? Daughter age 30 and 85 yr old grandson- daughter lives in [place].  ? Enjoys reading, sewing, Fill in puzzles, walking  ? Completed GED.  ? No pets.  ?   ?   ?   ?   ? ?Social Determinants of Health  ? ?Financial Resource Strain: Low Risk   ? Difficulty of Paying Living Expenses: Not very hard  ?Food Insecurity: No Food Insecurity  ? Worried About Charity fundraiser in the Last Year: Never true  ? Ran Out of Food in the Last Year: Never true  ?Transportation Needs: No Transportation Needs  ? Lack of Transportation (Medical): No  ? Lack of Transportation (Non-Medical): No  ?Physical Activity: Insufficiently Active  ? Days of Exercise per Week: 4 days  ? Minutes of Exercise per Session: 20 min  ?Stress: No Stress Concern Present  ? Feeling of Stress : Not at all  ?Social Connections: Moderately Isolated  ? Frequency of Communication with Friends and Family: More than three times a week  ? Frequency of Social Gatherings with Friends and Family: Twice a week  ? Attends Religious Services: More than 4 times per year  ? Active Member of Clubs or Organizations: No  ? Attends Archivist Meetings: Not on file  ? Marital Status: Never married  ?Intimate Partner Violence: Not on file  ? ? ?Outpatient Medications Prior to Visit  ?Medication Sig Dispense Refill  ? aspirin EC 81 MG tablet Take 1 tablet (81 mg total) by mouth daily. 90 tablet 3  ? betamethasone dipropionate 0.05 % cream Apply topically 2 (two) times daily as needed. 30 g 1  ? carvedilol (COREG) 3.125 MG tablet Take 1 tablet (3.125 mg total) by mouth 2 (two) times daily with a meal. 60 tablet 3  ? fenofibrate (TRICOR) 145 MG tablet Take 1 tablet (145 mg total) by mouth daily. 30 tablet 5  ?  fluticasone (FLONASE) 50 MCG/ACT nasal spray Place 2 sprays into both nostrils daily. 16 g 5  ? nitroGLYCERIN (NITROSTAT) 0.4 MG SL tablet Place 1 tablet (0.4 mg total) under the tongue every 5 (five) minutes x 3 doses as needed for chest pain. 25 tablet 6  ? sodium chloride (OCEAN) 0.65 % SOLN nasal spray Place 1 spray into  both nostrils as needed for congestion. 15 mL 0  ? ?No facility-administered medications prior to visit.  ? ? ?Allergies  ?Allergen Reactions  ? Ciprofloxacin   ?  REACTION: nause-sweating-chest tightness  ? Atorvastatin   ?  Myalgia ?  ? Crestor [Rosuvastatin Calcium] Nausea Only  ? Nexlizet [Bempedoic Acid-Ezetimibe] Other (See Comments)  ?  myalgia  ? Livalo [Pitavastatin]   ?  myalgia  ? Praluent [Alirocumab]   ?  Muscle aches  ? ? ?Review of Systems  ?Musculoskeletal:  Positive for joint pain (Left Shoulder).  ? ?   ?Objective:  ?  ?Physical Exam ? ?BP (!) 158/93   Pulse 67  ?Wt Readings from Last 3 Encounters:  ?01/24/22 217 lb (98.4 kg)  ?11/29/21 214 lb (97.1 kg)  ?08/16/21 211 lb (95.7 kg)  ? ? ?Diabetic Foot Exam - Simple   ?No data filed ?  ? ?Lab Results  ?Component Value Date  ? WBC 8.7 06/01/2020  ? HGB 13.0 06/01/2020  ? HCT 40.3 06/01/2020  ? PLT 310 06/01/2020  ? GLUCOSE 103 (H) 07/26/2021  ? CHOL 225 (H) 07/26/2021  ? TRIG 246.0 (H) 07/26/2021  ? HDL 44.30 07/26/2021  ? LDLDIRECT 130.0 07/26/2021  ? [place] 88 01/11/2021  ? ALT 19 07/26/2021  ? AST 18 07/26/2021  ? NA 140 07/26/2021  ? K 4.4 07/26/2021  ? CL 104 07/26/2021  ? CREATININE 0.78 07/26/2021  ? BUN 10 07/26/2021  ? CO2 28 07/26/2021  ? TSH 4.08 07/26/2021  ? INR 2.10 (H) 01/12/2012  ? HGBA1C 5.9 (H) 07/19/2018  ? ? ?Lab Results  ?Component Value Date  ? TSH 4.08 07/26/2021  ? ?Lab Results  ?Component Value Date  ? WBC 8.7 06/01/2020  ? HGB 13.0 06/01/2020  ? HCT 40.3 06/01/2020  ? MCV 79.6 (L) 06/01/2020  ? PLT 310 06/01/2020  ? ?Lab Results  ?Component Value Date  ? NA 140 07/26/2021  ? K 4.4 07/26/2021  ? CO2 28  07/26/2021  ? GLUCOSE 103 (H) 07/26/2021  ? BUN 10 07/26/2021  ? CREATININE 0.78 07/26/2021  ? BILITOT 0.3 07/26/2021  ? ALKPHOS 69 07/26/2021  ? AST 18 07/26/2021  ? ALT 19 07/26/2021  ? PROT 7.2 07/26/2021  ? ALB

## 2022-02-01 ENCOUNTER — Encounter: Payer: Self-pay | Admitting: Family

## 2022-02-02 ENCOUNTER — Encounter: Payer: Self-pay | Admitting: Family

## 2022-02-02 MED ORDER — CARVEDILOL 6.25 MG PO TABS
6.2500 mg | ORAL_TABLET | Freq: Two times a day (BID) | ORAL | 3 refills | Status: DC
Start: 2022-02-02 — End: 2022-03-03

## 2022-02-11 ENCOUNTER — Other Ambulatory Visit: Payer: Self-pay | Admitting: Family

## 2022-02-11 DIAGNOSIS — M25512 Pain in left shoulder: Secondary | ICD-10-CM

## 2022-02-24 ENCOUNTER — Other Ambulatory Visit: Payer: Self-pay | Admitting: Family

## 2022-02-24 DIAGNOSIS — M25512 Pain in left shoulder: Secondary | ICD-10-CM

## 2022-03-01 ENCOUNTER — Telehealth: Payer: Self-pay | Admitting: Family

## 2022-03-01 NOTE — Telephone Encounter (Signed)
Pt would like for [facility] to contact her to discuss blood pressure meds

## 2022-03-01 NOTE — Telephone Encounter (Signed)
Lvm for patient to call back

## 2022-03-03 MED ORDER — CARVEDILOL 3.125 MG PO TABS
3.1250 mg | ORAL_TABLET | Freq: Two times a day (BID) | ORAL | 3 refills | Status: DC
Start: 2022-03-03 — End: 2022-03-21

## 2022-03-03 NOTE — Telephone Encounter (Signed)
Patient was advised or new rx

## 2022-03-03 NOTE — Telephone Encounter (Signed)
Rx sent for 3.'125mg'$  tabs of carvedilol.

## 2022-03-03 NOTE — Addendum Note (Signed)
Addended by: [name] on: 03/03/2022 12:23 PM   Modules accepted: Orders

## 2022-03-07 ENCOUNTER — Other Ambulatory Visit: Payer: Self-pay | Admitting: Family

## 2022-03-07 DIAGNOSIS — M25512 Pain in left shoulder: Secondary | ICD-10-CM

## 2022-03-07 NOTE — Telephone Encounter (Signed)
Please advise pt that I would like her to switch from meloxicam to tylenol which is safer long term. I have denied rx request for this reason.

## 2022-03-07 NOTE — Telephone Encounter (Signed)
Per patient she has not been taking Meloxicam. Patient advised if needed she can take tylenol instead She verbalized understanding

## 2022-03-21 ENCOUNTER — Ambulatory Visit (HOSPITAL_BASED_OUTPATIENT_CLINIC_OR_DEPARTMENT_OTHER)
Admission: RE | Admit: 2022-03-21 | Discharge: 2022-03-21 | Disposition: A | Discharge status: Home / Self Care | Payer: BLUE CROSS/BLUE SHIELD | Source: Ambulatory Visit | Attending: Family | Admitting: Family

## 2022-03-21 ENCOUNTER — Ambulatory Visit (INDEPENDENT_AMBULATORY_CARE_PROVIDER_SITE_OTHER): Payer: BLUE CROSS/BLUE SHIELD | Admitting: Family

## 2022-03-21 VITALS — BP 163/69 | HR 63 | Temp 98.0°F | Resp 16 | Wt 220.0 lb

## 2022-03-21 DIAGNOSIS — R059 Cough, unspecified: Secondary | ICD-10-CM

## 2022-03-21 DIAGNOSIS — J40 Bronchitis, not specified as acute or chronic: Secondary | ICD-10-CM | POA: Diagnosis not present

## 2022-03-21 DIAGNOSIS — I10 Essential (primary) hypertension: Secondary | ICD-10-CM

## 2022-03-21 MED ORDER — CARVEDILOL 3.125 MG PO TABS: 6.2500 mg | ORAL_TABLET | Freq: Two times a day (BID) | ORAL | 3 refills | Status: DC

## 2022-03-21 MED ORDER — AZITHROMYCIN 250 MG PO TABS
ORAL_TABLET | ORAL | 0 refills | Status: AC
Start: 2022-03-21 — End: 2022-03-26

## 2022-03-21 NOTE — Patient Instructions (Signed)
Please begin delsym twice daily. Start zpak.  Complete x-ray on the first floor. Increase your carvedilol to 2 tabs twice daily.

## 2022-03-21 NOTE — Progress Notes (Signed)
Subjective:   By signing my name below, I, [name], attest that this documentation has been prepared under the direction and in the presence of [name] NP, 03/21/2022   Patient ID: Kaitlyn Bowman, female    DOB: 18-Jan-1961, 61 y.o.   MRN: [id number]  Chief Complaint  Patient presents with   Follow-up    Here for ED follow up    HPI Patient is in today for an office visit  Cough - She reports that since being discharged, she still has a cough. She also still has nasal congestion with pressure and pain in her sinus areas. She also complains of ear tenderness. She overall feels "off". Symptoms begun with a tingling in her throat on 03/11/2022. She then went to the ED on 03/12/2022 and was discharged on the same day. She is requesting to get prescribed a Zpack. She states that Tessalon Pearls does not improve her symptoms Blood Pressure - As of today's visit, her blood pressure is elevating. She is currently taking one tablet of 3.125 Mg of Cavedilol twice a day. She states that she has not taken her medication prior to today's visit. She reports that she has experienced dizziness last time the dosage was increased. She is not interested in being prescribed in an additional medication.  BP Readings from Last 3 Encounters:  03/21/22 (!) 163/69  01/31/22 (!) 158/93  01/24/22 (!) 159/73   Pulse Readings from Last 3 Encounters:  03/21/22 63  01/31/22 67  01/24/22 62  Heart Flutter - She complains of a possible heart flutter. She states that the ED department wanted her to get an X-ray.   Health Maintenance Due  Topic Date Due   TETANUS/TDAP  02/20/2022    Past Medical History:  Diagnosis Date   Abnormal TSH    Alcohol abuse    Cocaine abuse ([place])    Remote crack cocaine use. UDS negative for cocaine 01/2012   Coronary artery disease     inferior STEMI 01/13/12 s/p PTCA/DES to mid RCA. Initial EF 45% by cath but improved to 55-60% by echo 01/14/12   Dyslipidemia    Trig  201, HDL 36, LDL 197 01/2012   GERD (gastroesophageal reflux disease)    History of MI (myocardial infarction) 01/2012   Hypothyroidism    Myocardial infarction [facility]) 3810   Umbilical hernia     Past Surgical History:  Procedure Laterality Date   ABDOMINAL HYSTERECTOMY  1981   partial   CARDIAC CATHETERIZATION  2010, 2013   Negative. 2013--100% blockage   HERNIA REPAIR  2017   abd hernia    LEFT HEART CATHETERIZATION WITH CORONARY ANGIOGRAM N/A 01/12/2012   Procedure: LEFT HEART CATHETERIZATION WITH CORONARY ANGIOGRAM;  Surgeon: Burnell Blanks, MD;  Location: [facility] CATH LAB;  Service: Cardiovascular;  Laterality: N/A;    Family History  Problem Relation Age of Onset   Heart attack Mother 17       prior pacemaker   Heart disease Mother    Diabetes Mother    Heart failure Father        CHF   Heart disease Father    Alcoholism Father    Stroke Brother     Social History   Socioeconomic History   Marital status: Single    Spouse name: Not on file   Number of children: 1   Years of education: Not on file   Highest education level: GED or equivalent  Occupational History   Occupation: Freight forwarder  Employer: [employer]  Tobacco Use   Smoking status: Former    Years: 33.00    Types: Cigarettes    Quit date: 06/13/2008    Years since quitting: 13.7   Smokeless tobacco: Never   Tobacco comments:    None since 2009   Substance and Sexual Activity   Alcohol use: No    Comment: former   Drug use: No    Types: "Crack" cocaine    Comment: Crack cocaine; none since Jan 2006    Sexual activity: Not on file  Other Topics Concern   Not on file  Social History Narrative   Regular exercise:  Walks 5-7 x weekly   Caffeine use:  1 daily   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   Enjoys reading, sewing, Fill in puzzles, walking   Completed GED.   No pets.               Social Determinants of  Health   Financial Resource Strain: Low Risk  (02/21/2021)   Overall Financial Resource Strain (CARDIA)    Difficulty of Paying Living Expenses: Not very hard  Food Insecurity: No Food Insecurity (02/21/2021)   Hunger Vital Sign    Worried About Running Out of Food in the Last Year: Never true    Ran Out of Food in the Last Year: Never true  Transportation Needs: No Transportation Needs (02/21/2021)   PRAPARE - Hydrologist (Medical): No    Lack of Transportation (Non-Medical): No  Physical Activity: Insufficiently Active (02/21/2021)   Exercise Vital Sign    Days of Exercise per Week: 4 days    Minutes of Exercise per Session: 20 min  Stress: No Stress Concern Present (02/21/2021)   [place]    Feeling of Stress : Not at all  Social Connections: Moderately Isolated (02/21/2021)   Social Connection and Isolation Panel [NHANES]    Frequency of Communication with Friends and Family: More than three times a week    Frequency of Social Gatherings with Friends and Family: Twice a week    Attends Religious Services: More than 4 times per year    Active Member of [employer] or Organizations: No    Attends Music therapist: Not on file    Marital Status: Never married  Intimate Partner Violence: Not on file    Outpatient Medications Prior to Visit  Medication Sig Dispense Refill   aspirin EC 81 MG tablet Take 1 tablet (81 mg total) by mouth daily. 90 tablet 3   fluticasone (FLONASE) 50 MCG/ACT nasal spray Place 2 sprays into both nostrils daily. 16 g 5   nitroGLYCERIN (NITROSTAT) 0.4 MG SL tablet Place 1 tablet (0.4 mg total) under the tongue every 5 (five) minutes x 3 doses as needed for chest pain. 25 tablet 6   sodium chloride (OCEAN) 0.65 % SOLN nasal spray Place 1 spray into both nostrils as needed for congestion. 15 mL 0   betamethasone dipropionate 0.05 % cream Apply topically 2 (two)  times daily as needed. 30 g 1   carvedilol (COREG) 3.125 MG tablet Take 1 tablet (3.125 mg total) by mouth 2 (two) times daily with a meal. 60 tablet 3   meloxicam (MOBIC) 7.5 MG tablet TAKE 1 TABLET(7.5 MG) BY MOUTH DAILY 14 tablet 0   fenofibrate (  TRICOR) 145 MG tablet Take 1 tablet (145 mg total) by mouth daily. (Patient not taking: Reported on 03/21/2022) 30 tablet 5   No facility-administered medications prior to visit.    Allergies  Allergen Reactions   Ciprofloxacin     REACTION: nause-sweating-chest tightness   Atorvastatin     Myalgia    Crestor [Rosuvastatin Calcium] Nausea Only   Nexlizet [Bempedoic Acid-Ezetimibe] Other (See Comments)    myalgia   Livalo [Pitavastatin]     myalgia   Praluent [Alirocumab]     Muscle aches    Review of Systems  HENT:  Positive for congestion, ear pain and sinus pain.   Respiratory:  Positive for cough.        Objective:    Physical Exam Constitutional:      General: She is not in acute distress.    Appearance: Normal appearance. She is not ill-appearing.  HENT:     Head: Normocephalic and atraumatic.     Right Ear: External ear normal.     Left Ear: External ear normal.     Nose:     Right Sinus: Maxillary sinus tenderness present. No frontal sinus tenderness.     Left Sinus: Maxillary sinus tenderness present. No frontal sinus tenderness.  Eyes:     Extraocular Movements: Extraocular movements intact.     Pupils: Pupils are equal, round, and reactive to light.  Cardiovascular:     Rate and Rhythm: Normal rate and regular rhythm.     Heart sounds: Normal heart sounds. No murmur heard.    No gallop.  Pulmonary:     Effort: Pulmonary effort is normal. No respiratory distress.     Breath sounds: Normal breath sounds. No wheezing or rales.  Skin:    General: Skin is warm and dry.  Neurological:     Mental Status: She is alert and oriented to person, place, and time.  Psychiatric:        Mood and Affect: Mood normal.         Behavior: Behavior normal.        Judgment: Judgment normal.     BP (!) 163/69 (BP Location: Left Arm, Patient Position: Sitting, Cuff Size: Small)   Pulse 63   Temp 98 F (36.7 C) (Oral)   Resp 16   Wt 220 lb (99.8 kg)   SpO2 99%   BMI 34.46 kg/m  Wt Readings from Last 3 Encounters:  03/21/22 220 lb (99.8 kg)  01/24/22 217 lb (98.4 kg)  11/29/21 214 lb (97.1 kg)       Assessment & Plan:   Problem List Items Addressed This Visit       Unprioritized   Essential hypertension    BP Readings from Last 3 Encounters:  03/21/22 (!) 163/69  01/31/22 (!) 158/93  01/24/22 (!) 159/73  BP uncontrolled. Will try increasing carvedilol again and see how she tolerates it.  She will let me know if she has any issues. She does not wish to start a second antihypertensive.       Relevant Medications   carvedilol (COREG) 3.125 MG tablet   Bronchitis    New. Suspect developing sinusitis as well. Declined augmentin. Rx sent for azithromycin. Delsym PRN cough. Obtain cxr on the first floor.       Other Visit Diagnoses     Cough, unspecified type    -  Primary   Relevant Orders   DG Chest 2 View       Meds ordered  this encounter  Medications   carvedilol (COREG) 3.125 MG tablet    Sig: Take 2 tablets (6.25 mg total) by mouth 2 (two) times daily with a meal.    Dispense:  60 tablet    Refill:  3    Order Specific Question:   Supervising Provider    Answer:   Penni Homans A [[id number]]   azithromycin (ZITHROMAX) 250 MG tablet    Sig: Take 2 tablets on day 1, then 1 tablet daily on days 2 through 5    Dispense:  6 tablet    Refill:  0    Order Specific Question:   Supervising Provider    Answer:   Penni Homans A [[id number]]    I, Nance Pear, NP, personally preformed the services described in this documentation.  All medical record entries made by the scribe were at my direction and in my presence.  I have reviewed the chart and discharge instructions (if applicable) and  agree that the record reflects my personal performance and is accurate and complete. 03/21/2022   I,Amber Collins,acting as a Education administrator for Nance Pear, NP.,have documented all relevant documentation on the behalf of Nance Pear, NP,as directed by  Nance Pear, NP while in the presence of Nance Pear, NP.  Nance Pear, NP

## 2022-03-21 NOTE — Assessment & Plan Note (Signed)
New. Suspect developing sinusitis as well. Declined augmentin. Rx sent for azithromycin. Delsym PRN cough. Obtain cxr on the first floor.

## 2022-03-21 NOTE — Assessment & Plan Note (Addendum)
BP Readings from Last 3 Encounters:  03/21/22 (!) 163/69  01/31/22 (!) 158/93  01/24/22 (!) 159/73   BP uncontrolled. Will try increasing carvedilol again and see how she tolerates it.  She will let me know if she has any issues. She does not wish to start a second antihypertensive.

## 2022-05-01 ENCOUNTER — Ambulatory Visit (INDEPENDENT_AMBULATORY_CARE_PROVIDER_SITE_OTHER): Payer: Commercial Managed Care - HMO | Admitting: Family

## 2022-05-01 VITALS — BP 152/76 | HR 58 | Temp 98.1°F | Resp 16 | Wt 221.0 lb

## 2022-05-01 DIAGNOSIS — I2510 Atherosclerotic heart disease of native coronary artery without angina pectoris: Secondary | ICD-10-CM | POA: Diagnosis not present

## 2022-05-01 DIAGNOSIS — I10 Essential (primary) hypertension: Secondary | ICD-10-CM

## 2022-05-01 DIAGNOSIS — R739 Hyperglycemia, unspecified: Secondary | ICD-10-CM | POA: Insufficient documentation

## 2022-05-01 DIAGNOSIS — E782 Mixed hyperlipidemia: Secondary | ICD-10-CM | POA: Diagnosis not present

## 2022-05-01 LAB — BASIC METABOLIC PANEL
BUN: 11 mg/dL (ref 6–23)
CO2: 24 mEq/L (ref 19–32)
Calcium: 9.6 mg/dL (ref 8.4–10.5)
Chloride: 106 mEq/L (ref 96–112)
Creatinine, Ser: 0.71 mg/dL (ref 0.40–1.20)
GFR: 91.58 mL/min (ref >60.00)
Glucose, Bld: 95 mg/dL (ref 70–99)
Potassium: 4.3 mEq/L (ref 3.5–5.1)
Sodium: 139 mEq/L (ref 135–145)

## 2022-05-01 LAB — HEMOGLOBIN A1C: Hgb A1c MFr Bld: 6.3 % (ref 4.6–6.5)

## 2022-05-01 MED ORDER — CARVEDILOL 3.125 MG PO TABS: ORAL_TABLET | ORAL | 3 refills | Status: DC

## 2022-05-01 NOTE — Assessment & Plan Note (Signed)
BP Readings from Last 3 Encounters:  05/01/22 (!) 152/76  03/21/22 (!) 163/69  01/31/22 (!) 158/93   Uncontrolled. She is taking 6.25 once daily rather than bid.  Will have her take 6.'25mg'$  in AM and 3.125 mg in PM to see if she can better tolerate this regimen.

## 2022-05-01 NOTE — Assessment & Plan Note (Signed)
Statin Intolerant. Check follow up lipid panel.

## 2022-05-01 NOTE — Assessment & Plan Note (Signed)
Wt Readings from Last 3 Encounters:  05/01/22 221 lb (100.2 kg)  03/21/22 220 lb (99.8 kg)  01/24/22 217 lb (98.4 kg)   Check follow up A1C.

## 2022-05-01 NOTE — Assessment & Plan Note (Signed)
Has upcoming follow up scheduled with cardiology.

## 2022-05-01 NOTE — Progress Notes (Addendum)
Subjective:   By signing my name below, I, Kaitlyn Bowman, attest that this documentation has been prepared under the direction and in the presence of [name], NP. 05/01/2022    Patient ID: Kaitlyn Bowman, female    DOB: 1960-11-07, 61 y.o.   MRN: [id number]  Chief Complaint  Patient presents with   Hypertension    Here for follow up    Hypertension   Patient is in today for follow up.   Blood pressure- During last visit her 6.25 carvedilol was increased from 1 to 2 tablets 2x daily. She reports since then she only takes 1 tablet 2x daily PO due to having difficulty sleeping at night and fatigue during the day. She is willing to try taking 2 tablets every night and 1 tablet every morning. She has an upcomming appointment with her cardiologist for her recent elevated blood pressure. She has a blood pressure machine at home and is willing to monitor her blood pressure for the next couple days on her new blood pressure medication dose.  BP Readings from Last 3 Encounters:  05/01/22 (!) 152/76  03/21/22 (!) 163/69  01/31/22 (!) 158/93   Pulse Readings from Last 3 Encounters:  05/01/22 (!) 58  03/21/22 63  01/31/22 67   Weight- She is inquiring for a healthy weight and food program. She forgot the name of the specific organizations and is planning on mentioning it again when she remembers.   Health Maintenance Due  Topic Date Due   TETANUS/TDAP  02/20/2022    Past Medical History:  Diagnosis Date   Abnormal TSH    Alcohol abuse    Cocaine abuse ([place])    Remote crack cocaine use. UDS negative for cocaine 01/2012   Coronary artery disease     inferior STEMI 01/13/12 s/p PTCA/DES to mid RCA. Initial EF 45% by cath but improved to 55-60% by echo 01/14/12   Dyslipidemia    Trig 201, HDL 36, LDL 197 01/2012   GERD (gastroesophageal reflux disease)    History of MI (myocardial infarction) 01/2012   Hypothyroidism    Myocardial infarction [facility]) 6073   Umbilical hernia      Past Surgical History:  Procedure Laterality Date   ABDOMINAL HYSTERECTOMY  1981   partial   CARDIAC CATHETERIZATION  2010, 2013   Negative. 2013--100% blockage   HERNIA REPAIR  2017   abd hernia    LEFT HEART CATHETERIZATION WITH CORONARY ANGIOGRAM N/A 01/12/2012   Procedure: LEFT HEART CATHETERIZATION WITH CORONARY ANGIOGRAM;  Surgeon: Burnell Blanks, MD;  Location: [facility] CATH LAB;  Service: Cardiovascular;  Laterality: N/A;    Family History  Problem Relation Age of Onset   Heart attack Mother 1       prior pacemaker   Heart disease Mother    Diabetes Mother    Heart failure Father        CHF   Heart disease Father    Alcoholism Father    Stroke Brother     Social History   Socioeconomic History   Marital status: Single    Spouse name: Not on file   Number of children: 1   Years of education: Not on file   Highest education level: GED or equivalent  Occupational History   Occupation: Best boy: [employer]  Tobacco Use   Smoking status: Former    Years: 33.00    Types: Cigarettes    Quit date: 06/13/2008    Years  since quitting: 13.8   Smokeless tobacco: Never   Tobacco comments:    None since 2009   Substance and Sexual Activity   Alcohol use: No    Comment: former   Drug use: No    Types: "Crack" cocaine    Comment: Crack cocaine; none since Jan 2006    Sexual activity: Not on file  Other Topics Concern   Not on file  Social History Narrative   Regular exercise:  Walks 5-7 x weekly   Caffeine use:  1 daily   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   Enjoys reading, sewing, Fill in puzzles, walking   Completed GED.   No pets.               Social Determinants of Health   Financial Resource Strain: Low Risk  (02/21/2021)   Overall Financial Resource Strain (CARDIA)    Difficulty of Paying Living Expenses: Not very hard  Food Insecurity: No Food Insecurity  (02/21/2021)   Hunger Vital Sign    Worried About Running Out of Food in the Last Year: Never true    Ran Out of Food in the Last Year: Never true  Transportation Needs: No Transportation Needs (02/21/2021)   PRAPARE - Hydrologist (Medical): No    Lack of Transportation (Non-Medical): No  Physical Activity: Insufficiently Active (02/21/2021)   Exercise Vital Sign    Days of Exercise per Week: 4 days    Minutes of Exercise per Session: 20 min  Stress: No Stress Concern Present (02/21/2021)   [place]    Feeling of Stress : Not at all  Social Connections: Moderately Isolated (02/21/2021)   Social Connection and Isolation Panel [NHANES]    Frequency of Communication with Friends and Family: More than three times a week    Frequency of Social Gatherings with Friends and Family: Twice a week    Attends Religious Services: More than 4 times per year    Active Member of [employer] or Organizations: No    Attends Music therapist: Not on file    Marital Status: Never married  Intimate Partner Violence: Not on file    Outpatient Medications Prior to Visit  Medication Sig Dispense Refill   aspirin EC 81 MG tablet Take 1 tablet (81 mg total) by mouth daily. 90 tablet 3   nitroGLYCERIN (NITROSTAT) 0.4 MG SL tablet Place 1 tablet (0.4 mg total) under the tongue every 5 (five) minutes x 3 doses as needed for chest pain. 25 tablet 6   sodium chloride (OCEAN) 0.65 % SOLN nasal spray Place 1 spray into both nostrils as needed for congestion. 15 mL 0   carvedilol (COREG) 3.125 MG tablet Take 2 tablets (6.25 mg total) by mouth 2 (two) times daily with a meal. 60 tablet 3   fenofibrate (TRICOR) 145 MG tablet Take 1 tablet (145 mg total) by mouth daily. (Patient not taking: Reported on 03/21/2022) 30 tablet 5   fluticasone (FLONASE) 50 MCG/ACT nasal spray Place 2 sprays into both nostrils daily. 16 g 5    No facility-administered medications prior to visit.    Allergies  Allergen Reactions   Ciprofloxacin     REACTION: nause-sweating-chest tightness   Atorvastatin     Myalgia    Crestor [Rosuvastatin Calcium] Nausea Only   Nexlizet [  Bempedoic Acid-Ezetimibe] Other (See Comments)    myalgia   Livalo [Pitavastatin]     myalgia   Praluent [Alirocumab]     Muscle aches    ROS See HPI    Objective:    Physical Exam Constitutional:      General: She is not in acute distress.    Appearance: Normal appearance. She is not ill-appearing.  HENT:     Head: Normocephalic and atraumatic.     Right Ear: External ear normal.     Left Ear: External ear normal.  Eyes:     Extraocular Movements: Extraocular movements intact.     Pupils: Pupils are equal, round, and reactive to light.  Cardiovascular:     Rate and Rhythm: Normal rate and regular rhythm.     Heart sounds: Normal heart sounds. No murmur heard.    No gallop.  Pulmonary:     Effort: Pulmonary effort is normal. No respiratory distress.     Breath sounds: Normal breath sounds. No wheezing or rales.  Skin:    General: Skin is warm and dry.  Neurological:     Mental Status: She is alert and oriented to person, place, and time.  Psychiatric:        Judgment: Judgment normal.     BP (!) 152/76 (BP Location: Left Arm, Patient Position: Sitting, Cuff Size: Large)   Pulse (!) 58   Temp 98.1 F (36.7 C) (Oral)   Resp 16   Wt 221 lb (100.2 kg)   SpO2 100%   BMI 34.61 kg/m  Wt Readings from Last 3 Encounters:  05/01/22 221 lb (100.2 kg)  03/21/22 220 lb (99.8 kg)  01/24/22 217 lb (98.4 kg)       Assessment & Plan:   Problem List Items Addressed This Visit       Unprioritized   Hyperlipidemia    Statin Intolerant. Check follow up lipid panel.       Relevant Medications   carvedilol (COREG) 3.125 MG tablet   Other Relevant Orders   Lipid panel   Hyperglycemia    Wt Readings from Last 3 Encounters:   05/01/22 221 lb (100.2 kg)  03/21/22 220 lb (99.8 kg)  01/24/22 217 lb (98.4 kg)  Check follow up A1C.       Relevant Orders   Hemoglobin A1c   Essential hypertension - Primary    BP Readings from Last 3 Encounters:  05/01/22 (!) 152/76  03/21/22 (!) 163/69  01/31/22 (!) 158/93  Uncontrolled. She is taking 6.25 once daily rather than bid.  Will have her take 6.'25mg'$  in AM and 3.125 mg in PM to see if she can better tolerate this regimen.       Relevant Medications   carvedilol (COREG) 3.125 MG tablet   Other Relevant Orders   Basic metabolic panel   CAD (coronary artery disease)    Has upcoming follow up scheduled with cardiology.       Relevant Medications   carvedilol (COREG) 3.125 MG tablet     Meds ordered this encounter  Medications   carvedilol (COREG) 3.125 MG tablet    Sig: Take 2 tabs by mouth in the AM and 1 tab by mouth in the PM    Dispense:  90 tablet    Refill:  3    Order Specific Question:   Supervising Provider    Answer:   Mosie Lukes [[id number]]    I, Nance Pear, NP, personally preformed the services described  in this documentation.  All medical record entries made by the scribe were at my direction and in my presence.  I have reviewed the chart and discharge instructions (if applicable) and agree that the record reflects my personal performance and is accurate and complete. 05/01/2022   I,Kaitlyn Bowman,acting as a Education administrator for Nance Pear, NP.,have documented all relevant documentation on the behalf of Nance Pear, NP,as directed by  Nance Pear, NP while in the presence of Nance Pear, NP.   Nance Pear, NP

## 2022-05-11 ENCOUNTER — Encounter: Payer: Self-pay | Admitting: Family

## 2022-05-11 DIAGNOSIS — R7303 Prediabetes: Secondary | ICD-10-CM | POA: Insufficient documentation

## 2022-07-30 NOTE — Progress Notes (Signed)
No chief complaint on file.   History of Present Illness: 61 yo female with history of CAD and hyperlipidemia here today for cardiac follow up. She had an inferior STEMI in April 2013 and was found to have an occluded mid RCA which was treated with a drug eluting stent. Echo May 2016 with normal LV function. Stress test in 2016 with no ischemia.  She hat not tolerated statins or Praluent.   She is here today for follow up. The patient denies any chest pain, dyspnea, palpitations, lower extremity edema, orthopnea, PND, dizziness, near syncope or syncope.    Primary Care Provider: [name], NP   Past Medical History:  Diagnosis Date   Abnormal TSH    Alcohol abuse    Borderline diabetes    Cocaine abuse ([place])    Remote crack cocaine use. UDS negative for cocaine 01/2012   Coronary artery disease     inferior STEMI 01/13/12 s/p PTCA/DES to mid RCA. Initial EF 45% by cath but improved to 55-60% by echo 01/14/12   Dyslipidemia    Trig 201, HDL 36, LDL 197 01/2012   GERD (gastroesophageal reflux disease)    History of MI (myocardial infarction) 01/2012   Hypothyroidism    Myocardial infarction [facility]) 6301   Umbilical hernia     Past Surgical History:  Procedure Laterality Date   ABDOMINAL HYSTERECTOMY  1981   partial   CARDIAC CATHETERIZATION  2010, 2013   Negative. 2013--100% blockage   HERNIA REPAIR  2017   abd hernia    LEFT HEART CATHETERIZATION WITH CORONARY ANGIOGRAM N/A 01/12/2012   Procedure: LEFT HEART CATHETERIZATION WITH CORONARY ANGIOGRAM;  Surgeon: Burnell Blanks, MD;  Location: [facility] CATH LAB;  Service: Cardiovascular;  Laterality: N/A;    Current Outpatient Medications  Medication Sig Dispense Refill   aspirin EC 81 MG tablet Take 1 tablet (81 mg total) by mouth daily. 90 tablet 3   carvedilol (COREG) 3.125 MG tablet Take 2 tabs by mouth in the AM and 1 tab by mouth in the PM 90 tablet 3   fenofibrate (TRICOR) 145 MG tablet Take 1 tablet (145 mg total)  by mouth daily. (Patient not taking: Reported on 03/21/2022) 30 tablet 5   nitroGLYCERIN (NITROSTAT) 0.4 MG SL tablet Place 1 tablet (0.4 mg total) under the tongue every 5 (five) minutes x 3 doses as needed for chest pain. 25 tablet 6   sodium chloride (OCEAN) 0.65 % SOLN nasal spray Place 1 spray into both nostrils as needed for congestion. 15 mL 0   No current facility-administered medications for this visit.    Allergies  Allergen Reactions   Ciprofloxacin     REACTION: nause-sweating-chest tightness   Atorvastatin     Myalgia    Crestor [Rosuvastatin Calcium] Nausea Only   Nexlizet [Bempedoic Acid-Ezetimibe] Other (See Comments)    myalgia   Livalo [Pitavastatin]     myalgia   Praluent [Alirocumab]     Muscle aches    Social History   Socioeconomic History   Marital status: Single    Spouse name: Not on file   Number of children: 1   Years of education: Not on file   Highest education level: GED or equivalent  Occupational History   Occupation: Best boy: [employer]  Tobacco Use   Smoking status: Former    Years: 33.00    Types: Cigarettes    Quit date: 06/13/2008    Years since quitting: 45.1  Smokeless tobacco: Never   Tobacco comments:    None since 2009   Substance and Sexual Activity   Alcohol use: No    Comment: former   Drug use: No    Types: "Crack" cocaine    Comment: Crack cocaine; none since Jan 2006    Sexual activity: Not on file  Other Topics Concern   Not on file  Social History Narrative   Regular exercise:  Walks 5-7 x weekly   Caffeine use:  1 daily   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   Enjoys reading, sewing, Fill in puzzles, walking   Completed GED.   No pets.               Social Determinants of Health   Financial Resource Strain: Low Risk  (02/21/2021)   Overall Financial Resource Strain (CARDIA)    Difficulty of Paying Living Expenses:  Not very hard  Food Insecurity: No Food Insecurity (02/21/2021)   Hunger Vital Sign    Worried About Running Out of Food in the Last Year: Never true    Ran Out of Food in the Last Year: Never true  Transportation Needs: No Transportation Needs (02/21/2021)   PRAPARE - Hydrologist (Medical): No    Lack of Transportation (Non-Medical): No  Physical Activity: Insufficiently Active (02/21/2021)   Exercise Vital Sign    Days of Exercise per Week: 4 days    Minutes of Exercise per Session: 20 min  Stress: No Stress Concern Present (02/21/2021)   [place]    Feeling of Stress : Not at all  Social Connections: Moderately Isolated (02/21/2021)   Social Connection and Isolation Panel [NHANES]    Frequency of Communication with Friends and Family: More than three times a week    Frequency of Social Gatherings with Friends and Family: Twice a week    Attends Religious Services: More than 4 times per year    Active Member of [employer] or Organizations: No    Attends Music therapist: Not on file    Marital Status: Never married  Human resources officer Violence: Not on file    Family History  Problem Relation Age of Onset   Heart attack Mother 3       prior pacemaker   Heart disease Mother    Diabetes Mother    Heart failure Father        CHF   Heart disease Father    Alcoholism Father    Stroke Brother     Review of Systems:  As stated in the HPI and otherwise negative.   There were no vitals taken for this visit.  Physical Examination:  General: Well developed, well nourished, NAD  HEENT: OP clear, mucus membranes moist  SKIN: warm, dry. No rashes. Neuro: No focal deficits  Musculoskeletal: Muscle strength 5/5 all ext  Psychiatric: Mood and affect normal  Neck: No JVD, no carotid bruits, no thyromegaly, no lymphadenopathy.  Lungs:Clear bilaterally, no wheezes, rhonci,  crackles Cardiovascular: Regular rate and rhythm. No murmurs, gallops or rubs. Abdomen:Soft. Bowel sounds present. Non-tender.  Extremities: No lower extremity edema. Pulses are 2 + in the bilateral DP/PT.  Echo 02/15/15: - Left ventricle: The cavity size was normal. Systolic function was   normal. The estimated ejection fraction was in the range of 55%  to 60%. Wall motion was normal; there were no regional wall   motion abnormalities. Left ventricular diastolic function   parameters were normal. - Aortic valve: There was trivial regurgitation. - Mitral valve: There was mild to moderate regurgitation directed   centrally. - Left atrium: The atrium was mildly dilated.   EKG:  EKG is *** ordered today. The ekg ordered today demonstrates   Recent Labs: 05/01/2022: BUN 11; Creatinine, Ser 0.71; Potassium 4.3; Sodium 139   Lipid Panel    Component Value Date/Time   CHOL 225 (H) 07/26/2021 1359   CHOL 304 (H) 08/30/2020 1545   TRIG 246.0 (H) 07/26/2021 1359   HDL 44.30 07/26/2021 1359   HDL 40 08/30/2020 1545   CHOLHDL 5 07/26/2021 1359   VLDL 49.2 (H) 07/26/2021 1359   LDLCALC 88 01/11/2021 1133   LDLCALC 220 (H) 08/30/2020 1545   LDLCALC 187 (H) 06/01/2020 1500   LDLDIRECT 130.0 07/26/2021 1359     Wt Readings from Last 3 Encounters:  05/01/22 221 lb (100.2 kg)  03/21/22 220 lb (99.8 kg)  01/24/22 217 lb (98.4 kg)    Cardiac cath 01/12/12: Left main: No obstructive disease.  Left Anterior Descending Artery: Large vessel that courses to the apex. Mild luminal irregularities in the mid vessel. Moderate sized diagonal branch with no disease.  Circumflex Artery: Large caliber vessel with serial 20% lesions in the mid vessel. OM1 is moderate sized with no disease. The second and third marginals are small. The fourth OM branch is moderate sized and has a 30% proximal stenosis.  Right Coronary Artery: Dominant vessel with 100% mid occlusion.  Left Ventricular Angiogram: LVEF 45%.  Inferior wall hypokinesis.    Assessment and Plan:   1. CAD without angina: No chest pain. She does not tolerate statins due to muscle aches or beta blockers due to fatigue. She did not tolerate Praluent. Continue ASA and beta blocker.   2. HLD: LDL is not at goal. Limited options for medical therapy. She has not tolerated statins or Praluent. Continue Tricor.  Labs/ tests ordered today include:  No orders of the defined types were placed in this encounter.   Disposition:   FU with me in 12 months  Signed, Lauree Chandler, MD 07/30/2022 2:01 PM    [facility] [place], [place], [place]  [address] Phone: [phone]; Fax: [phone]

## 2022-07-31 ENCOUNTER — Encounter: Payer: Self-pay | Admitting: Cardiovascular Disease

## 2022-07-31 ENCOUNTER — Ambulatory Visit: Payer: Commercial Managed Care - HMO | Attending: Cardiovascular Disease | Admitting: Cardiovascular Disease

## 2022-07-31 VITALS — BP 138/88 | HR 63 | Ht 67.0 in | Wt 223.4 lb

## 2022-07-31 DIAGNOSIS — I2510 Atherosclerotic heart disease of native coronary artery without angina pectoris: Secondary | ICD-10-CM

## 2022-07-31 DIAGNOSIS — I10 Essential (primary) hypertension: Secondary | ICD-10-CM | POA: Diagnosis not present

## 2022-07-31 DIAGNOSIS — E785 Hyperlipidemia, unspecified: Secondary | ICD-10-CM

## 2022-07-31 MED ORDER — AMLODIPINE BESYLATE 5 MG PO TABS: 5.0000 mg | ORAL_TABLET | Freq: Every day | ORAL | 3 refills | Status: DC

## 2022-07-31 NOTE — Patient Instructions (Signed)
Medication Instructions:  Your physician has recommended you make the following change in your medication:  1.) stop carvedilol 2.) start amlodipine (Norvasc) 5 mg - take one tablet daily  *If you need a refill on your cardiac medications before your next appointment, please call your pharmacy*   Lab Work: none   Testing/Procedures: Your physician has requested that you have an echocardiogram. Echocardiography is a painless test that uses sound waves to create images of your heart. It provides your doctor with information about the size and shape of your heart and how well your heart's chambers and valves are working. This procedure takes approximately one hour. There are no restrictions for this procedure. Please do NOT wear cologne, perfume, aftershave, or lotions (deodorant is allowed). Please arrive 15 minutes prior to your appointment time.   Follow-Up: At [facility], you and your health needs are our priority.  As part of our continuing mission to provide you with exceptional heart care, we have created designated Provider Care Teams.  These Care Teams include your primary Cardiologist (physician) and Advanced Practice Providers (APPs -  Physician Assistants and Nurse Practitioners) who all work together to provide you with the care you need, when you need it.   Your next appointment:   12 month(s)  The format for your next appointment:   In Person  Provider:   Lauree Chandler, MD     Important Information About Sugar

## 2022-08-01 ENCOUNTER — Ambulatory Visit (INDEPENDENT_AMBULATORY_CARE_PROVIDER_SITE_OTHER): Payer: Commercial Managed Care - HMO | Admitting: Family

## 2022-08-01 DIAGNOSIS — I10 Essential (primary) hypertension: Secondary | ICD-10-CM | POA: Diagnosis not present

## 2022-08-01 DIAGNOSIS — R7303 Prediabetes: Secondary | ICD-10-CM

## 2022-08-01 NOTE — Progress Notes (Signed)
Subjective:   By signing my name below, I, [name], attest that this documentation has been prepared under the direction and in the presence of Karie Chimera, NP 08/01/2022   Patient ID: Hilary Hertz, female    DOB: 14-Jun-1961, 61 y.o.   MRN: [id number]  Chief Complaint  Patient presents with   Hypertension    Here for follow up    HPI Patient is in today for an office visit  Blood Pressure: Her blood pressure is elevated as of today's visit. Dr. Angelena Form has discontinued her Carvedilol medication and changed her to 5 Mg of Amlodipine. She has not taken her Amlodipine medication yet.  BP Readings from Last 3 Encounters:  08/01/22 (!) 159/75  07/31/22 138/88  05/01/22 (!) 152/76   Pulse Readings from Last 3 Encounters:  08/01/22 (!) 59  07/31/22 63  05/01/22 (!) 58   Cholesterol: She is not receiving cholesterol injections at the moment. She does not tolerate statin medications well Lab Results  Component Value Date   CHOL 225 (H) 07/26/2021   HDL 44.30 07/26/2021   LDLCALC 88 01/11/2021   LDLDIRECT 130.0 07/26/2021   TRIG 246.0 (H) 07/26/2021   CHOLHDL 5 07/26/2021   Health Maintenance Due  Topic Date Due   TETANUS/TDAP  02/20/2022   INFLUENZA VACCINE  Never done    Past Medical History:  Diagnosis Date   Abnormal TSH    Alcohol abuse    Borderline diabetes    Cocaine abuse ([place])    Remote crack cocaine use. UDS negative for cocaine 01/2012   Coronary artery disease     inferior STEMI 01/13/12 s/p PTCA/DES to mid RCA. Initial EF 45% by cath but improved to 55-60% by echo 01/14/12   Dyslipidemia    Trig 201, HDL 36, LDL 197 01/2012   GERD (gastroesophageal reflux disease)    History of MI (myocardial infarction) 01/2012   Hypothyroidism    Myocardial infarction [facility]) 8299   Umbilical hernia     Past Surgical History:  Procedure Laterality Date   ABDOMINAL HYSTERECTOMY  1981   partial   CARDIAC CATHETERIZATION  2010, 2013   Negative.  2013--100% blockage   HERNIA REPAIR  2017   abd hernia    LEFT HEART CATHETERIZATION WITH CORONARY ANGIOGRAM N/A 01/12/2012   Procedure: LEFT HEART CATHETERIZATION WITH CORONARY ANGIOGRAM;  Surgeon: Burnell Blanks, MD;  Location: [facility] CATH LAB;  Service: Cardiovascular;  Laterality: N/A;    Family History  Problem Relation Age of Onset   Heart attack Mother 41       prior pacemaker   Heart disease Mother    Diabetes Mother    Heart failure Father        CHF   Heart disease Father    Alcoholism Father    Stroke Brother     Social History   Socioeconomic History   Marital status: Single    Spouse name: Not on file   Number of children: 1   Years of education: Not on file   Highest education level: GED or equivalent  Occupational History   Occupation: Best boy: [employer]  Tobacco Use   Smoking status: Former    Years: 33.00    Types: Cigarettes    Quit date: 06/13/2008    Years since quitting: 14.1   Smokeless tobacco: Never   Tobacco comments:    None since 2009   Substance and Sexual Activity   Alcohol use:  No    Comment: former   Drug use: No    Types: "Crack" cocaine    Comment: Crack cocaine; none since Jan 2006    Sexual activity: Not on file  Other Topics Concern   Not on file  Social History Narrative   Regular exercise:  Walks 5-7 x weekly   Caffeine use:  1 daily   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   Enjoys reading, sewing, Fill in puzzles, walking   Completed GED.   No pets.               Social Determinants of Health   Financial Resource Strain: Low Risk  (02/21/2021)   Overall Financial Resource Strain (CARDIA)    Difficulty of Paying Living Expenses: Not very hard  Food Insecurity: No Food Insecurity (02/21/2021)   Hunger Vital Sign    Worried About Running Out of Food in the Last Year: Never true    Ran Out of Food in the Last Year: Never true   Transportation Needs: No Transportation Needs (02/21/2021)   PRAPARE - Hydrologist (Medical): No    Lack of Transportation (Non-Medical): No  Physical Activity: Insufficiently Active (02/21/2021)   Exercise Vital Sign    Days of Exercise per Week: 4 days    Minutes of Exercise per Session: 20 min  Stress: No Stress Concern Present (02/21/2021)   [place]    Feeling of Stress : Not at all  Social Connections: Moderately Isolated (02/21/2021)   Social Connection and Isolation Panel [NHANES]    Frequency of Communication with Friends and Family: More than three times a week    Frequency of Social Gatherings with Friends and Family: Twice a week    Attends Religious Services: More than 4 times per year    Active Member of [employer] or Organizations: No    Attends Music therapist: Not on file    Marital Status: Never married  Intimate Partner Violence: Not on file    Outpatient Medications Prior to Visit  Medication Sig Dispense Refill   amLODipine (NORVASC) 5 MG tablet Take 1 tablet (5 mg total) by mouth daily. 90 tablet 3   aspirin EC 81 MG tablet Take 1 tablet (81 mg total) by mouth daily. 90 tablet 3   nitroGLYCERIN (NITROSTAT) 0.4 MG SL tablet Place 1 tablet (0.4 mg total) under the tongue every 5 (five) minutes x 3 doses as needed for chest pain. 25 tablet 6   sodium chloride (OCEAN) 0.65 % SOLN nasal spray Place 1 spray into both nostrils as needed for congestion. 15 mL 0   No facility-administered medications prior to visit.    Allergies  Allergen Reactions   Ciprofloxacin     REACTION: nause-sweating-chest tightness   Atorvastatin     Myalgia    Crestor [Rosuvastatin Calcium] Nausea Only   Nexlizet [Bempedoic Acid-Ezetimibe] Other (See Comments)    myalgia   Livalo [Pitavastatin]     myalgia   Praluent [Alirocumab]     Muscle aches    ROS    See  HPI Objective:    Physical Exam Constitutional:      General: She is not in acute distress.    Appearance: Normal appearance. She is not ill-appearing.  HENT:     Head: Normocephalic and atraumatic.  Right Ear: External ear normal.     Left Ear: External ear normal.  Eyes:     Extraocular Movements: Extraocular movements intact.     Pupils: Pupils are equal, round, and reactive to light.  Cardiovascular:     Rate and Rhythm: Normal rate and regular rhythm.     Heart sounds: Normal heart sounds. No murmur heard.    No gallop.  Pulmonary:     Effort: Pulmonary effort is normal. No respiratory distress.     Breath sounds: Normal breath sounds. No wheezing or rales.  Skin:    General: Skin is warm and dry.  Neurological:     Mental Status: She is alert and oriented to person, place, and time.  Psychiatric:        Mood and Affect: Mood normal.        Behavior: Behavior normal.        Judgment: Judgment normal.     BP (!) 159/75 (BP Location: Right Arm, Patient Position: Sitting, Cuff Size: Large)   Pulse (!) 59   Temp 97.7 F (36.5 C) (Oral)   Resp 16   Wt 222 lb (100.7 kg)   SpO2 100%   BMI 34.77 kg/m  Wt Readings from Last 3 Encounters:  08/01/22 222 lb (100.7 kg)  07/31/22 223 lb 6.4 oz (101.3 kg)  05/01/22 221 lb (100.2 kg)       Assessment & Plan:   Problem List Items Addressed This Visit       Unprioritized   Essential hypertension    BP elevated. Will start amlodipine tonight. Follow up in 2 wekes for bp recheck.      Relevant Orders   Hemoglobin A1c   Comp Met (CMET)   Borderline diabetes    Lab Results  Component Value Date   HGBA1C 6.3 05/01/2022  Will recheck a1c.       Relevant Orders   Hemoglobin A1c   Comp Met (CMET)   No orders of the defined types were placed in this encounter.   I, Nance Pear, NP, personally preformed the services described in this documentation.  All medical record entries made by the scribe were at  my direction and in my presence.  I have reviewed the chart and discharge instructions (if applicable) and agree that the record reflects my personal performance and is accurate and complete. 08/01/2022   I,Amber Collins,acting as a scribe for Nance Pear, NP.,have documented all relevant documentation on the behalf of Nance Pear, NP,as directed by  Nance Pear, NP while in the presence of Nance Pear, NP.    Nance Pear, NP

## 2022-08-01 NOTE — Assessment & Plan Note (Signed)
BP elevated. Will start amlodipine tonight. Follow up in 2 wekes for bp recheck.

## 2022-08-01 NOTE — Assessment & Plan Note (Signed)
Lab Results  Component Value Date   HGBA1C 6.3 05/01/2022   Will recheck a1c.

## 2022-08-01 NOTE — Assessment & Plan Note (Signed)
>>  ASSESSMENT AND PLAN FOR BORDERLINE DIABETES WRITTEN ON 08/01/2022  2:26 PM BY [name], NP  Lab Results  Component Value Date   HGBA1C 6.3 05/01/2022   Will recheck a1c.

## 2022-08-02 LAB — COMPREHENSIVE METABOLIC PANEL
ALT: 18 U/L (ref 0–35)
AST: 20 U/L (ref 0–37)
Albumin: 4.2 g/dL (ref 3.5–5.2)
Alkaline Phosphatase: 67 U/L (ref 39–117)
BUN: 10 mg/dL (ref 6–23)
CO2: 24 mEq/L (ref 19–32)
Calcium: 9.6 mg/dL (ref 8.4–10.5)
Chloride: 105 mEq/L (ref 96–112)
Creatinine, Ser: 0.7 mg/dL (ref 0.40–1.20)
GFR: 92.99 mL/min (ref >60.00)
Glucose, Bld: 74 mg/dL (ref 70–99)
Potassium: 4.2 mEq/L (ref 3.5–5.1)
Sodium: 138 mEq/L (ref 135–145)
Total Bilirubin: 0.3 mg/dL (ref 0.2–1.2)
Total Protein: 7.4 g/dL (ref 6.0–8.3)

## 2022-08-02 LAB — HEMOGLOBIN A1C: Hgb A1c MFr Bld: 6.3 % (ref 4.6–6.5)

## 2022-08-09 ENCOUNTER — Ambulatory Visit (HOSPITAL_BASED_OUTPATIENT_CLINIC_OR_DEPARTMENT_OTHER)
Admission: RE | Admit: 2022-08-09 | Discharge: 2022-08-09 | Disposition: A | Discharge status: Home / Self Care | Payer: Commercial Managed Care - HMO | Source: Ambulatory Visit | Attending: Cardiovascular Disease | Admitting: Cardiovascular Disease

## 2022-08-09 DIAGNOSIS — R0609 Other forms of dyspnea: Secondary | ICD-10-CM

## 2022-08-09 DIAGNOSIS — I10 Essential (primary) hypertension: Secondary | ICD-10-CM | POA: Insufficient documentation

## 2022-08-09 DIAGNOSIS — E785 Hyperlipidemia, unspecified: Secondary | ICD-10-CM | POA: Insufficient documentation

## 2022-08-09 DIAGNOSIS — I2510 Atherosclerotic heart disease of native coronary artery without angina pectoris: Secondary | ICD-10-CM | POA: Diagnosis not present

## 2022-08-09 LAB — ECHOCARDIOGRAM COMPLETE
AR max vel: 1.32 cm2
AV Area VTI: 1.24 cm2
AV Area mean vel: 1.19 cm2
AV Mean grad: 5 mmHg
AV Peak grad: 8.6 mmHg
Ao pk vel: 1.47 m/s
Area-P 1/2: 4.8 cm2
Calc EF: 58.7 %
S' Lateral: 3.4 cm
Single Plane A2C EF: 55.3 %
Single Plane A4C EF: 62.1 %

## 2022-08-15 ENCOUNTER — Ambulatory Visit (INDEPENDENT_AMBULATORY_CARE_PROVIDER_SITE_OTHER): Payer: Commercial Managed Care - HMO | Admitting: Family

## 2022-08-15 VITALS — BP 134/68 | HR 66 | Temp 97.8°F | Resp 16 | Wt 221.0 lb

## 2022-08-15 DIAGNOSIS — R7303 Prediabetes: Secondary | ICD-10-CM | POA: Diagnosis not present

## 2022-08-15 DIAGNOSIS — I10 Essential (primary) hypertension: Secondary | ICD-10-CM | POA: Diagnosis not present

## 2022-08-15 NOTE — Assessment & Plan Note (Signed)
>>  ASSESSMENT AND PLAN FOR BORDERLINE DIABETES WRITTEN ON 08/15/2022  1:44 PM BY O'SULLIVAN, Hadyn Blanck, NP  A1C is in the borderline range. Reinforced diabetic diet.

## 2022-08-15 NOTE — Progress Notes (Signed)
Subjective:   By signing my name below, I, [name], attest that this documentation has been prepared under the direction and in the presence of Kaitlyn Village, NP 08/15/2022    Patient ID: Kaitlyn Bowman, female    DOB: 31-Mar-1961, 61 y.o.   MRN: [id number]  Chief Complaint  Patient presents with   Hypertension    Here for follow up    HPI Patient is in today for an office visit  Blood Pressure: Her blood pressure is improving. She is currently taking 5 mg of Amlodipine. She states that she is struggling to follow a consistent medication routine. Her blood pressure after recheck is 134/68. BP Readings from Last 3 Encounters:  08/15/22 134/68  08/01/22 (!) 159/75  07/31/22 138/88   Pulse Readings from Last 3 Encounters:  08/15/22 66  08/01/22 (!) 59  07/31/22 63   A1C: Her A1C level is consistent. She states that she is watching her sugar intake.  Lab Results  Component Value Date   HGBA1C 6.3 08/01/2022   Health Maintenance Due  Topic Date Due   TETANUS/TDAP  02/20/2022    Past Medical History:  Diagnosis Date   Abnormal TSH    Alcohol abuse    Borderline diabetes    Cocaine abuse ([place])    Remote crack cocaine use. UDS negative for cocaine 01/2012   Coronary artery disease     inferior STEMI 01/13/12 s/p PTCA/DES to mid RCA. Initial EF 45% by cath but improved to 55-60% by echo 01/14/12   Dyslipidemia    Trig 201, HDL 36, LDL 197 01/2012   GERD (gastroesophageal reflux disease)    History of MI (myocardial infarction) 01/2012   Hypothyroidism    Myocardial infarction [facility]) 7829   Umbilical hernia     Past Surgical History:  Procedure Laterality Date   ABDOMINAL HYSTERECTOMY  1981   partial   CARDIAC CATHETERIZATION  2010, 2013   Negative. 2013--100% blockage   HERNIA REPAIR  2017   abd hernia    LEFT HEART CATHETERIZATION WITH CORONARY ANGIOGRAM N/A 01/12/2012   Procedure: LEFT HEART CATHETERIZATION WITH CORONARY ANGIOGRAM;  Surgeon: Burnell Blanks, MD;  Location: [facility] CATH LAB;  Service: Cardiovascular;  Laterality: N/A;    Family History  Problem Relation Age of Onset   Heart attack Mother 80       prior pacemaker   Heart disease Mother    Diabetes Mother    Heart failure Father        CHF   Heart disease Father    Alcoholism Father    Stroke Brother     Social History   Socioeconomic History   Marital status: Single    Spouse name: Not on file   Number of children: 1   Years of education: Not on file   Highest education level: GED or equivalent  Occupational History   Occupation: Best boy: [employer]  Tobacco Use   Smoking status: Former    Years: 33.00    Types: Cigarettes    Quit date: 06/13/2008    Years since quitting: 14.1   Smokeless tobacco: Never   Tobacco comments:    None since 2009   Substance and Sexual Activity   Alcohol use: No    Comment: former   Drug use: No    Types: "Crack" cocaine    Comment: Crack cocaine; none since Jan 2006    Sexual activity: Not on file  Other  Topics Concern   Not on file  Social History Narrative   Regular exercise:  Walks 5-7 x weekly   Caffeine use:  1 daily   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   Enjoys reading, sewing, Fill in puzzles, walking   Completed GED.   No pets.               Social Determinants of Health   Financial Resource Strain: Low Risk  (02/21/2021)   Overall Financial Resource Strain (CARDIA)    Difficulty of Paying Living Expenses: Not very hard  Food Insecurity: No Food Insecurity (02/21/2021)   Hunger Vital Sign    Worried About Running Out of Food in the Last Year: Never true    Ran Out of Food in the Last Year: Never true  Transportation Needs: No Transportation Needs (02/21/2021)   PRAPARE - Hydrologist (Medical): No    Lack of Transportation (Non-Medical): No  Physical Activity: Insufficiently Active  (02/21/2021)   Exercise Vital Sign    Days of Exercise per Week: 4 days    Minutes of Exercise per Session: 20 min  Stress: No Stress Concern Present (02/21/2021)   [place]    Feeling of Stress : Not at all  Social Connections: Moderately Isolated (02/21/2021)   Social Connection and Isolation Panel [NHANES]    Frequency of Communication with Friends and Family: More than three times a week    Frequency of Social Gatherings with Friends and Family: Twice a week    Attends Religious Services: More than 4 times per year    Active Member of [employer] or Organizations: No    Attends Music therapist: Not on file    Marital Status: Never married  Intimate Partner Violence: Not on file    Outpatient Medications Prior to Visit  Medication Sig Dispense Refill   amLODipine (NORVASC) 5 MG tablet Take 1 tablet (5 mg total) by mouth daily. 90 tablet 3   aspirin EC 81 MG tablet Take 1 tablet (81 mg total) by mouth daily. 90 tablet 3   nitroGLYCERIN (NITROSTAT) 0.4 MG SL tablet Place 1 tablet (0.4 mg total) under the tongue every 5 (five) minutes x 3 doses as needed for chest pain. 25 tablet 6   sodium chloride (OCEAN) 0.65 % SOLN nasal spray Place 1 spray into both nostrils as needed for congestion. 15 mL 0   No facility-administered medications prior to visit.    Allergies  Allergen Reactions   Ciprofloxacin     REACTION: nause-sweating-chest tightness   Atorvastatin     Myalgia    Crestor [Rosuvastatin Calcium] Nausea Only   Nexlizet [Bempedoic Acid-Ezetimibe] Other (See Comments)    myalgia   Livalo [Pitavastatin]     myalgia   Praluent [Alirocumab]     Muscle aches    Review of Systems  Respiratory:  Negative for shortness of breath.        Objective:    Physical Exam Constitutional:      General: She is not in acute distress.    Appearance: Normal appearance. She is not ill-appearing.  HENT:      Head: Normocephalic and atraumatic.     Right Ear: External ear normal.     Left Ear: External ear normal.  Eyes:     Extraocular Movements:  Extraocular movements intact.     Pupils: Pupils are equal, round, and reactive to light.  Cardiovascular:     Rate and Rhythm: Normal rate and regular rhythm.     Heart sounds: Normal heart sounds. No murmur heard.    No gallop.  Pulmonary:     Effort: Pulmonary effort is normal. No respiratory distress.     Breath sounds: Normal breath sounds. No wheezing or rales.  Skin:    General: Skin is warm and dry.  Neurological:     Mental Status: She is alert and oriented to person, place, and time.  Psychiatric:        Mood and Affect: Mood normal.        Behavior: Behavior normal.        Judgment: Judgment normal.     BP 134/68   Pulse 66   Temp 97.8 F (36.6 C) (Oral)   Resp 16   Wt 221 lb (100.2 kg)   SpO2 98%   BMI 34.61 kg/m  Wt Readings from Last 3 Encounters:  08/15/22 221 lb (100.2 kg)  08/01/22 222 lb (100.7 kg)  07/31/22 223 lb 6.4 oz (101.3 kg)       Assessment & Plan:   Problem List Items Addressed This Visit       Unprioritized   Essential hypertension - Primary    BP Readings from Last 3 Encounters:  08/15/22 134/68  08/01/22 (!) 159/75  07/31/22 138/88  BP now at goal.  Continue amlodipine '5mg'$ .       Borderline diabetes    A1C is in the borderline range. Reinforced diabetic diet.      No orders of the defined types were placed in this encounter.   I, Nance Pear, NP, personally preformed the services described in this documentation.  All medical record entries made by the scribe were at my direction and in my presence.  I have reviewed the chart and discharge instructions (if applicable) and agree that the record reflects my personal performance and is accurate and complete. 08/15/2022   I,Amber Collins,acting as a scribe for Nance Pear, NP.,have documented all relevant documentation  on the behalf of Nance Pear, NP,as directed by  Nance Pear, NP while in the presence of Nance Pear, NP.    Nance Pear, NP

## 2022-08-15 NOTE — Assessment & Plan Note (Signed)
A1C is in the borderline range. Reinforced diabetic diet.

## 2022-08-15 NOTE — Assessment & Plan Note (Signed)
BP Readings from Last 3 Encounters:  08/15/22 134/68  08/01/22 (!) 159/75  07/31/22 138/88   BP now at goal.  Continue amlodipine '5mg'$ .

## 2022-08-22 ENCOUNTER — Telehealth: Payer: Self-pay | Admitting: Cardiovascular Disease

## 2022-08-22 NOTE — Telephone Encounter (Signed)
Patient would like specifically RN Fraser Din to return her call.

## 2022-08-22 NOTE — Telephone Encounter (Signed)
I spoke with the patient.  She wanted to further discuss the echo results and aortic valve regurgitation.  I have addressed her questions and concerns and adv to call back if any further needs.

## 2022-10-03 ENCOUNTER — Telehealth (INDEPENDENT_AMBULATORY_CARE_PROVIDER_SITE_OTHER): Payer: Commercial Managed Care - HMO | Admitting: Family Medicine

## 2022-10-03 ENCOUNTER — Encounter: Payer: Self-pay | Admitting: Family Medicine

## 2022-10-03 DIAGNOSIS — R6889 Other general symptoms and signs: Secondary | ICD-10-CM

## 2022-10-03 MED ORDER — HYDROCOD POLI-CHLORPHE POLI ER 10-8 MG/5ML PO SUER: 5.0000 mL | Freq: Two times a day (BID) | ORAL | 0 refills | Status: AC | PRN

## 2022-10-03 MED ORDER — OSELTAMIVIR PHOSPHATE 75 MG PO CAPS: 75.0000 mg | ORAL_CAPSULE | Freq: Two times a day (BID) | ORAL | 0 refills | Status: DC

## 2022-10-03 MED ORDER — BENZONATATE 200 MG PO CAPS: 200.0000 mg | ORAL_CAPSULE | Freq: Two times a day (BID) | ORAL | 0 refills | Status: DC | PRN

## 2022-10-03 NOTE — Patient Instructions (Addendum)
Please take a COVID test. If your COVID test is negative, you can take the Tamiflu since you've had recent Flu exposure.  - information sheet attached  Also sending you in some cough pills and cough syrup to use as needed.  Continue supportive measures including rest, hydration, humidifier use, steam showers, warm compresses to sinuses, warm liquids with lemon and honey, and over-the-counter cough, cold, and analgesics as needed.   Please contact office for follow-up if symptoms do not improve or worsen. Seek emergency care if symptoms become severe.   Over the counter medications that may be helpful for symptoms:  Guaifenesin 1200 mg extended release tabs twice daily, with plenty of water For cough and congestion Brand name: Mucinex   Pseudoephedrine 30 mg, one or two tabs every 4 to 6 hours For sinus congestion Brand name: Sudafed You must get this from the pharmacy counter.  Oxymetazoline nasal spray each morning, one spray in each nostril, for NO MORE THAN 3 days  For nasal and sinus congestion Brand name: Afrin Saline nasal spray or Saline Nasal Irrigation 3-5 times a day For nasal and sinus congestion Brand names: Ocean or AYR Fluticasone nasal spray, one spray in each nostril, each morning (after oxymetazoline and saline, if used) For nasal and sinus congestion Brand name: Flonase Warm salt water gargles  For sore throat Every few hours as needed Alternate ibuprofen 400-600 mg and acetaminophen 1000 mg every 4-6 hours For fever, body aches, headache Brand names: Motrin or Advil and Tylenol Dextromethorphan 12-hour cough version 30 mg every 12 hours  For cough Brand name: Delsym Stop all other cold medications for now (Nyquil, Dayquil, Tylenol Cold, Theraflu, etc) and other non-prescription cough/cold preparations. Many of these have the same ingredients listed above and could cause an overdose of medication.   Herbal treatments that have been shown to be helpful in some  patients include: Vitamin C '1000mg'$  per day Vitamin D 4000iU per day Zinc '100mg'$  per day Quercetin 25-'500mg'$  twice a day Melatonin 5-'10mg'$  at bedtime  General Instructions Allow your body to rest Drink PLENTY of fluids Isolate yourself from everyone, even family, until test results have returned  If your COVID-19 test is positive Then you ARE INFECTED and you can pass the virus to others You must quarantine from others for a minimum of  5 days since symptoms started AND You are fever free for 24 hours WITHOUT any medication to reduce fever AND Your symptoms are improving Wear mask for the next 5 days If not improved by day 5, quarantine for the full 10 days. Do not go to the store or other public areas Do not go around household members who are not known to be infected with COVID-19 If you MUST leave your area of quarantine (example: go to a bathroom you share with others in your home), you must Wear a mask Wash your hands thoroughly Wipe down any surfaces you touch Do not share food, drinks, towels, or other items with other persons Dispose of your own tissues, food containers, etc  Once you have recovered, please continue good preventive care measures, including:  wearing a mask when in public wash your hands frequently avoid touching your face/nose/eyes cover coughs/sneezes with the inside of your elbow stay out of crowds keep a 6 foot distance from others  If you develop severe shortness of breath, uncontrolled fevers, coughing up blood, confusion, chest pain, or signs of dehydration (such as significantly decreased urine amounts or dizziness with standing) please go  to the ER.

## 2022-10-03 NOTE — Progress Notes (Signed)
Virtual Video Visit via MyChart Note  I connected with  Kaitlyn Bowman on 10/03/22 at 11:00 AM EST by the video enabled telemedicine application for MyChart, and verified that I am speaking with the correct person using two identifiers.   I introduced myself as a Designer, jewellery with the practice. We discussed the limitations of evaluation and management by telemedicine and the availability of in person appointments. The patient expressed understanding and agreed to proceed.  Participating parties in this visit include: The patient and the nurse practitioner listed.  The patient is: At home I am: In the office - [facility]  Subjective:    CC:  Chief Complaint  Patient presents with   Nasal Congestion   Fever   Generalized Body Aches   Chills   Cough    No covid test and symptom started yesterday   Sore Throat    HPI: Kaitlyn Bowman is a 61 y.o. year old female presenting today via [place] today for URI symptoms.  Patient reports that yesterday she started feeling horribly very quickly. Symptoms have included productive cough, nasal congestion, fever, chills, body aches, sore throat. States it came on so quick like the flu. Reports her sister was staying with her for Christmas and her grandson has the flu - sister started with symptoms while staying with patient. She has not taken a COVID test yet. She denies any chest pain, dyspnea, wheezing, GI/GU symptoms. So far she has gotten some relief with Tylenol, Mucinex, tea with honey and lemon.     Past medical history, Surgical history, Family history not pertinant except as noted below, Social history, Allergies, and medications have been entered into the medical record, reviewed, and corrections made.   Review of Systems:  All review of systems negative except what is listed in the HPI   Objective:    General:  Speaking clearly in complete sentences. Absent shortness of breath noted.   Alert  and oriented x3.   Normal judgment.  Absent acute distress.   Impression and Recommendations:    1. Flu-like symptoms - benzonatate (TESSALON) 200 MG capsule; Take 1 capsule (200 mg total) by mouth 2 (two) times daily as needed for cough.  Dispense: 20 capsule; Refill: 0 - chlorpheniramine-HYDROcodone (TUSSIONEX) 10-8 MG/5ML; Take 5 mLs by mouth every 12 (twelve) hours as needed for up to 5 days.  Dispense: 50 mL; Refill: 0 - oseltamivir (TAMIFLU) 75 MG capsule; Take 1 capsule (75 mg total) by mouth 2 (two) times daily.  Dispense: 10 capsule; Refill: 0  Please take a COVID test. If your COVID test is negative, you can take the Tamiflu since you've had recent Flu exposure.  - information sheet attached  Also sending you in some cough pills and cough syrup to use as needed.  Continue supportive measures including rest, hydration, humidifier use, steam showers, warm compresses to sinuses, warm liquids with lemon and honey, and over-the-counter cough, cold, and analgesics as needed.   Please contact office for follow-up if symptoms do not improve or worsen. Seek emergency care if symptoms become severe.      I discussed the assessment and treatment plan with the patient. The patient was provided an opportunity to ask questions and all were answered. The patient agreed with the plan and demonstrated an understanding of the instructions.   The patient was advised to call back or seek an in-person evaluation if the symptoms worsen or if the condition fails to improve  as anticipated.    Terrilyn Saver, NP

## 2022-10-10 ENCOUNTER — Telehealth: Payer: Self-pay | Admitting: *Deleted

## 2022-10-10 NOTE — Telephone Encounter (Signed)
Noted  

## 2022-10-10 NOTE — Telephone Encounter (Signed)
Pt called this afternoon just wanting you to have a heads up for her appt with you on Monday.  She stated that she has been having URI-type symptoms for a few years now since moving out of a home with mold.  She is requesting some type of further work up so I scheduled her to come in to discuss in detail.

## 2022-10-16 ENCOUNTER — Ambulatory Visit (INDEPENDENT_AMBULATORY_CARE_PROVIDER_SITE_OTHER): Payer: Commercial Managed Care - HMO | Admitting: Family

## 2022-10-16 VITALS — BP 129/73 | HR 64 | Temp 97.9°F | Resp 16 | Wt 220.0 lb

## 2022-10-16 DIAGNOSIS — J329 Chronic sinusitis, unspecified: Secondary | ICD-10-CM | POA: Diagnosis not present

## 2022-10-16 MED ORDER — AMOXICILLIN-POT CLAVULANATE 875-125 MG PO TABS: 1.0000 | ORAL_TABLET | Freq: Two times a day (BID) | ORAL | 0 refills | Status: DC

## 2022-10-16 NOTE — Assessment & Plan Note (Signed)
New.  I suspect that she had flu which has now turned into a sinus infection. Will rx with augmentin. Pt is advised to call if symptoms worsen or if symptoms are not improved in 2-3 days.

## 2022-10-16 NOTE — Progress Notes (Signed)
Subjective:   By signing my name below, I, Kaitlyn Bowman, attest that this documentation has been prepared under the direction and in the presence of Kaitlyn Alar, NP. 10/16/2022   Patient ID: Kaitlyn Bowman, female    DOB: 06-08-1961, 62 y.o.   MRN: [id number]  Chief Complaint  Patient presents with   Sore Throat    Patient complains of sore throat since 10/02/22.    Nasal Congestion    Complains of nasal congestion    Sore Throat  Associated symptoms include congestion.   Patient is in today for a office visit.   She complains of URI since October 02 2022. She reports developing a fever, chills, body aches, and congestion that day. She continues experiencing symptoms at this time but notes her symptoms have improved. Patient is also experiencing post-nasal drip and sore throat, chest tightness, pain in cheeks and forehead. Patient reports a coworker had RSV and was not wearing a mask.    Past Medical History:  Diagnosis Date   Abnormal TSH    Alcohol abuse    Borderline diabetes    Cocaine abuse ([place])    Remote crack cocaine use. UDS negative for cocaine 01/2012   Coronary artery disease     inferior STEMI 01/13/12 s/p PTCA/DES to mid RCA. Initial EF 45% by cath but improved to 55-60% by echo 01/14/12   Dyslipidemia    Trig 201, HDL 36, LDL 197 01/2012   GERD (gastroesophageal reflux disease)    History of MI (myocardial infarction) 01/2012   Hypothyroidism    Myocardial infarction [facility]) 4917   Umbilical hernia     Past Surgical History:  Procedure Laterality Date   ABDOMINAL HYSTERECTOMY  1981   partial   CARDIAC CATHETERIZATION  2010, 2013   Negative. 2013--100% blockage   HERNIA REPAIR  2017   abd hernia    LEFT HEART CATHETERIZATION WITH CORONARY ANGIOGRAM N/A 01/12/2012   Procedure: LEFT HEART CATHETERIZATION WITH CORONARY ANGIOGRAM;  Surgeon: Burnell Blanks, MD;  Location: [facility] CATH LAB;  Service: Cardiovascular;  Laterality: N/A;    Family History   Problem Relation Age of Onset   Heart attack Mother 32       prior pacemaker   Heart disease Mother    Diabetes Mother    Heart failure Father        CHF   Heart disease Father    Alcoholism Father    Stroke Brother     Social History   Socioeconomic History   Marital status: Single    Spouse name: Not on file   Number of children: 1   Years of education: Not on file   Highest education level: GED or equivalent  Occupational History   Occupation: Best boy: [employer]  Tobacco Use   Smoking status: Former    Years: 33.00    Types: Cigarettes    Quit date: 06/13/2008    Years since quitting: 14.3   Smokeless tobacco: Never   Tobacco comments:    None since 2009   Substance and Sexual Activity   Alcohol use: No    Comment: former   Drug use: No    Types: "Crack" cocaine    Comment: Crack cocaine; none since Jan 2006    Sexual activity: Not on file  Other Topics Concern   Not on file  Social History Narrative   Regular exercise:  Walks 5-7 x weekly   Caffeine use:  1 daily  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   Enjoys reading, sewing, Fill in puzzles, walking   Completed GED.   No pets.               Social Determinants of Health   Financial Resource Strain: Low Risk  (02/21/2021)   Overall Financial Resource Strain (CARDIA)    Difficulty of Paying Living Expenses: Not very hard  Food Insecurity: No Food Insecurity (02/21/2021)   Hunger Vital Sign    Worried About Running Out of Food in the Last Year: Never true    Ran Out of Food in the Last Year: Never true  Transportation Needs: No Transportation Needs (02/21/2021)   PRAPARE - Hydrologist (Medical): No    Lack of Transportation (Non-Medical): No  Physical Activity: Insufficiently Active (02/21/2021)   Exercise Vital Sign    Days of Exercise per Week: 4 days    Minutes of Exercise per  Session: 20 min  Stress: No Stress Concern Present (02/21/2021)   [place]    Feeling of Stress : Not at all  Social Connections: Moderately Isolated (02/21/2021)   Social Connection and Isolation Panel [NHANES]    Frequency of Communication with Friends and Family: More than three times a week    Frequency of Social Gatherings with Friends and Family: Twice a week    Attends Religious Services: More than 4 times per year    Active Member of [employer] or Organizations: No    Attends Music therapist: Not on file    Marital Status: Never married  Intimate Partner Violence: Not on file    Outpatient Medications Prior to Visit  Medication Sig Dispense Refill   amLODipine (NORVASC) 5 MG tablet Take 1 tablet (5 mg total) by mouth daily. 90 tablet 3   aspirin EC 81 MG tablet Take 1 tablet (81 mg total) by mouth daily. 90 tablet 3   nitroGLYCERIN (NITROSTAT) 0.4 MG SL tablet Place 1 tablet (0.4 mg total) under the tongue every 5 (five) minutes x 3 doses as needed for chest pain. 25 tablet 6   sodium chloride (OCEAN) 0.65 % SOLN nasal spray Place 1 spray into both nostrils as needed for congestion. 15 mL 0   benzonatate (TESSALON) 200 MG capsule Take 1 capsule (200 mg total) by mouth 2 (two) times daily as needed for cough. 20 capsule 0   oseltamivir (TAMIFLU) 75 MG capsule Take 1 capsule (75 mg total) by mouth 2 (two) times daily. 10 capsule 0   No facility-administered medications prior to visit.    Allergies  Allergen Reactions   Ciprofloxacin     REACTION: nause-sweating-chest tightness   Atorvastatin     Myalgia    Crestor [Rosuvastatin Calcium] Nausea Only   Nexlizet [Bempedoic Acid-Ezetimibe] Other (See Comments)    myalgia   Livalo [Pitavastatin]     myalgia   Praluent [Alirocumab]     Muscle aches    Review of Systems  Constitutional:  Positive for chills and fever (on 10/02/2022).  HENT:   Positive for congestion, sinus pain (frontal and maxillary) and sore throat.        (+)post-nasal drip (+)rhinorrhea  Respiratory:         (+)chest tightness  Musculoskeletal:  Positive for myalgias (general).       Objective:  Physical Exam Constitutional:      General: She is not in acute distress.    Appearance: Normal appearance. She is not ill-appearing.  HENT:     Head: Normocephalic and atraumatic.     Right Ear: Tympanic membrane, ear canal and external ear normal.     Left Ear: Tympanic membrane, ear canal and external ear normal.     Mouth/Throat:     Mouth: Mucous membranes are moist.     Pharynx: Oropharynx is clear. No oropharyngeal exudate or posterior oropharyngeal erythema.  Eyes:     Extraocular Movements: Extraocular movements intact.     Pupils: Pupils are equal, round, and reactive to light.  Cardiovascular:     Rate and Rhythm: Normal rate and regular rhythm.     Heart sounds: Normal heart sounds. No murmur heard.    No gallop.  Pulmonary:     Effort: Pulmonary effort is normal. No respiratory distress.     Breath sounds: Normal breath sounds. No wheezing.  Lymphadenopathy:     Cervical: Cervical adenopathy (mild) present.  Skin:    General: Skin is warm and dry.  Neurological:     Mental Status: She is alert and oriented to person, place, and time.  Psychiatric:        Judgment: Judgment normal.     BP 129/73 (BP Location: Right Arm, Patient Position: Sitting, Cuff Size: Small)   Pulse 64   Temp 97.9 F (36.6 C) (Oral)   Resp 16   Wt 220 lb (99.8 kg)   SpO2 99%   BMI 34.46 kg/m  Wt Readings from Last 3 Encounters:  10/16/22 220 lb (99.8 kg)  08/15/22 221 lb (100.2 kg)  08/01/22 222 lb (100.7 kg)       Assessment & Plan:  Sinusitis, unspecified chronicity, unspecified location Assessment & Plan: New.  I suspect that she had flu which has now turned into a sinus infection. Will rx with augmentin. Pt is advised to call if symptoms  worsen or if symptoms are not improved in 2-3 days.    Other orders -     Amoxicillin-Pot Clavulanate; Take 1 tablet by mouth 2 (two) times daily.  Dispense: 20 tablet; Refill: 0    I, Nance Pear, NP, personally preformed the services described in this documentation.  All medical record entries made by the scribe were at my direction and in my presence.  I have reviewed the chart and discharge instructions (if applicable) and agree that the record reflects my personal performance and is accurate and complete. 10/16/2022   I,Kaitlyn Bowman,acting as a Education administrator for Nance Pear, NP.,have documented all relevant documentation on the behalf of Nance Pear, NP,as directed by  Nance Pear, NP while in the presence of Nance Pear, NP.   Nance Pear, NP

## 2022-10-23 ENCOUNTER — Telehealth: Payer: Self-pay | Admitting: Family

## 2022-10-23 NOTE — Telephone Encounter (Signed)
Patient scheduled for tomorrow

## 2022-10-23 NOTE — Telephone Encounter (Signed)
Pt states she has been taking the amoxicillin as prescribed and she is not feeling any better. She would like to know if a nurse could call and recommend anything.

## 2022-10-24 ENCOUNTER — Ambulatory Visit (INDEPENDENT_AMBULATORY_CARE_PROVIDER_SITE_OTHER): Payer: Commercial Managed Care - HMO | Admitting: Family

## 2022-10-24 VITALS — BP 139/67 | HR 64 | Temp 98.0°F | Resp 16 | Wt 221.0 lb

## 2022-10-24 DIAGNOSIS — J018 Other acute sinusitis: Secondary | ICD-10-CM

## 2022-10-24 MED ORDER — FLUCONAZOLE 150 MG PO TABS: 150.0000 mg | ORAL_TABLET | Freq: Once | ORAL | 0 refills | Status: AC

## 2022-10-24 MED ORDER — AMOXICILLIN-POT CLAVULANATE 875-125 MG PO TABS: 1.0000 | ORAL_TABLET | Freq: Two times a day (BID) | ORAL | 0 refills | Status: DC

## 2022-10-24 NOTE — Progress Notes (Signed)
Subjective:   By signing my name below, I, Eugene Gavia, attest that this documentation has been prepared under the direction and in the presence of Nance Pear, NP 10/24/22   Patient ID: Kaitlyn Bowman, female    DOB: [birth year], 62 y.o.   MRN: [id number]  Chief Complaint  Patient presents with   Sore Throat    Still having sore throat   Nasal Congestion    "Still having congestion"     HPI Patient is in today for sinusitis follow up.  Sinusitis: Patient was started on Augmentin at our last visit on 10/16/22. She has 2 remaining doses of Augmentin to take. She has had URI symptoms since 10/02/22. Today, she states that she continues to feel poorly. She has had mild relief of her sinus pressure/pain with the Augmentin. She reports having persistent chest congestion, productive cough, sore throat, postnasal drip, nasal congestion.   GI upset: She reports that since starting the Augmentin she has had nausea. She would also like a prescription for Diflucan as she is prone to antibiotic-related yeast infections.  Past Medical History:  Diagnosis Date   Abnormal TSH    Alcohol abuse    Borderline diabetes    Cocaine abuse ([place])    Remote crack cocaine use. UDS negative for cocaine 01/2012   Coronary artery disease     inferior STEMI 01/13/12 s/p PTCA/DES to mid RCA. Initial EF 45% by cath but improved to 55-60% by echo 01/14/12   Dyslipidemia    Trig 201, HDL 36, LDL 197 01/2012   GERD (gastroesophageal reflux disease)    History of MI (myocardial infarction) 01/2012   Hypothyroidism    Myocardial infarction [facility]) 3710   Umbilical hernia     Past Surgical History:  Procedure Laterality Date   ABDOMINAL HYSTERECTOMY  1981   partial   CARDIAC CATHETERIZATION  2010, 2013   Negative. 2013--100% blockage   HERNIA REPAIR  2017   abd hernia    LEFT HEART CATHETERIZATION WITH CORONARY ANGIOGRAM N/A 01/12/2012   Procedure: LEFT HEART CATHETERIZATION WITH CORONARY ANGIOGRAM;   Surgeon: Burnell Blanks, MD;  Location: [facility] C[facility] LAB;  Service: Cardiovascular;  Laterality: N/A;    Family History  Problem Relation Age of Onset   Heart attack Mother 81       prior pacemaker   Heart disease Mother    Diabetes Mother    Heart failure Father        CHF   Heart disease Father    Alcoholism Father    Stroke Brother     Social History   Socioeconomic History   Marital status: Single    Spouse name: Not on file   Number of children: 1   Years of education: Not on file   Highest education level: GED or equivalent  Occupational History   Occupation: Best boy: [employer]  Tobacco Use   Smoking status: Former    Years: 33.00    Types: Cigarettes    Quit date: 06/13/2008    Years since quitting: 14.3   Smokeless tobacco: Never   Tobacco comments:    None since 2009   Substance and Sexual Activity   Alcohol use: No    Comment: former   Drug use: No    Types: "Crack" cocaine    Comment: Crack cocaine; none since Jan 2006    Sexual activity: Not on file  Other Topics Concern   Not on file  Social History Narrative   Regular exercise:  Walks 5-7 x weekly   Caffeine use:  1 daily   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   Enjoys reading, sewing, Fill in puzzles, walking   Completed GED.   No pets.               Social Determinants of Health   Financial Resource Strain: Low Risk  (02/21/2021)   Overall Financial Resource Strain (CARDIA)    Difficulty of Paying Living Expenses: Not very hard  Food Insecurity: No Food Insecurity (02/21/2021)   Hunger Vital Sign    Worried About Running Out of Food in the Last Year: Never true    Ran Out of Food in the Last Year: Never true  Transportation Needs: No Transportation Needs (02/21/2021)   PRAPARE - Hydrologist (Medical): No    Lack of Transportation (Non-Medical): No  Physical Activity:  Insufficiently Active (02/21/2021)   Exercise Vital Sign    Days of Exercise per Week: 4 days    Minutes of Exercise per Session: 20 min  Stress: No Stress Concern Present (02/21/2021)   [place]    Feeling of Stress : Not at all  Social Connections: Moderately Isolated (02/21/2021)   Social Connection and Isolation Panel [NHANES]    Frequency of Communication with Friends and Family: More than three times a week    Frequency of Social Gatherings with Friends and Family: Twice a week    Attends Religious Services: More than 4 times per year    Active Member of [employer] or Organizations: No    Attends Music therapist: Not on file    Marital Status: Never married  Intimate Partner Violence: Not on file    Outpatient Medications Prior to Visit  Medication Sig Dispense Refill   amLODipine (NORVASC) 5 MG tablet Take 1 tablet (5 mg total) by mouth daily. 90 tablet 3   aspirin EC 81 MG tablet Take 1 tablet (81 mg total) by mouth daily. 90 tablet 3   nitroGLYCERIN (NITROSTAT) 0.4 MG SL tablet Place 1 tablet (0.4 mg total) under the tongue every 5 (five) minutes x 3 doses as needed for chest pain. 25 tablet 6   sodium chloride (OCEAN) 0.65 % SOLN nasal spray Place 1 spray into both nostrils as needed for congestion. 15 mL 0   amoxicillin-clavulanate (AUGMENTIN) 875-125 MG tablet Take 1 tablet by mouth 2 (two) times daily. 20 tablet 0   No facility-administered medications prior to visit.    Allergies  Allergen Reactions   Ciprofloxacin     REACTION: nause-sweating-chest tightness   Atorvastatin     Myalgia    Crestor [Rosuvastatin Calcium] Nausea Only   Nexlizet [Bempedoic Acid-Ezetimibe] Other (See Comments)    myalgia   Livalo [Pitavastatin]     myalgia   Praluent [Alirocumab]     Muscle aches    Review of Systems  Constitutional:  Negative for fever.  HENT:  Positive for congestion, sinus pain and  sore throat.        + postnasal drip + chest congestion  Respiratory:  Positive for cough and sputum production. Negative for shortness of breath and wheezing.   Cardiovascular:  Negative for chest pain and palpitations.  Gastrointestinal:  Positive for nausea. Negative for blood in stool, constipation and  vomiting.  Genitourinary:  Negative for dysuria, frequency and hematuria.  Musculoskeletal:  Negative for joint pain and myalgias.       Objective:    Physical Exam Constitutional:      General: She is not in acute distress.    Appearance: Normal appearance. She is not ill-appearing.  HENT:     Head: Normocephalic and atraumatic.     Right Ear: External ear normal.     Left Ear: External ear normal.     Nose:     Right Sinus: Maxillary sinus tenderness and frontal sinus tenderness present.     Left Sinus: Maxillary sinus tenderness and frontal sinus tenderness present.  Eyes:     Extraocular Movements: Extraocular movements intact.     Pupils: Pupils are equal, round, and reactive to light.  Cardiovascular:     Rate and Rhythm: Normal rate and regular rhythm.     Heart sounds: Normal heart sounds. No murmur heard.    No gallop.  Pulmonary:     Effort: Pulmonary effort is normal. No respiratory distress.     Breath sounds: Normal breath sounds. No wheezing or rales.  Skin:    General: Skin is warm and dry.  Neurological:     Mental Status: She is alert and oriented to person, place, and time.  Psychiatric:        Judgment: Judgment normal.     BP 139/67 (BP Location: Left Arm, Patient Position: Sitting, Cuff Size: Large)   Pulse 64   Temp 98 F (36.7 C) (Oral)   Resp 16   Wt 221 lb (100.2 kg)   SpO2 98%   BMI 34.61 kg/m  Wt Readings from Last 3 Encounters:  10/24/22 221 lb (100.2 kg)  10/16/22 220 lb (99.8 kg)  08/15/22 221 lb (100.2 kg)       Assessment & Plan:  Other acute sinusitis, recurrence not specified Assessment & Plan: She is improving on  augmentin, but slowly. She does not feel ready to return to work. Recommended that she complete an additional 4 days of augmentin. Nasal saline rinses twice daily. Not provided to return to work on 1/18. She will let me know if her symptoms do not improve.    Other orders -     Amoxicillin-Pot Clavulanate; Take 1 tablet by mouth 2 (two) times daily.  Dispense: 8 tablet; Refill: 0 -     Fluconazole; Take 1 tablet (150 mg total) by mouth once for 1 dose. As needed for vaginal yeast infection  Dispense: 1 tablet; Refill: 0     I,Alexis Herring,acting as a scribe for Nance Pear, NP.,have documented all relevant documentation on the behalf of Nance Pear, NP,as directed by  Nance Pear, NP while in the presence of Nance Pear, NP.   I, Nance Pear, NP, personally preformed the services described in this documentation.  All medical record entries made by the scribe were at my direction and in my presence.  I have reviewed the chart and discharge instructions (if applicable) and agree that the record reflects my personal performance and is accurate and complete. 10/24/22   Nance Pear, NP

## 2022-10-24 NOTE — Assessment & Plan Note (Addendum)
She is improving on augmentin, but slowly. She does not feel ready to return to work. Recommended that she complete an additional 4 days of augmentin. Nasal saline rinses twice daily. Not provided to return to work on 1/18. She will let me know if her symptoms do not improve.

## 2022-10-25 ENCOUNTER — Encounter (HOSPITAL_BASED_OUTPATIENT_CLINIC_OR_DEPARTMENT_OTHER): Payer: Self-pay | Admitting: Emergency Medicine

## 2022-10-25 ENCOUNTER — Emergency Department (HOSPITAL_BASED_OUTPATIENT_CLINIC_OR_DEPARTMENT_OTHER)
Admission: EM | Admit: 2022-10-25 | Discharge: 2022-10-25 | Disposition: A | Discharge status: Home / Self Care | Payer: Commercial Managed Care - HMO | Attending: Emergency Medicine | Admitting: Emergency Medicine

## 2022-10-25 ENCOUNTER — Emergency Department (HOSPITAL_BASED_OUTPATIENT_CLINIC_OR_DEPARTMENT_OTHER): Payer: Commercial Managed Care - HMO

## 2022-10-25 ENCOUNTER — Other Ambulatory Visit: Payer: Self-pay

## 2022-10-25 DIAGNOSIS — Z20822 Contact with and (suspected) exposure to covid-19: Secondary | ICD-10-CM | POA: Diagnosis not present

## 2022-10-25 DIAGNOSIS — I2510 Atherosclerotic heart disease of native coronary artery without angina pectoris: Secondary | ICD-10-CM | POA: Diagnosis not present

## 2022-10-25 DIAGNOSIS — Z7982 Long term (current) use of aspirin: Secondary | ICD-10-CM | POA: Insufficient documentation

## 2022-10-25 DIAGNOSIS — B349 Viral infection, unspecified: Secondary | ICD-10-CM | POA: Diagnosis not present

## 2022-10-25 DIAGNOSIS — R5383 Other fatigue: Secondary | ICD-10-CM | POA: Diagnosis present

## 2022-10-25 LAB — RESP PANEL BY RT-PCR (RSV, FLU A&B, COVID)  RVPGX2
Influenza A by PCR: NEGATIVE
Influenza B by PCR: NEGATIVE
Resp Syncytial Virus by PCR: NEGATIVE
SARS Coronavirus 2 by RT PCR: NEGATIVE

## 2022-10-25 MED ORDER — GUAIFENESIN 200 MG PO TABS: 200.0000 mg | ORAL_TABLET | Freq: Four times a day (QID) | ORAL | 0 refills | Status: DC | PRN

## 2022-10-25 MED ORDER — BENZONATATE 100 MG PO CAPS: 100.0000 mg | ORAL_CAPSULE | Freq: Three times a day (TID) | ORAL | 0 refills | Status: DC

## 2022-10-25 NOTE — ED Triage Notes (Signed)
Pt has been seen several times recently for sinus issues; sts she has been sick since Christmas; reports fatigue, chest tightness; sts she's been exposed to RSV

## 2022-10-25 NOTE — Discharge Instructions (Addendum)
You have been evaluated for your symptoms.  Fortunately chest x-ray did not show any signs of pneumonia.  You tested negative for COVID flu and RSV.  Your symptoms likely are residual lingering symptoms from a viral infection.  Take medication prescribed as needed.  Return if you have any concern.

## 2022-10-25 NOTE — ED Provider Notes (Signed)
[facility] EMERGENCY DEPARTMENT Provider Note   CSN: [id number] Arrival date & time: 10/25/22  1102     History  Chief Complaint  Patient presents with   Fatigue    Kaitlyn Bowman is a 62 y.o. female.  The history is provided by the patient and medical records. No language interpreter was used.     62 year old female significant history of polysubstance abuse including cocaine abuse, CAD, alcohol abuse, prior MI, GERD presenting with complaints of sinus discomfort.  Patient report for the past 3 weeks she has had cold symptoms.  Symptoms including fever, chills, body aches, congestion, cough, and decrease in appetite.  Her symptom is improving but not fully resolved.  She still cough at night.  She is having trouble sleeping still.  She has been seen evaluated by her doctor over a week ago for her complaint and at that time she was prescribed Augmentin prophylactically for her symptoms.  She has been compliant with her medication and did notice some improvement.  Her family member mentioned that she was "breathing abnormal" and recommend for her to come to be evaluated.  Patient denies significant shortness of breath.  Patient states that she is a recovering addict and has been clean for the past 13 years.  Home Medications Prior to Admission medications   Medication Sig Start Date End Date Taking? Authorizing Provider  amLODipine (NORVASC) 5 MG tablet Take 1 tablet (5 mg total) by mouth daily. 07/31/22   Burnell Blanks, MD  amoxicillin-clavulanate (AUGMENTIN) 875-125 MG tablet Take 1 tablet by mouth 2 (two) times daily. 10/24/22   [name], NP  aspirin EC 81 MG tablet Take 1 tablet (81 mg total) by mouth daily. 06/16/13   Burnell Blanks, MD  nitroGLYCERIN (NITROSTAT) 0.4 MG SL tablet Place 1 tablet (0.4 mg total) under the tongue every 5 (five) minutes x 3 doses as needed for chest pain. 01/18/15   Burnell Blanks, MD  sodium chloride (OCEAN)  0.65 % SOLN nasal spray Place 1 spray into both nostrils as needed for congestion. 09/19/18   Nche, Charlene Brooke, NP      Allergies    Ciprofloxacin, Atorvastatin, Crestor [rosuvastatin calcium], Nexlizet [bempedoic acid-ezetimibe], Livalo [pitavastatin], and Praluent [alirocumab]    Review of Systems   Review of Systems  All other systems reviewed and are negative.   Physical Exam Updated Vital Signs BP (!) 149/73 (BP Location: Left Arm)   Pulse 62   Temp 98.1 F (36.7 C) (Oral)   Resp 18   SpO2 97%  Physical Exam Vitals and nursing note reviewed.  Constitutional:      General: She is not in acute distress.    Appearance: She is well-developed.  HENT:     Head: Atraumatic.     Right Ear: Tympanic membrane normal.     Left Ear: Tympanic membrane normal.     Nose: Nose normal.     Mouth/Throat:     Mouth: Mucous membranes are moist.  Eyes:     Conjunctiva/sclera: Conjunctivae normal.  Cardiovascular:     Rate and Rhythm: Normal rate and regular rhythm.     Pulses: Normal pulses.     Heart sounds: Normal heart sounds.  Pulmonary:     Effort: Pulmonary effort is normal.     Breath sounds: No wheezing, rhonchi or rales.  Abdominal:     Palpations: Abdomen is soft.     Tenderness: There is no abdominal tenderness.  Musculoskeletal:  Cervical back: Normal range of motion and neck supple. No rigidity.  Skin:    Findings: No rash.  Neurological:     Mental Status: She is alert.  Psychiatric:        Mood and Affect: Mood normal.     ED Results / Procedures / Treatments   Labs (all labs ordered are listed, but only abnormal results are displayed) Labs Reviewed  RESP PANEL BY RT-PCR (RSV, FLU A&B, COVID)  [id number]    EKG None ED ECG REPORT   Date: 10/25/2022  Rate: 59  Rhythm: normal sinus rhythm  QRS Axis: normal  Intervals: normal  ST/T Wave abnormalities: normal  Conduction Disutrbances:none  Narrative Interpretation:   Old EKG Reviewed:  unchanged  I have personally reviewed the EKG tracing and agree with the computerized printout as noted.   Radiology DG Chest 2 View  Result Date: 10/25/2022 CLINICAL DATA:  Chest tightness EXAM: CHEST - 2 VIEW COMPARISON:  Chest x-ray March 21, 2022 FINDINGS: The cardiomediastinal silhouette is unchanged in contour. Low lung volumes with bronchovascular crowding. No focal pulmonary opacity. No pleural effusion or pneumothorax. The visualized upper abdomen is unremarkable. No acute osseous abnormality. IMPRESSION: No acute cardiopulmonary abnormality. Electronically Signed   By: Beryle Flock M.D.   On: 10/25/2022 14:30    Procedures Procedures    Medications Ordered in ED Medications - No data to display  ED Course/ Medical Decision Making/ A&P                             Medical Decision Making Amount and/or Complexity of Data Reviewed Radiology: ordered.   BP (!) 149/73 (BP Location: Left Arm)   Pulse 62   Temp 98.1 F (36.7 C) (Oral)   Resp 18   SpO2 97%   60:43 PM 62 year old female significant history of polysubstance abuse including cocaine abuse, CAD, alcohol abuse, prior MI, GERD presenting with complaints of sinus discomfort.  Patient report for the past 3 weeks she has had cold symptoms.  Symptoms including fever, chills, body aches, congestion, cough, and decrease in appetite.  Her symptom is improving but not fully resolved.  She still cough at night.  She is having trouble sleeping still.  She has been seen evaluated by her doctor over a week ago for her complaint and at that time she was prescribed Augmentin prophylactically for her symptoms.  She has been compliant with her medication and did notice some improvement.  Her family member mentioned that she was "breathing abnormal" and recommend for her to come to be evaluated.  Patient denies significant shortness of breath.  Patient states that she is a recovering addict and has been clean for the past 13 years.   Patient mention her symptoms started shortly after she was exposed to family members with RSV around [place].  On exam this is a well-appearing female resting comfortably appears to be in no acute discomfort.  Ear nose and throat exam unremarkable.  Lungs are clear to auscultation, abdomen soft nontender, heart with normal rate and rhythm.  She is speaking complete sentences and there is no wheezes rales or rhonchi heard on lung exam.  -Labs ordered, independently viewed and interpreted by me.  Labs remarkable for negative covid, flu, rsv -The patient was maintained on a cardiac monitor.  I personally viewed and interpreted the cardiac monitored which showed an underlying rhythm of: NSR -Imaging independently viewed and interpreted by me and I  agree with radiologist's interpretation.  Result remarkable for CXR without acute changes -This patient presents to the ED for concern of cold sxs, this involves an extensive number of treatment options, and is a complaint that carries with it a high risk of complications and morbidity.  The differential diagnosis includes covid, flu, rsv, pna, pe, drug induced cough, gerd -Co morbidities that complicate the patient evaluation includes cad, gerd -Treatment includes supportive care -Reevaluation of the patient after these medicines showed that the patient improved -PCP office notes or outside notes reviewed -Escalation to admission/observation considered: patients feels much better, is comfortable with discharge, and will follow up with PCP -Prescription medication considered, patient comfortable with tessalon -Social Determinant of Health considered which includes social isolation, tobacco use, inactivity.           Final Clinical Impression(s) / ED Diagnoses Final diagnoses:  None    Rx / DC Orders ED Discharge Orders     None         Domenic Moras, PA-C 10/25/22 1455    Fransico Meadow, MD 10/27/22 1423

## 2022-11-07 ENCOUNTER — Ambulatory Visit (INDEPENDENT_AMBULATORY_CARE_PROVIDER_SITE_OTHER): Payer: Commercial Managed Care - HMO | Admitting: Family

## 2022-11-07 ENCOUNTER — Other Ambulatory Visit (HOSPITAL_BASED_OUTPATIENT_CLINIC_OR_DEPARTMENT_OTHER): Payer: Self-pay

## 2022-11-07 VITALS — BP 139/76 | HR 66 | Temp 98.7°F | Resp 16 | Wt 222.0 lb

## 2022-11-07 DIAGNOSIS — J018 Other acute sinusitis: Secondary | ICD-10-CM | POA: Diagnosis not present

## 2022-11-07 MED ORDER — PREDNISONE 10 MG PO TABS
ORAL_TABLET | ORAL | 0 refills | Status: DC
  Filled 2022-11-07: qty 20, 8d supply, fill #0

## 2022-11-07 MED ORDER — FLUTICASONE PROPIONATE 50 MCG/ACT NA SUSP: 2.0000 | Freq: Every day | NASAL | 6 refills | Status: DC

## 2022-11-07 MED ORDER — CEFDINIR 300 MG PO CAPS
300.0000 mg | ORAL_CAPSULE | Freq: Two times a day (BID) | ORAL | 0 refills | Status: DC
  Filled 2022-11-07: qty 20, 10d supply, fill #0

## 2022-11-07 MED ORDER — LORATADINE 10 MG PO TABS
10.0000 mg | ORAL_TABLET | Freq: Every day | ORAL | 11 refills | Status: DC
  Filled 2022-11-07: qty 100, 90d supply, fill #0

## 2022-11-07 NOTE — Assessment & Plan Note (Addendum)
Uncontrolled. Recommended cefdinir, prednisone taper, flonase and claritin. She will let me know if her symptoms are not improved in 1 week. Would proceed with CT sinus at that time.

## 2022-11-07 NOTE — Progress Notes (Signed)
Subjective:     Patient ID: Kaitlyn Bowman, female    DOB: Mar 27, 1961, 62 y.o.   MRN: [id number]  Chief Complaint  Patient presents with   Follow-up    Here for ED follow up   Facial Pain    Complains of sinus pain    HPI Patient is in today for ED follow up. I saw her on 1/9 for sinusitis and she was treated with a total of 14 days of antibiotics.  She noted ongoing symptoms and went to the ED on 10/25/22. She had negative covid, flu and RSV.  Notes improvement with augmentin but still has pain/pressure.  Flonase has helped some.     Health Maintenance Due  Topic Date Due   DTaP/Tdap/Td (2 - Td or Tdap) 02/20/2022    Past Medical History:  Diagnosis Date   Abnormal TSH    Alcohol abuse    Borderline diabetes    Cocaine abuse ([place])    Remote crack cocaine use. UDS negative for cocaine 01/2012   Coronary artery disease     inferior STEMI 01/13/12 s/p PTCA/DES to mid RCA. Initial EF 45% by cath but improved to 55-60% by echo 01/14/12   Dyslipidemia    Trig 201, HDL 36, LDL 197 01/2012   GERD (gastroesophageal reflux disease)    History of MI (myocardial infarction) 01/2012   Hypothyroidism    Myocardial infarction [facility]) 4967   Umbilical hernia     Past Surgical History:  Procedure Laterality Date   ABDOMINAL HYSTERECTOMY  1981   partial   CARDIAC CATHETERIZATION  2010, 2013   Negative. 2013--100% blockage   HERNIA REPAIR  2017   abd hernia    LEFT HEART CATHETERIZATION WITH CORONARY ANGIOGRAM N/A 01/12/2012   Procedure: LEFT HEART CATHETERIZATION WITH CORONARY ANGIOGRAM;  Surgeon: Burnell Blanks, MD;  Location: [facility] CATH LAB;  Service: Cardiovascular;  Laterality: N/A;    Family History  Problem Relation Age of Onset   Heart attack Mother 50       prior pacemaker   Heart disease Mother    Diabetes Mother    Heart failure Father        CHF   Heart disease Father    Alcoholism Father    Stroke Brother     Social History   Socioeconomic History   Marital  status: Single    Spouse name: Not on file   Number of children: 1   Years of education: Not on file   Highest education level: GED or equivalent  Occupational History   Occupation: Best boy: [employer]  Tobacco Use   Smoking status: Former    Years: 33.00    Types: Cigarettes    Quit date: 06/13/2008    Years since quitting: 14.4   Smokeless tobacco: Never   Tobacco comments:    None since 2009   Substance and Sexual Activity   Alcohol use: No    Comment: former   Drug use: No    Types: "Crack" cocaine    Comment: Crack cocaine; none since Jan 2006    Sexual activity: Not on file  Other Topics Concern   Not on file  Social History Narrative   Regular exercise:  Walks 5-7 x weekly   Caffeine use:  1 daily   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   Enjoys  reading, sewing, Fill in puzzles, walking   Completed GED.   No pets.               Social Determinants of Health   Financial Resource Strain: Low Risk  (02/21/2021)   Overall Financial Resource Strain (CARDIA)    Difficulty of Paying Living Expenses: Not very hard  Food Insecurity: No Food Insecurity (02/21/2021)   Hunger Vital Sign    Worried About Running Out of Food in the Last Year: Never true    Ran Out of Food in the Last Year: Never true  Transportation Needs: No Transportation Needs (02/21/2021)   PRAPARE - Hydrologist (Medical): No    Lack of Transportation (Non-Medical): No  Physical Activity: Insufficiently Active (02/21/2021)   Exercise Vital Sign    Days of Exercise per Week: 4 days    Minutes of Exercise per Session: 20 min  Stress: No Stress Concern Present (02/21/2021)   [place]    Feeling of Stress : Not at all  Social Connections: Moderately Isolated (02/21/2021)   Social Connection and Isolation Panel [NHANES]     Frequency of Communication with Friends and Family: More than three times a week    Frequency of Social Gatherings with Friends and Family: Twice a week    Attends Religious Services: More than 4 times per year    Active Member of [employer] or Organizations: No    Attends Music therapist: Not on file    Marital Status: Never married  Intimate Partner Violence: Not on file    Outpatient Medications Prior to Visit  Medication Sig Dispense Refill   amLODipine (NORVASC) 5 MG tablet Take 1 tablet (5 mg total) by mouth daily. 90 tablet 3   aspirin EC 81 MG tablet Take 1 tablet (81 mg total) by mouth daily. 90 tablet 3   benzonatate (TESSALON) 100 MG capsule Take 1 capsule (100 mg total) by mouth every 8 (eight) hours. 21 capsule 0   guaiFENesin 200 MG tablet Take 1 tablet (200 mg total) by mouth every 6 (six) hours as needed for cough or to loosen phlegm. 30 tablet 0   nitroGLYCERIN (NITROSTAT) 0.4 MG SL tablet Place 1 tablet (0.4 mg total) under the tongue every 5 (five) minutes x 3 doses as needed for chest pain. 25 tablet 6   sodium chloride (OCEAN) 0.65 % SOLN nasal spray Place 1 spray into both nostrils as needed for congestion. 15 mL 0   amoxicillin-clavulanate (AUGMENTIN) 875-125 MG tablet Take 1 tablet by mouth 2 (two) times daily. 8 tablet 0   fluconazole (DIFLUCAN) 150 MG tablet Take 150 mg by mouth once. (Patient not taking: Reported on 10/25/2022)     No facility-administered medications prior to visit.    Allergies  Allergen Reactions   Ciprofloxacin Other (See Comments)    REACTION: nausea, sweating, chest tightness   Atorvastatin Other (See Comments)    Myalgia    Crestor [Rosuvastatin Calcium] Nausea Only   Nexlizet [Bempedoic Acid-Ezetimibe] Other (See Comments)    myalgia   Livalo [Pitavastatin] Other (See Comments)    myalgia   Praluent [Alirocumab] Other (See Comments)    Muscle aches    ROS    See HPI Objective:    Physical Exam Constitutional:       General: She is not in acute distress.    Appearance: Normal appearance. She is well-developed.  HENT:  Head: Normocephalic and atraumatic.     Right Ear: Tympanic membrane, ear canal and external ear normal.     Left Ear: Tympanic membrane, ear canal and external ear normal.     Nose:     Right Sinus: Frontal sinus tenderness present. No maxillary sinus tenderness.     Left Sinus: Frontal sinus tenderness present. No maxillary sinus tenderness.     Mouth/Throat:     Mouth: Mucous membranes are moist.     Pharynx: Oropharynx is clear. No posterior oropharyngeal erythema.     Tonsils: No tonsillar exudate.  Eyes:     General: No scleral icterus. Neck:     Thyroid: No thyromegaly.  Cardiovascular:     Rate and Rhythm: Normal rate and regular rhythm.     Heart sounds: Normal heart sounds. No murmur heard. Pulmonary:     Effort: Pulmonary effort is normal. No respiratory distress.     Breath sounds: Normal breath sounds. No wheezing.  Musculoskeletal:     Cervical back: Neck supple.  Skin:    General: Skin is warm and dry.  Neurological:     Mental Status: She is alert and oriented to person, place, and time.  Psychiatric:        Mood and Affect: Mood normal.        Behavior: Behavior normal.        Thought Content: Thought content normal.        Judgment: Judgment normal.     BP 139/76   Pulse 66   Temp 98.7 F (37.1 C) (Oral)   Resp 16   Wt 222 lb (100.7 kg)   SpO2 99%   BMI 34.77 kg/m  Wt Readings from Last 3 Encounters:  11/07/22 222 lb (100.7 kg)  10/24/22 221 lb (100.2 kg)  10/16/22 220 lb (99.8 kg)       Assessment & Plan:   Problem List Items Addressed This Visit       Unprioritized   Sinusitis - Primary    New.  Recommended cefdinir, prednisone taper, flonase and claritin. She will let me know if her symptoms are not improved in 1 week. Would proceed with CT sinus at that time.      Relevant Medications   cefdinir (OMNICEF) 300 MG capsule    predniSONE (DELTASONE) 10 MG tablet   fluticasone (FLONASE) 50 MCG/ACT nasal spray   loratadine (CLARITIN) 10 MG tablet    I have discontinued Tahj A. Scherer's amoxicillin-clavulanate and fluconazole. I am also having her start on cefdinir, predniSONE, fluticasone, and loratadine. Additionally, I am having her maintain her aspirin EC, nitroGLYCERIN, sodium chloride, amLODipine, benzonatate, and guaiFENesin.  Meds ordered this encounter  Medications   cefdinir (OMNICEF) 300 MG capsule    Sig: Take 1 capsule (300 mg total) by mouth 2 (two) times daily.    Dispense:  20 capsule    Refill:  0    Order Specific Question:   Supervising Provider    Answer:   Penni Homans A [[id number]]   predniSONE (DELTASONE) 10 MG tablet    Sig: Take 4 tablets by mouth daily for 2 days, then 3 tablets daily for 2 days, then 2 tablets daily for 2 days, and then 1 tablet daily for 2 days.    Dispense:  20 tablet    Refill:  0    Order Specific Question:   Supervising Provider    Answer:   Penni Homans A [[id number]]   fluticasone (FLONASE) 50 MCG/ACT nasal  spray    Sig: Place 2 sprays into both nostrils daily.    Dispense:  16 g    Refill:  6    Order Specific Question:   Supervising Provider    Answer:   Penni Homans A [[id number]]   loratadine (CLARITIN) 10 MG tablet    Sig: Take 1 tablet (10 mg total) by mouth daily.    Dispense:  100 tablet    Refill:  11    Order Specific Question:   Supervising Provider    Answer:   Penni Homans A [[id number]]

## 2022-11-21 ENCOUNTER — Ambulatory Visit (INDEPENDENT_AMBULATORY_CARE_PROVIDER_SITE_OTHER): Payer: Commercial Managed Care - HMO | Admitting: Family

## 2022-11-21 VITALS — BP 129/59 | HR 74 | Temp 97.7°F | Resp 16 | Wt 222.0 lb

## 2022-11-21 DIAGNOSIS — H6993 Unspecified Eustachian tube disorder, bilateral: Secondary | ICD-10-CM

## 2022-11-21 DIAGNOSIS — I2510 Atherosclerotic heart disease of native coronary artery without angina pectoris: Secondary | ICD-10-CM | POA: Diagnosis not present

## 2022-11-21 DIAGNOSIS — I10 Essential (primary) hypertension: Secondary | ICD-10-CM

## 2022-11-21 DIAGNOSIS — E039 Hypothyroidism, unspecified: Secondary | ICD-10-CM | POA: Diagnosis not present

## 2022-11-21 DIAGNOSIS — J309 Allergic rhinitis, unspecified: Secondary | ICD-10-CM

## 2022-11-21 NOTE — Patient Instructions (Signed)
Restart flonase and claritin.  Let me know if your symptoms worsen or if symptoms fail to improve.

## 2022-11-21 NOTE — Assessment & Plan Note (Signed)
Uncontrolled. Restart claritin/flonase. If symptoms fail to improve, recommended that we do some sinus imaging. She will let me know.

## 2022-11-21 NOTE — Assessment & Plan Note (Signed)
Lab Results  Component Value Date   TSH 4.08 07/26/2021   Has been normal last few years. Clinically stable.

## 2022-11-21 NOTE — Assessment & Plan Note (Signed)
Followed by cardiology. On aspirin 88m. Intolerant to statins/repatha unfortunately.

## 2022-11-21 NOTE — Progress Notes (Signed)
Subjective:     Patient ID: Kaitlyn Bowman, female    DOB: 01-Feb-1961, 62 y.o.   MRN: [id number]  Chief Complaint  Patient presents with   Hypertension    Here for follow up   Hypothyroidism    Here for follow     Hypertension   Patient is in today for follow up.  HTN- maintained on amlodipine 52m.  BP Readings from Last 3 Encounters:  11/21/22 (!) 129/59  11/07/22 139/76  10/25/22 (!) 140/75   Reports hx of nasal congestion/ear pain, crusting in ear canal. Overall improving but not completely resolved. Has some facial pressure and also had some issues with her denture that she plans to seen her dentist for. She took claritin for a few days and it seemed to help but it stopped.   Reports she wakes up in the am with ears aching.    Health Maintenance Due  Topic Date Due   DTaP/Tdap/Td (2 - Td or Tdap) 02/20/2022    Past Medical History:  Diagnosis Date   Abnormal TSH    Alcohol abuse    Borderline diabetes    [place]ocaine [place]    Remote crack cocaine use. UDS negative for cocaine 01/2012   Coronary artery disease     inferior STEMI 01/13/12 s/p PTCA/DES to mid RCA. Initial EF 45% by cath but improved to 55-60% by echo 01/14/12   Dyslipidemia    Trig 201, HDL 36, LDL 197 01/2012   GERD (gastroesophageal reflux disease)    History of MI (myocardial infarction) 01/2012   Hypothyroidism    Myocardial infarction ([facility] 20000000  Umbilical hernia     Past Surgical History:  Procedure Laterality Date   ABDOMINAL HYSTERECTOMY  1981   partial   CARDIAC CATHETERIZATION  2010, 2013   Negative. 2013--100% blockage   HERNIA REPAIR  2017   abd hernia    LEFT HEART CATHETERIZATION WITH CORONARY ANGIOGRAM N/A 01/12/2012   Procedure: LEFT HEART CATHETERIZATION WITH CORONARY ANGIOGRAM;  Surgeon: CBurnell Blanks MD;  [facility];  Service: Cardiovascular;  Laterality: N/A;    Family History  Problem Relation Age of Onset   Heart attack Mother 551      prior  pacemaker   Heart disease Mother    Diabetes Mother    Heart failure Father        CHF   Heart disease Father    Alcoholism Father    Stroke Brother     Social History   Socioeconomic History   Marital status: Single    Spouse name: Not on file   Number of children: 1   Years of education: Not on file   Highest education level: GED or equivalent  Occupational History   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 [employer]  Tobacco Use   Smoking status: Former    Years: 33.00    Types: Cigarettes    Quit date: 06/13/2008    Years since quitting: 14.4   Smokeless tobacco: Never   Tobacco comments:    None since 2009   Substance and Sexual Activity   Alcohol use: No    Comment: former   Drug use: No    Types: "Crack" cocaine    Comment: Crack cocaine; none since Jan 2006    Sexual activity: Not on file  Other Topics Concern   Not on file  Social History Narrative   Regular exercise:  Walks 5-7 x weekly  Caffeine use:  1 daily   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   Enjoys reading, sewing, Fill in puzzles, walking   Completed GED.   No pets.               Social Determinants of Health   Financial Resource Strain: Low Risk  (02/21/2021)   Overall Financial Resource Strain (CARDIA)    Difficulty of Paying Living Expenses: Not very hard  Food Insecurity: No Food Insecurity (02/21/2021)   Hunger Vital Sign    Worried About Running Out of Food in the Last Year: Never true    Ran Out of Food in the Last Year: Never true  Transportation Needs: No Transportation Needs (02/21/2021)   PRAPARE - Hydrologist (Medical): No    Lack of Transportation (Non-Medical): No  Physical Activity: Insufficiently Active (02/21/2021)   Exercise Vital Sign    Days of Exercise per Week: 4 days    Minutes of Exercise per Session: 20 min  Stress: No Stress Concern Present (02/21/2021)   [place]    Feeling of Stress : Not at all  Social Connections: Moderately Isolated (02/21/2021)   Social Connection and Isolation Panel [NHANES]    Frequency of Communication with Friends and Family: More than three times a week    Frequency of Social Gatherings with Friends and Family: Twice a week    Attends Religious Services: More than 4 times per year    Active Member of [employer] or Organizations: No    Attends Music therapist: Not on file    Marital Status: Never married  Intimate Partner Violence: Not on file    Outpatient Medications Prior to Visit  Medication Sig Dispense Refill   amLODipine (NORVASC) 5 MG tablet Take 1 tablet (5 mg total) by mouth daily. 90 tablet 3   aspirin EC 81 MG tablet Take 1 tablet (81 mg total) by mouth daily. 90 tablet 3   loratadine (CLARITIN) 10 MG tablet Take 1 tablet (10 mg total) by mouth daily. 100 tablet 11   nitroGLYCERIN (NITROSTAT) 0.4 MG SL tablet Place 1 tablet (0.4 mg total) under the tongue every 5 (five) minutes x 3 doses as needed for chest pain. 25 tablet 6   benzonatate (TESSALON) 100 MG capsule Take 1 capsule (100 mg total) by mouth every 8 (eight) hours. 21 capsule 0   cefdinir (OMNICEF) 300 MG capsule Take 1 capsule (300 mg total) by mouth 2 (two) times daily. 20 capsule 0   fluticasone (FLONASE) 50 MCG/ACT nasal spray Place 2 sprays into both nostrils daily. 16 g 6   guaiFENesin 200 MG tablet Take 1 tablet (200 mg total) by mouth every 6 (six) hours as needed for cough or to loosen phlegm. 30 tablet 0   predniSONE (DELTASONE) 10 MG tablet Take 4 tablets by mouth daily for 2 days, then 3 tablets daily for 2 days, then 2 tablets daily for 2 days, and then 1 tablet daily for 2 days. 20 tablet 0   sodium chloride (OCEAN) 0.65 % SOLN nasal spray Place 1 spray into both nostrils as needed for congestion. 15 mL 0   No facility-administered medications prior to visit.     Allergies  Allergen Reactions   Ciprofloxacin Other (See Comments)    REACTION: nausea, sweating, chest tightness  Atorvastatin Other (See Comments)    Myalgia    Crestor [Rosuvastatin Calcium] Nausea Only   Nexlizet [Bempedoic Acid-Ezetimibe] Other (See Comments)    myalgia   Livalo [Pitavastatin] Other (See Comments)    myalgia   Praluent [Alirocumab] Other (See Comments)    Muscle aches    ROS     Objective:    Physical Exam Constitutional:      General: She is not in acute distress.    Appearance: Normal appearance. She is well-developed.  HENT:     Head: Normocephalic and atraumatic.     Comments: Some dry crusted skin bilateral external portion of ear canal.     Right Ear: Tympanic membrane and external ear normal.     Left Ear: Tympanic membrane and external ear normal.  Eyes:     General: No scleral icterus. Neck:     Thyroid: No thyromegaly.  Cardiovascular:     Rate and Rhythm: Normal rate and regular rhythm.     Heart sounds: Normal heart sounds. No murmur heard. Pulmonary:     Effort: Pulmonary effort is normal. No respiratory distress.     Breath sounds: Normal breath sounds. No wheezing.  Musculoskeletal:     Cervical back: Neck supple.  Skin:    General: Skin is warm and dry.  Neurological:     Mental Status: She is alert and oriented to person, place, and time.  Psychiatric:        Mood and Affect: Mood normal.        Behavior: Behavior normal.        Thought Content: Thought content normal.        Judgment: Judgment normal.     BP (!) 129/59 (BP Location: Left Arm, Patient Position: Sitting, Cuff Size: Large)   Pulse 74   Temp 97.7 F (36.5 C) (Oral)   Resp 16   Wt 222 lb (100.7 kg)   SpO2 99%   BMI 34.77 kg/m  Wt Readings from Last 3 Encounters:  11/21/22 222 lb (100.7 kg)  11/07/22 222 lb (100.7 kg)  10/24/22 221 lb (100.2 kg)       Assessment & Plan:   Problem List Items Addressed This Visit       Unprioritized    Hypothyroidism    Lab Results  Component Value Date   TSH 4.08 07/26/2021  Has been normal last few years. Clinically stable.       Essential hypertension    Stable on amlodipine. Continue same.       Dysfunction of both eustachian tubes    Trial of claritin and flonase.       CAD (coronary artery disease)    Followed by cardiology. On aspirin 45m. Intolerant to statins/repatha unfortunately.       Allergic rhinitis - Primary    Uncontrolled. Restart claritin/flonase. If symptoms fail to improve, recommended that we do some sinus imaging. She will let me know.        I have discontinued Allyne A. Ernest's sodium chloride, benzonatate, guaiFENesin, cefdinir, predniSONE, and fluticasone. I am also having her maintain her aspirin EC, nitroGLYCERIN, amLODipine, and loratadine.  No orders of the defined types were placed in this encounter.

## 2022-11-21 NOTE — Assessment & Plan Note (Signed)
Stable on amlodipine. Continue same.

## 2022-11-21 NOTE — Assessment & Plan Note (Signed)
Trial of claritin and flonase.

## 2022-11-24 ENCOUNTER — Telehealth: Payer: Self-pay | Admitting: Family

## 2022-11-24 NOTE — Telephone Encounter (Signed)
Records review by provider, last mammo 01/31/22 normal and patient to follow up in one year. Ok per provider, patient notified.

## 2022-11-24 NOTE — Telephone Encounter (Signed)
It looks like she never completed the mammogram I ordered last winter at the [facility]. Please give pt the number to call to schedule.

## 2023-01-22 ENCOUNTER — Encounter: Payer: Self-pay | Admitting: *Deleted

## 2023-01-23 ENCOUNTER — Ambulatory Visit (INDEPENDENT_AMBULATORY_CARE_PROVIDER_SITE_OTHER): Payer: 59 | Admitting: Family

## 2023-01-23 VITALS — BP 142/72 | HR 66 | Temp 97.7°F | Resp 16 | Wt 221.0 lb

## 2023-01-23 DIAGNOSIS — Z8719 Personal history of other diseases of the digestive system: Secondary | ICD-10-CM | POA: Diagnosis not present

## 2023-01-23 DIAGNOSIS — I10 Essential (primary) hypertension: Secondary | ICD-10-CM

## 2023-01-23 DIAGNOSIS — M25562 Pain in left knee: Secondary | ICD-10-CM

## 2023-01-23 DIAGNOSIS — Z9889 Other specified postprocedural states: Secondary | ICD-10-CM | POA: Diagnosis not present

## 2023-01-23 NOTE — Assessment & Plan Note (Signed)
New.  Refer to sports med.

## 2023-01-23 NOTE — Assessment & Plan Note (Signed)
New. Refer to Sports med.

## 2023-01-23 NOTE — Progress Notes (Signed)
Subjective:   By signing my name below, I, Kaitlyn Bowman, attest that this documentation has been prepared under the direction and in the presence of Kaitlyn Craze, NP.  01/23/2023.   Patient ID: Kaitlyn Bowman, female    DOB: Jan 27, 1961, 62 y.o.   MRN: [id number]  Chief Complaint  Patient presents with   Knee Pain    Patient complains of bilateral knee pain    HPI Patient is in today for an office visit.  Knee pain:  She complains of bilateral knee pain ongoing for a couple weeks. However, last night she nearly fell while walking downstairs. It felt like her left knee gave way; she believes this is related to a lipoma near her left knee. This event has exacerbated her knee pain. She is requesting a referral to orthopedics.   Hypertension:  Her blood pressure is initially elevated to 156/69. On manual recheck of her left arm, her blood pressure is 142/72. Of note, she endorses feeling nervous while on her way to the office today.  BP Readings from Last 3 Encounters:  01/23/23 (!) 142/72  11/21/22 (!) 129/59  11/07/22 139/76   Prior hernia:  She asked if further testing was required regarding her past umbilical hernia surgery in 2017. At this time she denies any related abdominal pain or bulging.  Past Medical History:  Diagnosis Date   Abnormal TSH    Alcohol abuse    Borderline diabetes    Cocaine abuse (HCC)    Remote crack cocaine use. UDS negative for cocaine 01/2012   Coronary artery disease     inferior STEMI 01/13/12 s/p PTCA/DES to mid RCA. Initial EF 45% by cath but improved to 55-60% by echo 01/14/12   Dyslipidemia    Trig 201, HDL 36, LDL 197 01/2012   GERD (gastroesophageal reflux disease)    History of MI (myocardial infarction) 01/2012   Hypothyroidism    Myocardial infarction [facility]) 2012   Umbilical hernia     Past Surgical History:  Procedure Laterality Date   ABDOMINAL HYSTERECTOMY  1981   partial   CARDIAC CATHETERIZATION  2010, 2013   Negative.  2013--100% blockage   HERNIA REPAIR  2017   abd hernia    LEFT HEART CATHETERIZATION WITH CORONARY ANGIOGRAM N/A 01/12/2012   Procedure: LEFT HEART CATHETERIZATION WITH CORONARY ANGIOGRAM;  Surgeon: Kathleene Hazel, MD;  Location: [facility] C[facility] LAB;  Service: Cardiovascular;  Laterality: N/A;    Family History  Problem Relation Age of Onset   Heart attack Mother 3       prior pacemaker   Heart disease Mother    Diabetes Mother    Heart failure Father        CHF   Heart disease Father    Alcoholism Father    Stroke Brother     Social History   Socioeconomic History   Marital status: Single    Spouse name: Not on file   Number of children: 1   Years of education: Not on file   Highest education level: GED or equivalent  Occupational History   Occupation: Event organiser: [employer]  Tobacco Use   Smoking status: Former    Years: 33    Types: Cigarettes    Quit date: 06/13/2008    Years since quitting: 14.6   Smokeless tobacco: Never   Tobacco comments:    None since 2009   Substance and Sexual Activity   Alcohol use: No  Comment: former   Drug use: No    Types: "Crack" cocaine    Comment: Crack cocaine; none since Jan 2006    Sexual activity: Not on file  Other Topics Concern   Not on file  Social History Narrative   Regular exercise:  Walks 5-7 x weekly   Caffeine use:  1 daily   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   Enjoys reading, sewing, Fill in puzzles, walking   Completed GED.   No pets.               Social Determinants of Health   Financial Resource Strain: Low Risk  (02/21/2021)   Overall Financial Resource Strain (CARDIA)    Difficulty of Paying Living Expenses: Not very hard  Food Insecurity: No Food Insecurity (02/21/2021)   Hunger Vital Sign    Worried About Running Out of Food in the Last Year: Never true    Ran Out of Food in the Last Year: Never true   Transportation Needs: No Transportation Needs (02/21/2021)   PRAPARE - Administrator, Civil Service (Medical): No    Lack of Transportation (Non-Medical): No  Physical Activity: Insufficiently Active (02/21/2021)   Exercise Vital Sign    Days of Exercise per Week: 4 days    Minutes of Exercise per Session: 20 min  Stress: No Stress Concern Present (02/21/2021)   [employer] of Occupational Health - Occupational Stress Questionnaire    Feeling of Stress : Not at all  Social Connections: Moderately Isolated (02/21/2021)   Social Connection and Isolation Panel [NHANES]    Frequency of Communication with Friends and Family: More than three times a week    Frequency of Social Gatherings with Friends and Family: Twice a week    Attends Religious Services: More than 4 times per year    Active Member of [employer] or Organizations: No    Attends Engineer, structural: Not on file    Marital Status: Never married  Intimate Partner Violence: Not on file    Outpatient Medications Prior to Visit  Medication Sig Dispense Refill   amLODipine (NORVASC) 5 MG tablet Take 1 tablet (5 mg total) by mouth daily. 90 tablet 3   aspirin EC 81 MG tablet Take 1 tablet (81 mg total) by mouth daily. 90 tablet 3   loratadine (CLARITIN) 10 MG tablet Take 1 tablet (10 mg total) by mouth daily. 100 tablet 11   nitroGLYCERIN (NITROSTAT) 0.4 MG SL tablet Place 1 tablet (0.4 mg total) under the tongue every 5 (five) minutes x 3 doses as needed for chest pain. 25 tablet 6   No facility-administered medications prior to visit.    Allergies  Allergen Reactions   Ciprofloxacin Other (See Comments)    REACTION: nausea, sweating, chest tightness   Atorvastatin Other (See Comments)    Myalgia    Crestor [Rosuvastatin Calcium] Nausea Only   Nexlizet [Bempedoic Acid-Ezetimibe] Other (See Comments)    myalgia   Livalo [Pitavastatin] Other (See Comments)    myalgia   Praluent [Alirocumab] Other (See  Comments)    Muscle aches    Review of Systems  Musculoskeletal:  Positive for joint pain (Bilateral knees).  See HPI.     Objective:    Physical Exam Constitutional:      Appearance: Normal appearance.  HENT:     Head: Normocephalic and atraumatic.  Right Ear: Tympanic membrane, ear canal and external ear normal.     Left Ear: Tympanic membrane, ear canal and external ear normal.  Eyes:     Extraocular Movements: Extraocular movements intact.     Pupils: Pupils are equal, round, and reactive to light.  Cardiovascular:     Rate and Rhythm: Normal rate and regular rhythm.     Heart sounds: Normal heart sounds. No murmur heard.    No gallop.     Comments: Mild swelling medially of left ankle. Pulmonary:     Effort: Pulmonary effort is normal. No respiratory distress.     Breath sounds: Normal breath sounds. No wheezing or rales.  Musculoskeletal:     Left lower leg: Edema present.     Comments: Lipoma palpated of left lateral leg above knee.  Skin:    General: Skin is warm and dry.  Neurological:     General: No focal deficit present.     Mental Status: She is alert and oriented to person, place, and time.  Psychiatric:        Mood and Affect: Mood normal.        Behavior: Behavior normal.     BP (!) 142/72   Pulse 66   Temp 97.7 F (36.5 C) (Oral)   Resp 16   Wt 221 lb (100.2 kg)   SpO2 99%   BMI 34.61 kg/m  Wt Readings from Last 3 Encounters:  01/23/23 221 lb (100.2 kg)  11/21/22 222 lb (100.7 kg)  11/07/22 222 lb (100.7 kg)       Assessment & Plan:   Problem List Items Addressed This Visit       Unprioritized   History of umbilical hernia repair    Denies recurrent abdominal pain or bulging. She is advised to let me know if these symptoms occur.       Essential hypertension    BP Readings from Last 3 Encounters:  01/23/23 (!) 142/72  11/21/22 (!) 129/59  11/07/22 139/76  BP mildly elevated today- will monitor for now. It looked very good  last check and she is feeling nervous today.       Acute pain of left knee - Primary    New. Refer to Sports med.       Relevant Orders   Ambulatory referral to Sports Medicine     No orders of the defined types were placed in this encounter.   I, Lemont Fillers, NP, personally preformed the services described in this documentation.  All medical record entries made by the scribe were at my direction and in my presence.  I have reviewed the chart and discharge instructions (if applicable) and agree that the record reflects my personal performance and is accurate and complete. 01/23/2023.  I,Mathew Stumpf,acting as a Neurosurgeon for [employer], NP.,have documented all relevant documentation on the behalf of Lemont Fillers, NP,as directed by  Lemont Fillers, NP while in the presence of Lemont Fillers, NP.   Lemont Fillers, NP

## 2023-01-23 NOTE — Assessment & Plan Note (Addendum)
>>  ASSESSMENT AND PLAN FOR LEFT LEG PAIN WRITTEN ON 01/23/2023  2:37 PM BY Sandford Craze, NP  New.  Will refer to sports medicine for further evaluation.   >>ASSESSMENT AND PLAN FOR ACUTE PAIN OF LEFT KNEE WRITTEN ON 01/23/2023  2:49 PM BY O'SULLIVAN, Morris Markham, NP  New.  Refer to sports med.

## 2023-01-23 NOTE — Assessment & Plan Note (Signed)
Denies recurrent abdominal pain or bulging. She is advised to let me know if these symptoms occur.

## 2023-01-23 NOTE — Assessment & Plan Note (Signed)
BP Readings from Last 3 Encounters:  01/23/23 (!) 142/72  11/21/22 (!) 129/59  11/07/22 139/76   BP mildly elevated today- will monitor for now. It looked very good last check and she is feeling nervous today.

## 2023-01-29 ENCOUNTER — Ambulatory Visit: Payer: 59 | Admitting: Family Medicine

## 2023-02-05 ENCOUNTER — Ambulatory Visit (INDEPENDENT_AMBULATORY_CARE_PROVIDER_SITE_OTHER): Payer: 59 | Admitting: Family Medicine

## 2023-02-05 VITALS — BP 138/70 | Ht 66.0 in | Wt 222.0 lb

## 2023-02-05 DIAGNOSIS — M170 Bilateral primary osteoarthritis of knee: Secondary | ICD-10-CM | POA: Diagnosis not present

## 2023-02-05 MED ORDER — METHYLPREDNISOLONE ACETATE 40 MG/ML IJ SUSP
40.0000 mg | Freq: Once | INTRAMUSCULAR | Status: AC
  Administered 2023-02-05: 40 mg via INTRAMUSCULAR

## 2023-02-05 NOTE — Progress Notes (Signed)
  Kaitlyn Bowman - 62 y.o. female MRN [id number]  Date of birth: [birth year]  SUBJECTIVE:  Including CC & ROS.  No chief complaint on file.   Kaitlyn Bowman is a 62 y.o. female that is presenting with acute on chronic bilateral knee pain.  The pain is occurring over the lateral aspect.  Denies any injury inciting event.  She notices the pain when she goes up and down stairs and if she wears heels.    Review of Systems See HPI   HISTORY: Past Medical, Surgical, Social, and Family History Reviewed & Updated per EMR.   Pertinent Historical Findings include:  Past Medical History:  Diagnosis Date   Abnormal TSH    Alcohol abuse    Borderline diabetes    Cocaine abuse (HCC)    Remote crack cocaine use. UDS negative for cocaine 01/2012   Coronary artery disease     inferior STEMI 01/13/12 s/p PTCA/DES to mid RCA. Initial EF 45% by cath but improved to 55-60% by echo 01/14/12   Dyslipidemia    Trig 201, HDL 36, LDL 197 01/2012   GERD (gastroesophageal reflux disease)    History of MI (myocardial infarction) 01/2012   Hypothyroidism    Myocardial infarction [facility]) 2012   Umbilical hernia     Past Surgical History:  Procedure Laterality Date   ABDOMINAL HYSTERECTOMY  1981   partial   CARDIAC CATHETERIZATION  2010, 2013   Negative. 2013--100% blockage   HERNIA REPAIR  2017   abd hernia    LEFT HEART CATHETERIZATION WITH CORONARY ANGIOGRAM N/A 01/12/2012   Procedure: LEFT HEART CATHETERIZATION WITH CORONARY ANGIOGRAM;  Surgeon: Kathleene Hazel, MD;  Location: [facility] C[facility] LAB;  Service: Cardiovascular;  Laterality: N/A;     PHYSICAL EXAM:  VS: BP 138/70   Ht 5\' 6"  (1.676 m)   Wt 222 lb (100.7 kg)   BMI 35.83 kg/m  Physical Exam Gen: NAD, alert, cooperative with exam, well-appearing MSK:  Neurovascularly intact       ASSESSMENT & PLAN:   OA (osteoarthritis) of knee Acute on chronic in nature.  Exacerbation of underlying degenerative changes and no injury. -Counseled on home  exercise therapy and supportive care. -IM Depo-Medrol. -Referral to physical therapy. -Could consider injection or further imaging

## 2023-02-05 NOTE — Patient Instructions (Signed)
Good to see you Please use ice as needed  Please try the exercises  We have made a referral to physical therapy   Please send me a message in MyChart with any questions or updates.  Please see me back in 4 weeks or as needed if better.   --Dr. Jordan Likes

## 2023-02-05 NOTE — Assessment & Plan Note (Signed)
Acute on chronic in nature.  Exacerbation of underlying degenerative changes and no injury. -Counseled on home exercise therapy and supportive care. -IM Depo-Medrol. -Referral to physical therapy. -Could consider injection or further imaging

## 2023-02-08 ENCOUNTER — Telehealth: Payer: Self-pay | Admitting: Cardiovascular Disease

## 2023-02-08 NOTE — Telephone Encounter (Signed)
Called patient back. Patient states she has been having chest tightness and pressure for the past week. She states it comes and goes, and seems to improve with walking or with taking her bra off. Patient states bra is an underwire bra but it does not have any wires poking out. She states the discomfort seems to be in the middle of her chest. She denies SOB and states she has not taken any Nitro for it. She states she has been able to attend work everyday, drive and go to grocery store. She also notes she started taking a new multivitamin a week ago that contains Beet, Magnesium, Zinc, Calcium, B-12, B-6, B-1. I offered patient a DOD appt on Monday 02/12/23, but patient states she would be more comfortable going to be seen at ED today. Advised patient that she should do that if she is concerned and feels the discomfort needs to be addressed as soon as possible. Advised patient to call 911 if she felt like chest pressure/tightness was getting worse and was not alleviated with nitro or sitting down to rest. Patient verbalized understanding.

## 2023-02-08 NOTE — Telephone Encounter (Signed)
Attempted phone call to pt and left voicemail message to contact triage at [phone]. 

## 2023-02-08 NOTE — Telephone Encounter (Signed)
Pt c/o of Chest Pain: STAT if CP now or developed within 24 hours  1. Are you having CP right now? No - Chest tightness only when she takes bra off.   2. Are you experiencing any other symptoms (ex. SOB, nausea, vomiting, sweating)? No   3. How long have you been experiencing CP? A week   4. Is your CP continuous or coming and going? Coming and going. Mostly when she takes her bra off.   5. Have you taken Nitroglycerin? No   Patient believes this may be due to medications she is taking. Please advise.  ?

## 2023-02-08 NOTE — Telephone Encounter (Signed)
Patient is returning call. Transferred to triage.

## 2023-04-17 ENCOUNTER — Ambulatory Visit (INDEPENDENT_AMBULATORY_CARE_PROVIDER_SITE_OTHER): Payer: 59 | Admitting: Family

## 2023-04-17 VITALS — BP 139/69 | HR 68 | Temp 97.6°F | Resp 18 | Ht 66.0 in | Wt 220.4 lb

## 2023-04-17 DIAGNOSIS — E782 Mixed hyperlipidemia: Secondary | ICD-10-CM

## 2023-04-17 DIAGNOSIS — J302 Other seasonal allergic rhinitis: Secondary | ICD-10-CM

## 2023-04-17 DIAGNOSIS — Z23 Encounter for immunization: Secondary | ICD-10-CM | POA: Diagnosis not present

## 2023-04-17 DIAGNOSIS — E039 Hypothyroidism, unspecified: Secondary | ICD-10-CM

## 2023-04-17 NOTE — Assessment & Plan Note (Signed)
Stable on claritin, continue same.  

## 2023-04-17 NOTE — Assessment & Plan Note (Addendum)
Lab Results  Component Value Date   CHOL 225 (H) 07/26/2021   HDL 44.30 07/26/2021   LDLCALC 88 01/11/2021   LDLDIRECT 130.0 07/26/2021   TRIG 246.0 (H) 07/26/2021   CHOLHDL 5 07/26/2021   Intolerant to statins or injectable (PCSK9).  Continue lowfat diet, exercise.

## 2023-04-17 NOTE — Assessment & Plan Note (Addendum)
Lab Results  Component Value Date   TSH 4.08 07/26/2021   Clinically stable without synthroid, update TSH.

## 2023-04-17 NOTE — Progress Notes (Signed)
Subjective:     Patient ID: Kaitlyn Bowman, female    DOB: 26-Jul-1961, 62 y.o.   MRN: [id number]  Chief Complaint  Patient presents with   6 month follow up     Concerns/ questions: pt is feeling dizzy today upon check in.  Tdap due     HPI  Discussed the use of AI scribe software for clinical note transcription with the patient, who gave verbal consent to proceed.  History of Present Illness   The patient, with a history of hypertension and thyroid disease, presents for a regular follow up. She reports feeling unwell, experiencing lightheadedness and heat intolerance, which she attributes to the weather. She reports that drinking water helped alleviate these symptoms. She also mentions having allergies, which she manages with prescription Claritin and over-the-counter Flonase as needed. She has not been using the Flonase recently as her allergies have not been bothering her. She also reports dental issues and expresses a desire to have her teeth pulled, but she is struggling with insurance coverage for dental care. She has not had any recent chest pain.          There are no preventive care reminders to display for this patient.   Past Medical History:  Diagnosis Date   Abnormal TSH    Alcohol abuse    Borderline diabetes    Cocaine abuse (HCC)    Remote crack cocaine use. UDS negative for cocaine 01/2012   Coronary artery disease     inferior STEMI 01/13/12 s/p PTCA/DES to mid RCA. Initial EF 45% by cath but improved to 55-60% by echo 01/14/12   Dyslipidemia    Trig 201, HDL 36, LDL 197 01/2012   GERD (gastroesophageal reflux disease)    History of MI (myocardial infarction) 01/2012   Hypothyroidism    Myocardial infarction [facility] 2012   Umbilical hernia     Past Surgical History:  Procedure Laterality Date   ABDOMINAL HYSTERECTOMY  1981   partial   CARDIAC CATHETERIZATION  2010, 2013   Negative. 2013--100% blockage   HERNIA REPAIR  2017   abd hernia    LEFT HEART  CATHETERIZATION WITH CORONARY ANGIOGRAM N/A 01/12/2012   Procedure: LEFT HEART CATHETERIZATION WITH CORONARY ANGIOGRAM;  Surgeon: Kathleene Hazel, MD;  Location: [facility] CATH LAB;  Service: Cardiovascular;  Laterality: N/A;    Family History  Problem Relation Age of Onset   Heart attack Mother 39       prior pacemaker   Heart disease Mother    Diabetes Mother    Heart failure Father        CHF   Heart disease Father    Alcoholism Father    Stroke Brother     Social History   Socioeconomic History   Marital status: Single    Spouse name: Not on file   Number of children: 1   Years of education: Not on file   Highest education level: GED or equivalent  Occupational History   Occupation: Event organiser: [employer]  Tobacco Use   Smoking status: Former    Years: 33    Types: Cigarettes    Quit date: 06/13/2008    Years since quitting: 14.8   Smokeless tobacco: Never   Tobacco comments:    None since 2009   Substance and Sexual Activity   Alcohol use: No    Comment: former   Drug use: No    Types: "Crack" cocaine    Comment:  Crack cocaine; none since Jan 2006    Sexual activity: Not on file  Other Topics Concern   Not on file  Social History Narrative   Regular exercise:  Walks 5-7 x weekly   Caffeine use:  1 daily   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   Enjoys reading, sewing, Fill in puzzles, walking   Completed GED.   No pets.               Social Determinants of Health   Financial Resource Strain: Low Risk  (02/21/2021)   Overall Financial Resource Strain (CARDIA)    Difficulty of Paying Living Expenses: Not very hard  Food Insecurity: No Food Insecurity (02/21/2021)   Hunger Vital Sign    Worried About Running Out of Food in the Last Year: Never true    Ran Out of Food in the Last Year: Never true  Transportation Needs: No Transportation Needs (02/21/2021)   PRAPARE -  Administrator, Civil Service (Medical): No    Lack of Transportation (Non-Medical): No  Physical Activity: Insufficiently Active (02/21/2021)   Exercise Vital Sign    Days of Exercise per Week: 4 days    Minutes of Exercise per Session: 20 min  Stress: No Stress Concern Present (02/21/2021)   [employer] of Occupational Health - Occupational Stress Questionnaire    Feeling of Stress : Not at all  Social Connections: Moderately Isolated (02/21/2021)   Social Connection and Isolation Panel [NHANES]    Frequency of Communication with Friends and Family: More than three times a week    Frequency of Social Gatherings with Friends and Family: Twice a week    Attends Religious Services: More than 4 times per year    Active Member of [employer] or Organizations: No    Attends Engineer, structural: Not on file    Marital Status: Never married  Intimate Partner Violence: Not on file    Outpatient Medications Prior to Visit  Medication Sig Dispense Refill   amLODipine (NORVASC) 5 MG tablet Take 1 tablet (5 mg total) by mouth daily. 90 tablet 3   aspirin EC 81 MG tablet Take 1 tablet (81 mg total) by mouth daily. 90 tablet 3   nitroGLYCERIN (NITROSTAT) 0.4 MG SL tablet Place 1 tablet (0.4 mg total) under the tongue every 5 (five) minutes x 3 doses as needed for chest pain. 25 tablet 6   loratadine (CLARITIN) 10 MG tablet Take 1 tablet (10 mg total) by mouth daily. 100 tablet 11   No facility-administered medications prior to visit.    Allergies  Allergen Reactions   Ciprofloxacin Other (See Comments)    REACTION: nausea, sweating, chest tightness   Atorvastatin Other (See Comments)    Myalgia    Crestor [Rosuvastatin Calcium] Nausea Only   Nexlizet [Bempedoic Acid-Ezetimibe] Other (See Comments)    myalgia   Livalo [Pitavastatin] Other (See Comments)    myalgia   Praluent [Alirocumab] Other (See Comments)    Muscle aches    ROS    See HPI Objective:     Physical Exam Constitutional:      General: She is not in acute distress.    Appearance: Normal appearance. She is well-developed.  HENT:     Head: Normocephalic and atraumatic.     Right Ear: External ear normal.     Left Ear: External ear  normal.  Eyes:     General: No scleral icterus. Neck:     Thyroid: No thyromegaly.  Cardiovascular:     Rate and Rhythm: Normal rate and regular rhythm.     Heart sounds: Normal heart sounds. No murmur heard. Pulmonary:     Effort: Pulmonary effort is normal. No respiratory distress.     Breath sounds: Normal breath sounds. No wheezing.  Musculoskeletal:     Cervical back: Neck supple.  Skin:    General: Skin is warm and dry.  Neurological:     Mental Status: She is alert and oriented to person, place, and time.  Psychiatric:        Mood and Affect: Mood normal.        Behavior: Behavior normal.        Thought Content: Thought content normal.        Judgment: Judgment normal.      BP 139/69 (BP Location: Left Arm, Patient Position: Sitting, Cuff Size: Large)   Pulse 68   Temp 97.6 F (36.4 C) (Oral)   Resp 18   Ht 5\' 6"  (1.676 m)   Wt 220 lb 6.4 oz (100 kg)   SpO2 98%   BMI 35.57 kg/m  Wt Readings from Last 3 Encounters:  04/17/23 220 lb 6.4 oz (100 kg)  02/05/23 222 lb (100.7 kg)  01/23/23 221 lb (100.2 kg)       Assessment & Plan:   Problem List Items Addressed This Visit       Unprioritized   Seasonal allergies    Stable on claritin, continue same.       Hypothyroidism - Primary    Lab Results  Component Value Date   TSH 4.08 07/26/2021  Clinically stable without synthroid, update TSH.        Relevant Orders   TSH   Hyperlipidemia    Lab Results  Component Value Date   CHOL 225 (H) 07/26/2021   HDL 44.30 07/26/2021   LDLCALC 88 01/11/2021   LDLDIRECT 130.0 07/26/2021   TRIG 246.0 (H) 07/26/2021   CHOLHDL 5 07/26/2021  Intolerant to statins or injectable (PCSK9).  Continue lowfat diet, exercise.        Relevant Orders   Comp Met (CMET)   Other Visit Diagnoses     Need for Tdap vaccination       Relevant Orders   Tdap vaccine greater than or equal to 7yo IM (Completed)       I have discontinued Pavielle A. Rodenberg's loratadine. I am also having her maintain her aspirin EC, nitroGLYCERIN, and amLODipine.  No orders of the defined types were placed in this encounter.

## 2023-04-18 LAB — COMPREHENSIVE METABOLIC PANEL
ALT: 19 U/L (ref 0–35)
AST: 19 U/L (ref 0–37)
Albumin: 4.5 g/dL (ref 3.5–5.2)
Alkaline Phosphatase: 80 U/L (ref 39–117)
BUN: 9 mg/dL (ref 6–23)
CO2: 25 mEq/L (ref 19–32)
Calcium: 10 mg/dL (ref 8.4–10.5)
Chloride: 104 mEq/L (ref 96–112)
Creatinine, Ser: 0.76 mg/dL (ref 0.40–1.20)
GFR: 83.83 mL/min (ref >60.00)
Glucose, Bld: 81 mg/dL (ref 70–99)
Potassium: 4.2 mEq/L (ref 3.5–5.1)
Sodium: 139 mEq/L (ref 135–145)
Total Bilirubin: 0.3 mg/dL (ref 0.2–1.2)
Total Protein: 7.6 g/dL (ref 6.0–8.3)

## 2023-04-18 LAB — TSH: TSH: 3.7 u[IU]/mL (ref 0.35–5.50)

## 2023-05-28 ENCOUNTER — Other Ambulatory Visit: Payer: Self-pay

## 2023-05-28 ENCOUNTER — Emergency Department (HOSPITAL_BASED_OUTPATIENT_CLINIC_OR_DEPARTMENT_OTHER)
Admission: EM | Admit: 2023-05-28 | Discharge: 2023-05-28 | Disposition: A | Discharge status: Home / Self Care | Payer: 59 | Attending: Emergency Medicine | Admitting: Emergency Medicine

## 2023-05-28 ENCOUNTER — Encounter (HOSPITAL_BASED_OUTPATIENT_CLINIC_OR_DEPARTMENT_OTHER): Payer: Self-pay | Admitting: Pediatrics

## 2023-05-28 ENCOUNTER — Telehealth: Payer: Self-pay

## 2023-05-28 ENCOUNTER — Emergency Department (HOSPITAL_BASED_OUTPATIENT_CLINIC_OR_DEPARTMENT_OTHER): Payer: 59

## 2023-05-28 DIAGNOSIS — X58XXXA Exposure to other specified factors, initial encounter: Secondary | ICD-10-CM | POA: Diagnosis not present

## 2023-05-28 DIAGNOSIS — I2510 Atherosclerotic heart disease of native coronary artery without angina pectoris: Secondary | ICD-10-CM | POA: Diagnosis not present

## 2023-05-28 DIAGNOSIS — S3993XA Unspecified injury of pelvis, initial encounter: Secondary | ICD-10-CM | POA: Diagnosis not present

## 2023-05-28 DIAGNOSIS — S3991XA Unspecified injury of abdomen, initial encounter: Secondary | ICD-10-CM | POA: Diagnosis not present

## 2023-05-28 DIAGNOSIS — S299XXA Unspecified injury of thorax, initial encounter: Secondary | ICD-10-CM | POA: Diagnosis not present

## 2023-05-28 DIAGNOSIS — Z90710 Acquired absence of both cervix and uterus: Secondary | ICD-10-CM | POA: Diagnosis not present

## 2023-05-28 DIAGNOSIS — R9431 Abnormal electrocardiogram [ECG] [EKG]: Secondary | ICD-10-CM | POA: Diagnosis not present

## 2023-05-28 LAB — CBC
HCT: 40.1 % (ref 36.0–46.0)
Hemoglobin: 12.8 g/dL (ref 12.0–15.0)
MCH: 25.7 pg — ABNORMAL LOW (ref 26.0–34.0)
MCHC: 31.9 g/dL (ref 30.0–36.0)
MCV: 80.4 fL (ref 80.0–100.0)
Platelets: 329 10*3/uL (ref 150–400)
RBC: 4.99 MIL/uL (ref 3.87–5.11)
RDW: 15.6 % — ABNORMAL HIGH (ref 11.5–15.5)
WBC: 9.3 10*3/uL (ref 4.0–10.5)
nRBC: 0 % (ref 0.0–0.2)

## 2023-05-28 LAB — URINALYSIS, ROUTINE W REFLEX MICROSCOPIC
Bilirubin Urine: NEGATIVE
Glucose, UA: NEGATIVE mg/dL
Hgb urine dipstick: NEGATIVE
Ketones, ur: NEGATIVE mg/dL
Leukocytes,Ua: NEGATIVE
Nitrite: NEGATIVE
Protein, ur: NEGATIVE mg/dL
Specific Gravity, Urine: 1.015 (ref 1.005–1.030)
pH: 5.5 (ref 5.0–8.0)

## 2023-05-28 LAB — COMPREHENSIVE METABOLIC PANEL
ALT: 31 U/L (ref 0–44)
AST: 26 U/L (ref 15–41)
Albumin: 4.1 g/dL (ref 3.5–5.0)
Alkaline Phosphatase: 76 U/L (ref 38–126)
Anion gap: 9 (ref 5–15)
BUN: 10 mg/dL (ref 8–23)
CO2: 24 mmol/L (ref 22–32)
Calcium: 9.3 mg/dL (ref 8.9–10.3)
Chloride: 103 mmol/L (ref 98–111)
Creatinine, Ser: 0.78 mg/dL (ref 0.44–1.00)
GFR, Estimated: >60 mL/min (ref >60)
Glucose, Bld: 81 mg/dL (ref 70–99)
Potassium: 3.7 mmol/L (ref 3.5–5.1)
Sodium: 136 mmol/L (ref 135–145)
Total Bilirubin: 0.4 mg/dL (ref 0.3–1.2)
Total Protein: 8.3 g/dL — ABNORMAL HIGH (ref 6.5–8.1)

## 2023-05-28 LAB — LIPASE, BLOOD: Lipase: 28 U/L (ref 11–51)

## 2023-05-28 MED ORDER — NAPROXEN 500 MG PO TABS
500.0000 mg | ORAL_TABLET | Freq: Two times a day (BID) | ORAL | 0 refills | Status: DC
  Filled 2023-05-29: qty 30, 15d supply, fill #0

## 2023-05-28 MED ORDER — MORPHINE SULFATE (PF) 4 MG/ML IV SOLN
4.0000 mg | Freq: Once | INTRAVENOUS | Status: DC
  Filled 2023-05-28: qty 1

## 2023-05-28 MED ORDER — IOHEXOL 300 MG/ML  SOLN
100.0000 mL | Freq: Once | INTRAMUSCULAR | Status: AC | PRN
  Administered 2023-05-28: 100 mL via INTRAVENOUS

## 2023-05-28 MED ORDER — ONDANSETRON HCL 4 MG/2ML IJ SOLN
4.0000 mg | Freq: Once | INTRAMUSCULAR | Status: DC
  Filled 2023-05-28: qty 2

## 2023-05-28 MED ORDER — KETOROLAC TROMETHAMINE 30 MG/ML IJ SOLN
30.0000 mg | Freq: Once | INTRAMUSCULAR | Status: AC
  Administered 2023-05-28: 30 mg via INTRAVENOUS
  Filled 2023-05-28: qty 1

## 2023-05-28 MED ORDER — LIDOCAINE 5 % EX PTCH
1.0000 | MEDICATED_PATCH | CUTANEOUS | 0 refills | Status: DC
  Filled 2023-05-28: qty 30, 30d supply, fill #0

## 2023-05-28 MED ORDER — ACETAMINOPHEN 325 MG PO TABS
650.0000 mg | ORAL_TABLET | Freq: Once | ORAL | Status: AC
  Administered 2023-05-28: 650 mg via ORAL
  Filled 2023-05-28: qty 2

## 2023-05-28 NOTE — ED Provider Notes (Signed)
[facility] Provider Note   CSN: [id number] Arrival date & time: 05/28/23  1323     History  Chief Complaint  Patient presents with   Abdominal Pain    Kaitlyn Bowman is a 62 y.o. female past medical history significant for substance abuse in remission, hyperlipidemia, hernia, CAD here for evaluation of abdominal pain.  She has had diffuse left abdominal pain after coughing a few days ago.  Using OTC meds which have helped her she still has some pain.  Pain worse with movement such as bending, twisting. No pain at rest.  Denies any chest pain, sob.  No numbness, weakness.  Pain worse when she lays on her left side at her abdomen.  No dysuria, hematuria.  No history of kidney stones.   HPI     Home Medications Prior to Admission medications   Medication Sig Start Date End Date Taking? Authorizing Provider  lidocaine (LIDODERM) 5 % Place 1 patch onto the skin daily. Remove & Discard patch within 12 hours or as directed by MD 05/28/23  Yes Kosisochukwu Burningham A, PA-C  naproxen (NAPROSYN) 500 MG tablet Take 1 tablet (500 mg total) by mouth 2 (two) times daily. 05/28/23  Yes Maliaka Brasington A, PA-C  amLODipine (NORVASC) 5 MG tablet Take 1 tablet (5 mg total) by mouth daily. 07/31/22   Kathleene Hazel, MD  aspirin EC 81 MG tablet Take 1 tablet (81 mg total) by mouth daily. 06/16/13   Kathleene Hazel, MD  nitroGLYCERIN (NITROSTAT) 0.4 MG SL tablet Place 1 tablet (0.4 mg total) under the tongue every 5 (five) minutes x 3 doses as needed for chest pain. 01/18/15   Kathleene Hazel, MD      Allergies    Ciprofloxacin, Atorvastatin, Crestor [rosuvastatin calcium], Nexlizet [bempedoic acid-ezetimibe], Livalo [pitavastatin], and Praluent [alirocumab]    Review of Systems   Review of Systems  Constitutional: Negative.   HENT: Negative.    Respiratory: Negative.    Cardiovascular: Negative.   Gastrointestinal:  Positive for  abdominal pain. Negative for abdominal distention, anal bleeding, blood in stool, constipation, diarrhea, nausea, rectal pain and vomiting.  Genitourinary: Negative.   Musculoskeletal: Negative.   Skin: Negative.   Neurological: Negative.   All other systems reviewed and are negative.   Physical Exam Updated Vital Signs BP (!) 149/71 (BP Location: Right Arm)   Pulse 73   Temp 97.6 F (36.4 C) (Oral)   Resp 18   Ht 5\' 6"  (1.676 m)   Wt 101.2 kg   SpO2 99%   BMI 35.99 kg/m  Physical Exam Vitals and nursing note reviewed.  Constitutional:      General: She is not in acute distress.    Appearance: She is well-developed. She is not ill-appearing, toxic-appearing or diaphoretic.  HENT:     Head: Atraumatic.  Eyes:     Pupils: Pupils are equal, round, and reactive to light.  Cardiovascular:     Rate and Rhythm: Normal rate.     Heart sounds: Normal heart sounds.  Pulmonary:     Effort: Pulmonary effort is normal. No respiratory distress.     Breath sounds: Normal breath sounds.     Comments: Clear bilaterally, speaks in full sentences without difficulty Abdominal:     General: Bowel sounds are normal. There is no distension.     Palpations: Abdomen is soft.     Tenderness: There is abdominal tenderness in the left upper quadrant and  left lower quadrant. There is no right CVA tenderness, left CVA tenderness, guarding or rebound. Negative signs include Murphy's sign and McBurney's sign.     Hernia: No hernia is present.       Comments: Tenderness to left mid abdomen and anterior posterior trunk as well as left lateral abdomen.  Worse with twisting movement and bending at the waist  Musculoskeletal:        General: Normal range of motion.     Cervical back: Normal range of motion.     Comments: No midline C/T/L tenderness.  Nontender upper and lower extremities.  Skin:    General: Skin is warm and dry.     Capillary Refill: Capillary refill takes less than 2 seconds.      Comments: No edema, erythema or warmth.  No fluctuance or induration.  No ecchymosis, evidence of trauma.  Neurological:     General: No focal deficit present.     Mental Status: She is alert.     Cranial Nerves: Cranial nerves 2-12 are intact.     Sensory: Sensation is intact.     Motor: Motor function is intact.     Gait: Gait is intact.  Psychiatric:        Mood and Affect: Mood normal.     ED Results / Procedures / Treatments   Labs (all labs ordered are listed, but only abnormal results are displayed) Labs Reviewed  COMPREHENSIVE METABOLIC PANEL - Abnormal; Notable for the following components:      Result Value   Total Protein 8.3 (*)    All other components within normal limits  CBC - Abnormal; Notable for the following components:   MCH 25.7 (*)    RDW 15.6 (*)    All other components within normal limits  LIPASE, BLOOD  URINALYSIS, ROUTINE W REFLEX MICROSCOPIC    EKG EKG Interpretation Date/Time:  Monday May 28 2023 16:57:05 EDT Ventricular Rate:  68 PR Interval:  158 QRS Duration:  90 QT Interval:  385 QTC Calculation: 410 R Axis:   -52  Text Interpretation: Sinus rhythm LAD, consider left anterior fascicular block Abnormal R-wave progression, late transition Borderline T abnormalities, diffuse leads No significant change since last tracing Confirmed by Melene Plan [phone]) on 05/28/2023 6:42:31 PM  Radiology CT CHEST ABDOMEN PELVIS W CONTRAST  Result Date: 05/28/2023 CLINICAL DATA:  One poly trauma. EXAM: CT CHEST, ABDOMEN, AND PELVIS WITH CONTRAST TECHNIQUE: Multidetector CT imaging of the chest, abdomen and pelvis was performed following the standard protocol during bolus administration of intravenous contrast. RADIATION DOSE REDUCTION: This exam was performed according to the departmental dose-optimization program which includes automated exposure control, adjustment of the mA and/or kV according to patient size and/or use of iterative reconstruction  technique. CONTRAST:  OMNIPAQUE IOHEXOL 300 MG/ML  SOLN COMPARISON:  None Available. FINDINGS: CT CHEST FINDINGS Cardiovascular: No contour abnormality of the thoracic aorta. No pericardial fluid. No mediastinal hematoma. Mediastinum/Nodes: Esophagus and trachea normal. Lungs/Pleura: No pleural fluid or pulmonary contusion. No pneumothorax. Musculoskeletal: No thoracic fracture identified.  Chin CT ABDOMEN AND PELVIS FINDINGS Hepatobiliary: No focal hepatic lesion. No biliary ductal dilatation. Gallbladder is normal. Common bile duct is normal. Pancreas: Pancreas is normal. No ductal dilatation. No pancreatic inflammation. Spleen: No splenic laceration Adrenals/urinary tract: Kidneys enhance symmetrically. Ureters bladder intact. Stomach/Bowel: No bowel injury.  No mesenteric injury identified. Vascular/Lymphatic: No aortic injury.  Iliac arteries are normal. Reproductive: Post hysterectomy Other: No free fluid Musculoskeletal: No fracture of the pelvis  or spine IMPRESSION: CHEST: No thoracic injury. PELVIS: 1. No abdominal pelvic injury. 2. No fracture. Electronically Signed   By: Genevive Bi M.D.   On: 05/28/2023 18:37    Procedures Procedures    Medications Ordered in ED Medications  acetaminophen (TYLENOL) tablet 650 mg (650 mg Oral Given 05/28/23 1641)  iohexol (OMNIPAQUE) 300 MG/ML solution 100 mL (100 mLs Intravenous Contrast Given 05/28/23 1729)  ketorolac (TORADOL) 30 MG/ML injection 30 mg (30 mg Intravenous Given 05/28/23 1905)    ED Course/ Medical Decision Making/ A&P   62 year old female here for evaluation of left upper and mid abdominal pain after a cough last week when she had a "tickle in her throat."  No additional URI symptoms since that single cough.  Pain improved however still there.  Worse with movement, bending.  She denies any chest pain, shortness of breath.  No pleurisy.  No numbness or weakness.  She is afebrile, nonseptic, not ill-appearing.  No urinary symptoms,  URI symptoms.  Patient appears otherwise well.  Pain is reproducible on exam over left mid abdomen.  Will plan on labs and imaging and reassess. Patient only wanting Tylenol due to being sober since 2006.  Labs and imaging personally viewed and interpreted:  CBC without leukocytosis lipase 28 UA negative for infection, blood Metabolic panel without significant abnormality EKG without ischemic changes CT chest abdomen pelvis without significant abnormality  Discussed results with patient.  Will DC home with sx management.  Low suspicion for acute traumatic injury, acute ACS, PE, dissection, obstruction, perforation, bacterial infectious process as cause of her symptoms.  The patient has been appropriately medically screened and/or stabilized in the ED. I have low suspicion for any other emergent medical condition which would require further screening, evaluation or treatment in the ED or require inpatient management.  Patient is hemodynamically stable and in no acute distress.  Patient able to ambulate in department prior to ED.  Evaluation does not show acute pathology that would require ongoing or additional emergent interventions while in the emergency department or further inpatient treatment.  I have discussed the diagnosis with the patient and answered all questions.  Pain is been managed while in the emergency department and patient has no further complaints prior to discharge.  Patient is comfortable with plan discussed in room and is stable for discharge at this time.  I have discussed strict return precautions for returning to the emergency department.  Patient was encouraged to follow-up with PCP/specialist refer to at discharge.                                 Medical Decision Making Amount and/or Complexity of Data Reviewed External Data Reviewed: labs, radiology, ECG and notes. Labs: ordered. Decision-making details documented in ED Course. Radiology: ordered and independent  interpretation performed. Decision-making details documented in ED Course. ECG/medicine tests: ordered and independent interpretation performed. Decision-making details documented in ED Course.  Risk OTC drugs. Prescription drug management. Diagnosis or treatment significantly limited by social determinants of health.          Final Clinical Impression(s) / ED Diagnoses Final diagnoses:  Injury of abdominal wall, initial encounter    Rx / DC Orders ED Discharge Orders          Ordered    naproxen (NAPROSYN) 500 MG tablet  2 times daily        05/28/23 1900    lidocaine (LIDODERM) 5 %  Every 24 hours        05/28/23 1900              Kiyara Bouffard A, PA-C 05/28/23 1909    Melene Plan, DO 05/28/23 1909

## 2023-05-28 NOTE — ED Triage Notes (Signed)
C/O left mid-lower abdominal and flank area pain started 3 days ago;

## 2023-05-28 NOTE — ED Notes (Signed)
Patient politely refused Morphine and Zofran. 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 Provider notified.

## 2023-05-28 NOTE — Telephone Encounter (Signed)
Appt scheduled w/ PCP tomorrow.  

## 2023-05-28 NOTE — Discharge Instructions (Signed)
It was a pleasure taking care of you here in the emergency department today  As discussed on your CT scan your labs are reassuring  Take the medications as prescribed  May also use warm compress to your abdomen  Return for new or worsening symptoms.

## 2023-05-28 NOTE — Telephone Encounter (Signed)
Initial Comment Caller states she is lying on left side and began coughing last week. She is now having pain to side. Translation No Nurse Assessment Nurse: Doylene Canard, RN, Lesa Date/Time (Eastern Time): 05/28/2023 11:01:47 AM Confirm and document reason for call. If symptomatic, describe symptoms. ---Caller states she has left flank pain Does the patient have any new or worsening symptoms? ---Yes Will a triage be completed? ---Yes Related visit to physician within the last 2 weeks? ---No Does the PT have any chronic conditions? (i.e. diabetes, asthma, this includes High risk factors for pregnancy, etc.) ---Yes List chronic conditions. ---HTN Is this a behavioral health or substance abuse call? ---No Guidelines Guideline Title Affirmed Question Affirmed Notes Nurse Date/Time Lamount Cohen Time) Flank Pain [1] Abdominal pain AND [2] age > 60 years Doylene Canard, Charity fundraiser, Lesa 05/28/2023 11:03:31 AM Disp. Time Lamount Cohen Time) Disposition Final User 05/28/2023 11:00:17 AM Send to Urgent Queue Pearletha Furl 05/28/2023 11:06:28 AM Go to ED Now Yes Conner, RN, Lesa PLEASE NOTE: All timestamps contained within this report are represented as [place] Standard Time. CONFIDENTIALTY NOTICE: This fax transmission is intended only for the addressee. It contains information that is legally privileged, confidential or otherwise protected from use or disclosure. If you are not the intended recipient, you are strictly prohibited from reviewing, disclosing, copying using or disseminating any of this information or taking any action in reliance on or regarding this information. If you have received this fax in error, please notify us immediately by telephone so that we can arrange for its return to [place]. Phone: [phone], Toll-Free: [phone], Fax: [phone] Page: 2 of 2 Call Id: [id number] Final Disposition 05/28/2023 11:06:28 AM Go to ED Now Yes Doylene Canard, RN, Gean Maidens Caller Disagree/Comply Disagree Caller Understands  Yes PreDisposition Home Care Care Advice Given Per Guideline * You need to be seen in the Emergency Department. GO TO ED NOW: * Go to the ED at ___________ Hospital. * Leave now. Drive carefully. ANOTHER ADULT SHOULD DRIVE: * It is better and safer if another adult drives instead of you. BRING MEDICINES: CARE ADVICE given per Flank Pain (Adult) guideline. Comments User: Ocie Doyne, RN Date/Time Lamount Cohen Time): 05/28/2023 11:06:43 AM May go to ED later Referrals GO TO FACILITY REFUSED

## 2023-05-29 ENCOUNTER — Other Ambulatory Visit (HOSPITAL_BASED_OUTPATIENT_CLINIC_OR_DEPARTMENT_OTHER): Payer: Self-pay

## 2023-05-29 ENCOUNTER — Ambulatory Visit: Payer: 59 | Admitting: Family

## 2023-06-28 ENCOUNTER — Telehealth: Payer: 59 | Admitting: Physician Assistant

## 2023-06-28 DIAGNOSIS — H66002 Acute suppurative otitis media without spontaneous rupture of ear drum, left ear: Secondary | ICD-10-CM

## 2023-06-28 MED ORDER — AZITHROMYCIN 250 MG PO TABS: ORAL_TABLET | ORAL | 0 refills | Status: AC

## 2023-06-28 NOTE — Progress Notes (Signed)
Virtual Visit Consent   Kaitlyn Bowman, you are scheduled for a virtual visit with a [facility] provider today. Just as with appointments in the office, your consent must be obtained to participate. Your consent will be active for this visit and any virtual visit you may have with one of our providers in the next 365 days. If you have a MyChart account, a copy of this consent can be sent to you electronically.  As this is a virtual visit, video technology does not allow for your provider to perform a traditional examination. This may limit your provider's ability to fully assess your condition. If your provider identifies any concerns that need to be evaluated in person or the need to arrange testing (such as labs, EKG, etc.), we will make arrangements to do so. Although advances in technology are sophisticated, we cannot ensure that it will always work on either your end or our end. If the connection with a video visit is poor, the visit may have to be switched to a telephone visit. With either a video or telephone visit, we are not always able to ensure that we have a secure connection.  By engaging in this virtual visit, you consent to the provision of healthcare and authorize for your insurance to be billed (if applicable) for the services provided during this visit. Depending on your insurance coverage, you may receive a charge related to this service.  I need to obtain your verbal consent now. Are you willing to proceed with your visit today? Kaitlyn Bowman has provided verbal consent on 06/28/2023 for a virtual visit (video or telephone). Piedad Climes, [place]  Date: 06/28/2023 2:26 PM  Virtual Visit via Video Note   I, Piedad Climes, connected with  Kaitlyn Bowman  ([id number], 11-23-1960) on 06/28/23 at  2:15 PM EDT by a video-enabled telemedicine application and verified that I am speaking with the correct person using two identifiers.  Location: Patient: Virtual Visit Location  Patient: Home Provider: Virtual Visit Location Provider: Home Office   I discussed the limitations of evaluation and management by telemedicine and the availability of in person appointments. The patient expressed understanding and agreed to proceed.    History of Present Illness: Kaitlyn Bowman is a 62 y.o. who identifies as a female who was assigned female at birth, and is being seen today for eara aches. Bilateral ear pain initially but R ear pain resolved. Still with L ear pain that is now constant and substantial. Denies hearing loss, ear swelling or drainage from ear. Noting some loose stool -- mild without nausea, vomiting, melena. No associated chest congestion or cough. Denies dizziness or fever.   HPI: HPI  Problems:  Patient Active Problem List   Diagnosis Date Noted   Acute pain of left knee 01/23/2023   History of umbilical hernia repair 01/23/2023   Dysfunction of both eustachian tubes 11/21/2022   Allergic rhinitis 11/21/2022   Hyperglycemia 05/01/2022   Bronchitis 03/21/2022   Acute pain of left shoulder 01/31/2022   Mass of axillary tail of right breast 11/29/2021   Flank pain 08/16/2021   Mass of subcutaneous tissue of back 07/26/2021   Dental abscess 02/22/2021   Statin myopathy 10/19/2020   Osteopenia 06/15/2020   Essential hypertension 06/15/2020   OA (osteoarthritis) of knee 02/22/2017   Back pain 10/06/2014   Wrist pain, right 09/30/2014   Grief reaction 07/13/2014   Plantar fasciitis 05/11/2014   Left leg pain 05/11/2014   Sinusitis  05/02/2014   Menopausal syndrome 05/19/2013   Umbilical hernia 05/20/2012   Special screening for malignant neoplasms, colon 04/15/2012   General medical examination 02/21/2012   CAD (coronary artery disease) 01/30/2012   Seasonal allergies 01/24/2012   Inferior MI-status post stenting 4/13 01/14/2012   Hyperlipidemia 01/14/2012   Hypothyroidism 01/14/2012   SUBSTANCE ABUSE 07/06/2010    Allergies:  Allergies  Allergen  Reactions   Ciprofloxacin Other (See Comments)    REACTION: nausea, sweating, chest tightness   Atorvastatin Other (See Comments)    Myalgia    Crestor [Rosuvastatin Calcium] Nausea Only   Nexlizet [Bempedoic Acid-Ezetimibe] Other (See Comments)    myalgia   Livalo [Pitavastatin] Other (See Comments)    myalgia   Praluent [Alirocumab] Other (See Comments)    Muscle aches   Medications:  Current Outpatient Medications:    azithromycin (ZITHROMAX) 250 MG tablet, Take 2 tablets on day 1, then 1 tablet daily on days 2 through 5, Disp: 6 tablet, Rfl: 0   amLODipine (NORVASC) 5 MG tablet, Take 1 tablet (5 mg total) by mouth daily., Disp: 90 tablet, Rfl: 3   aspirin EC 81 MG tablet, Take 1 tablet (81 mg total) by mouth daily., Disp: 90 tablet, Rfl: 3   lidocaine (LIDODERM) 5 %, Place 1 patch onto the skin daily. Remove & Discard patch within 12 hours or as directed by MD., Disp: 30 patch, Rfl: 0   naproxen (NAPROSYN) 500 MG tablet, Take 1 tablet (500 mg total) by mouth 2 (two) times daily., Disp: 30 tablet, Rfl: 0   nitroGLYCERIN (NITROSTAT) 0.4 MG SL tablet, Place 1 tablet (0.4 mg total) under the tongue every 5 (five) minutes x 3 doses as needed for chest pain., Disp: 25 tablet, Rfl: 6  Observations/Objective: Patient is well-developed, well-nourished in no acute distress.  Resting comfortably  at home.  Head is normocephalic, atraumatic.  No labored breathing.  Speech is clear and coherent with logical content.  Patient is alert and oriented at baseline.   Assessment and Plan: 1. Non-recurrent acute suppurative otitis media of left ear without spontaneous rupture of tympanic membrane - azithromycin (ZITHROMAX) 250 MG tablet; Take 2 tablets on day 1, then 1 tablet daily on days 2 through 5  Dispense: 6 tablet; Refill: 0  Supportive measures and OTC medications reviewed. Azithromycin per orders. In person follow-up precautions discussed.  Follow Up Instructions: I discussed the  assessment and treatment plan with the patient. The patient was provided an opportunity to ask questions and all were answered. The patient agreed with the plan and demonstrated an understanding of the instructions.  A copy of instructions were sent to the patient via MyChart unless otherwise noted below.   The patient was advised to call back or seek an in-person evaluation if the symptoms worsen or if the condition fails to improve as anticipated.  Time:  I spent 10 minutes with the patient via telehealth technology discussing the above problems/concerns.    Piedad Climes, PA-C

## 2023-06-28 NOTE — Patient Instructions (Signed)
Kaitlyn Bowman, thank you for joining Piedad Climes, PA-C for today's virtual visit.  While this provider is not your primary care provider (PCP), if your PCP is located in our provider database this encounter information will be shared with them immediately following your visit.   A [facility] MyChart account gives you access to today's visit and all your visits, tests, and labs performed at [facility] " click here if you don't have a [facility] MyChart account or go to [email or web address]  Consent: (Patient) Kaitlyn Bowman provided verbal consent for this virtual visit at the beginning of the encounter.  Current Medications:  Current Outpatient Medications:    amLODipine (NORVASC) 5 MG tablet, Take 1 tablet (5 mg total) by mouth daily., Disp: 90 tablet, Rfl: 3   aspirin EC 81 MG tablet, Take 1 tablet (81 mg total) by mouth daily., Disp: 90 tablet, Rfl: 3   lidocaine (LIDODERM) 5 %, Place 1 patch onto the skin daily. Remove & Discard patch within 12 hours or as directed by MD., Disp: 30 patch, Rfl: 0   naproxen (NAPROSYN) 500 MG tablet, Take 1 tablet (500 mg total) by mouth 2 (two) times daily., Disp: 30 tablet, Rfl: 0   nitroGLYCERIN (NITROSTAT) 0.4 MG SL tablet, Place 1 tablet (0.4 mg total) under the tongue every 5 (five) minutes x 3 doses as needed for chest pain., Disp: 25 tablet, Rfl: 6   Medications ordered in this encounter:  No orders of the defined types were placed in this encounter.    *If you need refills on other medications prior to your next appointment, please contact your pharmacy*  Follow-Up: Call back or seek an in-person evaluation if the symptoms worsen or if the condition fails to improve as anticipated.  [facility] [phone]  Other Instructions Otitis Media, Adult  Otitis media is a condition in which the middle ear is red and swollen (inflamed) and full of fluid. The middle ear is the part of the ear that contains  bones for hearing as well as air that helps send sounds to the brain. The condition usually goes away on its own. What are the causes? This condition is caused by a blockage in the eustachian tube. This tube connects the middle ear to the back of the nose. It normally allows air into the middle ear. The blockage is caused by fluid or swelling. Problems that can cause blockage include: A cold or infection that affects the nose, mouth, or throat. Allergies. An irritant, such as tobacco smoke. Adenoids that have become large. The adenoids are soft tissue located in the back of the throat, behind the nose and the roof of the mouth. Growth or swelling in the upper part of the throat, just behind the nose (nasopharynx). Damage to the ear caused by a change in pressure. This is called barotrauma. What increases the risk? You are more likely to develop this condition if you: Smoke or are exposed to tobacco smoke. Have an opening in the roof of your mouth (cleft palate). Have acid reflux. Have problems in your body's defense system (immune system). What are the signs or symptoms? Symptoms of this condition include: Ear pain. Fever. Problems with hearing. Being tired. Fluid leaking from the ear. Ringing in the ear. How is this treated? This condition can go away on its own within 3-5 days. But if the condition is caused by germs (bacteria) and does not go away on its own, or if it  keeps coming back, your doctor may: Give you antibiotic medicines. Give you medicines for pain. Follow these instructions at home: Take over-the-counter and prescription medicines only as told by your doctor. If you were prescribed an antibiotic medicine, take it as told by your doctor. Do not stop taking it even if you start to feel better. Keep all follow-up visits. Contact a doctor if: You have bleeding from your nose. There is a lump on your neck. You are not feeling better in 5 days. You feel worse instead of  better. Get help right away if: You have pain that is not helped with medicine. You have swelling, redness, or pain around your ear. You get a stiff neck. You cannot move part of your face (paralysis). You notice that the bone behind your ear hurts when you touch it. You get a very bad headache. Summary Otitis media means that the middle ear is red, swollen, and full of fluid. This condition usually goes away on its own. If the problem does not go away, treatment may be needed. You may be given medicines to treat the infection or to treat your pain. If you were prescribed an antibiotic medicine, take it as told by your doctor. Do not stop taking it even if you start to feel better. Keep all follow-up visits. This information is not intended to replace advice given to you by your health care provider. Make sure you discuss any questions you have with your health care provider. Document Revised: 01/03/2021 Document Reviewed: 01/03/2021 Elsevier Patient Education  2024 Elsevier Inc.    If you have been instructed to have an in-person evaluation today at a local Urgent Care facility, please use the link below. It will take you to a list of all of our available [facility], including address, phone number and hours of operation. Please do not delay care.  [facility]  If you or a family member do not have a primary care provider, use the link below to schedule a visit and establish care. When you choose a [facility] primary care physician or advanced practice provider, you gain a long-term partner in health. Find a Primary Care Provider  Learn more about [facility]'s in-office and virtual care options: [facility] - Get Care Now

## 2023-07-23 ENCOUNTER — Ambulatory Visit (INDEPENDENT_AMBULATORY_CARE_PROVIDER_SITE_OTHER): Payer: 59 | Admitting: Family Medicine

## 2023-07-23 ENCOUNTER — Ambulatory Visit
Admission: RE | Admit: 2023-07-23 | Discharge: 2023-07-23 | Disposition: A | Discharge status: Home / Self Care | Payer: 59 | Source: Ambulatory Visit | Attending: Family Medicine | Admitting: Family Medicine

## 2023-07-23 VITALS — BP 131/74 | Ht 66.0 in | Wt 212.0 lb

## 2023-07-23 DIAGNOSIS — M25561 Pain in right knee: Secondary | ICD-10-CM | POA: Diagnosis not present

## 2023-07-23 DIAGNOSIS — M170 Bilateral primary osteoarthritis of knee: Secondary | ICD-10-CM

## 2023-07-23 DIAGNOSIS — M25562 Pain in left knee: Secondary | ICD-10-CM | POA: Diagnosis not present

## 2023-07-23 MED ORDER — KETOROLAC TROMETHAMINE 60 MG/2ML IM SOLN
60.0000 mg | Freq: Once | INTRAMUSCULAR | Status: AC
Start: 2023-07-23 — End: 2023-07-23
  Administered 2023-07-23: 60 mg via INTRAMUSCULAR

## 2023-07-23 MED ORDER — MELOXICAM 15 MG PO TABS: 15.0000 mg | ORAL_TABLET | Freq: Every day | ORAL | 2 refills | Status: DC

## 2023-07-23 MED ORDER — METHYLPREDNISOLONE ACETATE 40 MG/ML IJ SUSP
80.0000 mg | Freq: Once | INTRAMUSCULAR | Status: AC
  Administered 2023-07-23: 80 mg via INTRAMUSCULAR

## 2023-07-23 NOTE — Patient Instructions (Signed)
Get x-rays of your knees in [place] tomorrow. We will call you with results. You were given a shot of a steroid and an anti-inflammatory today. Start meloxicam 15mg  daily with food for pain and inflammation.

## 2023-07-24 ENCOUNTER — Other Ambulatory Visit: Payer: Self-pay | Admitting: Cardiovascular Disease

## 2023-07-24 ENCOUNTER — Encounter: Payer: Self-pay | Admitting: Family Medicine

## 2023-07-24 NOTE — Progress Notes (Signed)
PCP: Sandford Craze, NP  Subjective:   HPI: Patient is a 62 y.o. female here for bilateral knee pain.  Patient reports 1 week ago she was coming up the stairs when she fell directly onto both of her knees. Larey Seat more onto the right than the left. Was not seen for this. Maybe some swelling but no bruising. Pain is anterior in both knees.  Past Medical History:  Diagnosis Date   Abnormal TSH    Alcohol abuse    Borderline diabetes    Cocaine abuse (HCC)    Remote crack cocaine use. UDS negative for cocaine 01/2012   Coronary artery disease     inferior STEMI 01/13/12 s/p PTCA/DES to mid RCA. Initial EF 45% by cath but improved to 55-60% by echo 01/14/12   Dyslipidemia    Trig 201, HDL 36, LDL 197 01/2012   GERD (gastroesophageal reflux disease)    History of MI (myocardial infarction) 01/2012   Hypothyroidism    Myocardial infarction [facility]) 2012   Umbilical hernia     Current Outpatient Medications on File Prior to Visit  Medication Sig Dispense Refill   amLODipine (NORVASC) 5 MG tablet Take 1 tablet (5 mg total) by mouth daily. 90 tablet 3   aspirin EC 81 MG tablet Take 1 tablet (81 mg total) by mouth daily. 90 tablet 3   lidocaine (LIDODERM) 5 % Place 1 patch onto the skin daily. Remove & Discard patch within 12 hours or as directed by MD. 30 patch 0   nitroGLYCERIN (NITROSTAT) 0.4 MG SL tablet Place 1 tablet (0.4 mg total) under the tongue every 5 (five) minutes x 3 doses as needed for chest pain. 25 tablet 6   No current facility-administered medications on file prior to visit.    Past Surgical History:  Procedure Laterality Date   ABDOMINAL HYSTERECTOMY  1981   partial   CARDIAC CATHETERIZATION  2010, 2013   Negative. 2013--100% blockage   HERNIA REPAIR  2017   abd hernia    LEFT HEART CATHETERIZATION WITH CORONARY ANGIOGRAM N/A 01/12/2012   Procedure: LEFT HEART CATHETERIZATION WITH CORONARY ANGIOGRAM;  Surgeon: Kathleene Hazel, MD;  Location: [facility] CATH LAB;   Service: Cardiovascular;  Laterality: N/A;    Allergies  Allergen Reactions   Ciprofloxacin Other (See Comments)    REACTION: nausea, sweating, chest tightness   Atorvastatin Other (See Comments)    Myalgia    Crestor [Rosuvastatin Calcium] Nausea Only   Nexlizet [Bempedoic Acid-Ezetimibe] Other (See Comments)    myalgia   Livalo [Pitavastatin] Other (See Comments)    myalgia   Praluent [Alirocumab] Other (See Comments)    Muscle aches    BP 131/74   Ht 5\' 6"  (1.676 m)   Wt 212 lb (96.2 kg)   BMI 34.22 kg/m       No data to display              No data to display              Objective:  Physical Exam:  Gen: NAD, comfortable in exam room  Bilateral knees: No gross deformity, ecchymoses, effusions. Mild TTP over patella, medial joint line on right.  No tenderness left knee. FROM with normal strength. Negative ant/post drawers. Negative valgus/varus testing. Negative lachman.  Negative mcmurrays, apleys. NV intact distally.   Assessment & Plan:  1. Bilateral knee pain - likely 2/2 contusions.  We will proceed with x-rays of both knees to assess for possible fracture though.  IM toradol and depomedrol given today. Start meloxicam daily.

## 2023-07-30 ENCOUNTER — Ambulatory Visit: Payer: 59 | Attending: Cardiovascular Disease | Admitting: Cardiovascular Disease

## 2023-07-30 ENCOUNTER — Encounter: Payer: Self-pay | Admitting: Cardiovascular Disease

## 2023-07-30 VITALS — BP 140/76 | HR 63 | Ht 66.0 in | Wt 224.4 lb

## 2023-07-30 DIAGNOSIS — I10 Essential (primary) hypertension: Secondary | ICD-10-CM

## 2023-07-30 DIAGNOSIS — E785 Hyperlipidemia, unspecified: Secondary | ICD-10-CM | POA: Diagnosis not present

## 2023-07-30 DIAGNOSIS — I2510 Atherosclerotic heart disease of native coronary artery without angina pectoris: Secondary | ICD-10-CM | POA: Diagnosis not present

## 2023-07-30 MED ORDER — AMLODIPINE BESYLATE 5 MG PO TABS: 5.0000 mg | ORAL_TABLET | Freq: Every day | ORAL | 3 refills | Status: DC

## 2023-07-30 NOTE — Patient Instructions (Signed)

## 2023-07-30 NOTE — Progress Notes (Signed)
Chief Complaint  Patient presents with   Follow-up    CAD   History of Present Illness: 62 yo female with history of CAD and hyperlipidemia here today for cardiac follow up. She had an inferior STEMI in April 2013 and was found to have an occluded mid RCA which was treated with a drug eluting stent. Echo May 2016 with normal LV function. Stress test in 2016 with no ischemia.  She has not tolerated statins or Praluent. Echo November 2023 with LVEF=50%. Normal RV function. Mild AI.   She is here today for follow up. The patient denies any chest pain, dyspnea, palpitations, lower extremity edema, orthopnea, PND, dizziness, near syncope or syncope.   Primary Care Provider: Sandford Craze, NP  Past Medical History:  Diagnosis Date   Abnormal TSH    Alcohol abuse    Borderline diabetes    Cocaine abuse (HCC)    Remote crack cocaine use. UDS negative for cocaine 01/2012   Coronary artery disease     inferior STEMI 01/13/12 s/p PTCA/DES to mid RCA. Initial EF 45% by cath but improved to 55-60% by echo 01/14/12   Dyslipidemia    Trig 201, HDL 36, LDL 197 01/2012   GERD (gastroesophageal reflux disease)    History of MI (myocardial infarction) 01/2012   Hypothyroidism    Myocardial infarction [facility]) 2012   Umbilical hernia     Past Surgical History:  Procedure Laterality Date   ABDOMINAL HYSTERECTOMY  1981   partial   CARDIAC CATHETERIZATION  2010, 2013   Negative. 2013--100% blockage   HERNIA REPAIR  2017   abd hernia    LEFT HEART CATHETERIZATION WITH CORONARY ANGIOGRAM N/A 01/12/2012   Procedure: LEFT HEART CATHETERIZATION WITH CORONARY ANGIOGRAM;  Surgeon: Kathleene Hazel, MD;  Location: [facility] CATH LAB;  Service: Cardiovascular;  Laterality: N/A;    Current Outpatient Medications  Medication Sig Dispense Refill   aspirin EC 81 MG tablet Take 1 tablet (81 mg total) by mouth daily. 90 tablet 3   lidocaine (LIDODERM) 5 % Place 1 patch onto the skin daily. Remove & Discard patch  within 12 hours or as directed by MD. (Patient taking differently: Place 1 patch onto the skin as needed (pain). Remove & Discard patch within 12 hours or as directed by MD.) 30 patch 0   meloxicam (MOBIC) 15 MG tablet Take 1 tablet (15 mg total) by mouth daily. (Patient taking differently: Take 15 mg by mouth as needed for pain.) 30 tablet 2   nitroGLYCERIN (NITROSTAT) 0.4 MG SL tablet Place 1 tablet (0.4 mg total) under the tongue every 5 (five) minutes x 3 doses as needed for chest pain. 25 tablet 6   amLODipine (NORVASC) 5 MG tablet Take 1 tablet (5 mg total) by mouth daily. 90 tablet 3   No current facility-administered medications for this visit.    Allergies  Allergen Reactions   Ciprofloxacin Other (See Comments)    REACTION: nausea, sweating, chest tightness   Atorvastatin Other (See Comments)    Myalgia    Crestor [Rosuvastatin Calcium] Nausea Only   Nexlizet [Bempedoic Acid-Ezetimibe] Other (See Comments)    myalgia   Livalo [Pitavastatin] Other (See Comments)    myalgia   Praluent [Alirocumab] Other (See Comments)    Muscle aches    Social History   Socioeconomic History   Marital status: Single    Spouse name: Not on file   Number of children: 1   Years of education: Not on file  Highest education level: GED or equivalent  Occupational History   Occupation: Event organiser: [employer]  Tobacco Use   Smoking status: Former    Current packs/day: 0.00    Types: Cigarettes    Start date: 06/14/1975    Quit date: 06/13/2008    Years since quitting: 15.1   Smokeless tobacco: Never   Tobacco comments:    None since 2009   Substance and Sexual Activity   Alcohol use: No    Comment: former   Drug use: No    Types: "Crack" cocaine    Comment: Crack cocaine; none since Jan 2006    Sexual activity: Not on file  Other Topics Concern   Not on file  Social History Narrative   Regular exercise:  Walks 5-7 x weekly   Caffeine use:  1 daily   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   Enjoys reading, sewing, Fill in puzzles, walking   Completed GED.   No pets.               Social Determinants of Health   Financial Resource Strain: Low Risk  (02/21/2021)   Overall Financial Resource Strain (CARDIA)    Difficulty of Paying Living Expenses: Not very hard  Food Insecurity: No Food Insecurity (02/21/2021)   Hunger Vital Sign    Worried About Running Out of Food in the Last Year: Never true    Ran Out of Food in the Last Year: Never true  Transportation Needs: No Transportation Needs (02/21/2021)   PRAPARE - Administrator, Civil Service (Medical): No    Lack of Transportation (Non-Medical): No  Physical Activity: Insufficiently Active (02/21/2021)   Exercise Vital Sign    Days of Exercise per Week: 4 days    Minutes of Exercise per Session: 20 min  Stress: No Stress Concern Present (02/21/2021)   [employer] of Occupational Health - Occupational Stress Questionnaire    Feeling of Stress : Not at all  Social Connections: Moderately Isolated (02/21/2021)   Social Connection and Isolation Panel [NHANES]    Frequency of Communication with Friends and Family: More than three times a week    Frequency of Social Gatherings with Friends and Family: Twice a week    Attends Religious Services: More than 4 times per year    Active Member of [employer] or Organizations: No    Attends Engineer, structural: Not on file    Marital Status: Never married  Catering manager Violence: Not on file    Family History  Problem Relation Age of Onset   Heart attack Mother 46       prior pacemaker   Heart disease Mother    Diabetes Mother    Heart failure Father        CHF   Heart disease Father    Alcoholism Father    Stroke Brother     Review of Systems:  As stated in the HPI and otherwise negative.   BP (!) 140/76   Pulse 63   Ht 5\' 6"  (1.676 m)   Wt 101.8 kg    SpO2 98%   BMI 36.22 kg/m   Physical Examination: General: Well developed, well nourished, NAD  HEENT: OP clear, mucus membranes moist  SKIN: warm, dry. No rashes. Neuro: No focal deficits  Musculoskeletal: Muscle strength 5/5 all ext  Psychiatric: Mood and affect normal  Neck: No JVD, no carotid bruits, no thyromegaly, no lymphadenopathy.  Lungs:Clear bilaterally, no wheezes, rhonci, crackles Cardiovascular: Regular rate and rhythm. No murmurs, gallops or rubs. Abdomen:Soft. Bowel sounds present. Non-tender.  Extremities: No lower extremity edema. Pulses are 2 + in the bilateral DP/PT.  Echo November 2023:  1. Left ventricular ejection fraction, by estimation, is 45 to 50%. The  left ventricle has mildly decreased function. Left ventricular diastolic  parameters are consistent with Grade I diastolic dysfunction (impaired  relaxation).   2. Right ventricular systolic function is normal. The right ventricular  size is normal.   3. Left atrial size was mildly dilated.   4. The mitral valve is normal in structure. No evidence of mitral valve  regurgitation. No evidence of mitral stenosis.   5. The aortic valve is normal in structure. Aortic valve regurgitation is  mild. No aortic stenosis is present.   6. The inferior vena cava is normal in size with greater than 50%  respiratory variability, suggesting right atrial pressure of 3 mmHg.   EKG:  EKG is not ordered today. The ekg ordered today demonstrates   Recent Labs: 04/17/2023: TSH 3.70 05/28/2023: ALT 31; BUN 10; Creatinine, Ser 0.78; Hemoglobin 12.8; Platelets 329; Potassium 3.7; Sodium 136   Lipid Panel    Component Value Date/Time   CHOL 225 (H) 07/26/2021 1359   CHOL 304 (H) 08/30/2020 1545   TRIG 246.0 (H) 07/26/2021 1359   HDL 44.30 07/26/2021 1359   HDL 40 08/30/2020 1545   CHOLHDL 5 07/26/2021 1359   VLDL 49.2 (H) 07/26/2021 1359   LDLCALC 88 01/11/2021 1133   LDLCALC 220 (H) 08/30/2020 1545   LDLCALC 187 (H)  06/01/2020 1500   LDLDIRECT 130.0 07/26/2021 1359     Wt Readings from Last 3 Encounters:  07/30/23 101.8 kg  07/23/23 96.2 kg  05/28/23 101.2 kg    Assessment and Plan:   1. CAD without angina: No chest pain suggestive of angina. She does not tolerate statins due to muscle aches or beta blockers due to fatigue. She did not tolerate Praluent. Continue ASA   2. HLD: LDL is not at goal. Limited options for medical therapy. She has not tolerated statins, Tricor or Praluent. She is not willing to try anything else including Nexlizet.   3. HTN: BP is controlled at home. Continue Norvasc.   Labs/ tests ordered today include: No orders of the defined types were placed in this encounter.  Disposition:   F/U with me in 12 months  Signed, Verne Carrow, MD 07/30/2023 2:49 PM    [facility] [address] [place], [place], [place]  [address] Phone: [phone]; Fax: [phone]

## 2023-09-04 ENCOUNTER — Telehealth (INDEPENDENT_AMBULATORY_CARE_PROVIDER_SITE_OTHER): Payer: 59 | Admitting: Family

## 2023-09-04 DIAGNOSIS — H6692 Otitis media, unspecified, left ear: Secondary | ICD-10-CM | POA: Diagnosis not present

## 2023-09-04 MED ORDER — AMOXICILLIN 500 MG PO CAPS: 500.0000 mg | ORAL_CAPSULE | Freq: Three times a day (TID) | ORAL | 0 refills | Status: AC

## 2023-09-04 NOTE — Progress Notes (Signed)
MyChart Video Visit    Virtual Visit via Video Note    Patient location: Home. Patient and provider in visit Provider location: Office  I discussed the limitations of evaluation and management by telemedicine and the availability of in person appointments. The patient expressed understanding and agreed to proceed.  Visit Date: 09/04/2023  Today's healthcare provider: Lemont Fillers, NP     Subjective:    Patient ID: Kaitlyn Bowman, female    DOB: [birth year], 62 y.o.   MRN: [id number]  Chief Complaint  Patient presents with   Ear Pain    Complains of left ear pain, itchy     HPI  Patient reports a three day history of left sided ear pain. She denies associated fever.  Feels like a deep sharp pain in the left ear.    Past Medical History:  Diagnosis Date   Abnormal TSH    Alcohol abuse    Borderline diabetes    Cocaine abuse (HCC)    Remote crack cocaine use. UDS negative for cocaine 01/2012   Coronary artery disease     inferior STEMI 01/13/12 s/p PTCA/DES to mid RCA. Initial EF 45% by cath but improved to 55-60% by echo 01/14/12   Dyslipidemia    Trig 201, HDL 36, LDL 197 01/2012   GERD (gastroesophageal reflux disease)    History of MI (myocardial infarction) 01/2012   Hypothyroidism    Myocardial infarction [facility]) 2012   Umbilical hernia     Past Surgical History:  Procedure Laterality Date   ABDOMINAL HYSTERECTOMY  1981   partial   CARDIAC CATHETERIZATION  2010, 2013   Negative. 2013--100% blockage   HERNIA REPAIR  2017   abd hernia    LEFT HEART CATHETERIZATION WITH CORONARY ANGIOGRAM N/A 01/12/2012   Procedure: LEFT HEART CATHETERIZATION WITH CORONARY ANGIOGRAM;  Surgeon: Kathleene Hazel, MD;  Location: [facility] CATH LAB;  Service: Cardiovascular;  Laterality: N/A;    Family History  Problem Relation Age of Onset   Heart attack Mother 85       prior pacemaker   Heart disease Mother    Diabetes Mother    Heart failure Father        CHF    Heart disease Father    Alcoholism Father    Stroke Brother     Social History   Socioeconomic History   Marital status: Single    Spouse name: Not on file   Number of children: 1   Years of education: Not on file   Highest education level: GED or equivalent  Occupational History   Occupation: Event organiser: [employer]  Tobacco Use   Smoking status: Former    Current packs/day: 0.00    Types: Cigarettes    Start date: 06/14/1975    Quit date: 06/13/2008    Years since quitting: 15.2   Smokeless tobacco: Never   Tobacco comments:    None since 2009   Substance and Sexual Activity   Alcohol use: No    Comment: former   Drug use: No    Types: "Crack" cocaine    Comment: Crack cocaine; none since Jan 2006    Sexual activity: Not on file  Other Topics Concern   Not on file  Social History Narrative   Regular exercise:  Walks 5-7 x weekly   Caffeine use:  1 daily   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   Enjoys reading, sewing, Fill in puzzles, walking   Completed GED.   No pets.               Social Determinants of Health   Financial Resource Strain: Low Risk  (02/21/2021)   Overall Financial Resource Strain (CARDIA)    Difficulty of Paying Living Expenses: Not very hard  Food Insecurity: No Food Insecurity (02/21/2021)   Hunger Vital Sign    Worried About Running Out of Food in the Last Year: Never true    Ran Out of Food in the Last Year: Never true  Transportation Needs: No Transportation Needs (02/21/2021)   PRAPARE - Administrator, Civil Service (Medical): No    Lack of Transportation (Non-Medical): No  Physical Activity: Insufficiently Active (02/21/2021)   Exercise Vital Sign    Days of Exercise per Week: 4 days    Minutes of Exercise per Session: 20 min  Stress: No Stress Concern Present (02/21/2021)   [employer] of Occupational Health - Occupational Stress  Questionnaire    Feeling of Stress : Not at all  Social Connections: Moderately Isolated (02/21/2021)   Social Connection and Isolation Panel [NHANES]    Frequency of Communication with Friends and Family: More than three times a week    Frequency of Social Gatherings with Friends and Family: Twice a week    Attends Religious Services: More than 4 times per year    Active Member of [employer] or Organizations: No    Attends Engineer, structural: Not on file    Marital Status: Never married  Intimate Partner Violence: Not on file    Outpatient Medications Prior to Visit  Medication Sig Dispense Refill   amLODipine (NORVASC) 5 MG tablet Take 1 tablet (5 mg total) by mouth daily. 90 tablet 3   aspirin EC 81 MG tablet Take 1 tablet (81 mg total) by mouth daily. 90 tablet 3   lidocaine (LIDODERM) 5 % Place 1 patch onto the skin daily. Remove & Discard patch within 12 hours or as directed by MD. (Patient taking differently: Place 1 patch onto the skin as needed (pain). Remove & Discard patch within 12 hours or as directed by MD.) 30 patch 0   meloxicam (MOBIC) 15 MG tablet Take 1 tablet (15 mg total) by mouth daily. (Patient taking differently: Take 15 mg by mouth as needed for pain.) 30 tablet 2   nitroGLYCERIN (NITROSTAT) 0.4 MG SL tablet Place 1 tablet (0.4 mg total) under the tongue every 5 (five) minutes x 3 doses as needed for chest pain. 25 tablet 6   No facility-administered medications prior to visit.    Allergies  Allergen Reactions   Ciprofloxacin Other (See Comments)    REACTION: nausea, sweating, chest tightness   Atorvastatin Other (See Comments)    Myalgia    Crestor [Rosuvastatin Calcium] Nausea Only   Nexlizet [Bempedoic Acid-Ezetimibe] Other (See Comments)    myalgia   Livalo [Pitavastatin] Other (See Comments)    myalgia   Praluent [Alirocumab] Other (See Comments)    Muscle aches    ROS    See HPI Objective:    Physical Exam  There were no vitals taken  for this visit. Wt Readings from Last 3 Encounters:  07/30/23 224 lb 6.4 oz (101.8 kg)  07/23/23 212 lb (96.2 kg)  05/28/23 223 lb (101.2 kg)    Gen: Awake, alert, no acute distress Resp:  Breathing is even and non-labored Psych: calm/pleasant demeanor Neuro: Alert and Oriented x 3, + facial symmetry, speech is clear.     Assessment & Plan:   Problem List Items Addressed This Visit       Unprioritized   Left otitis media - Primary    Hx most consistent with OM. Will rx with amoxicillin. Pt is advised to book an in person visit if her symptoms worsen or if her symptoms fail to improve.       Relevant Medications   amoxicillin (AMOXIL) 500 MG capsule    I am having Kaitlyn Bowman start on amoxicillin. I am also having her maintain her aspirin EC, nitroGLYCERIN, lidocaine, meloxicam, and amLODipine.  Meds ordered this encounter  Medications   amoxicillin (AMOXIL) 500 MG capsule    Sig: Take 1 capsule (500 mg total) by mouth 3 (three) times daily for 10 days.    Dispense:  30 capsule    Refill:  0    Order Specific Question:   Supervising Provider    Answer:   Danise Edge A [[id number]]    I discussed the assessment and treatment plan with the patient. The patient was provided an opportunity to ask questions and all were answered. The patient agreed with the plan and demonstrated an understanding of the instructions.   The patient was advised to call back or seek an in-person evaluation if the symptoms worsen or if the condition fails to improve as anticipated.  Lemont Fillers, NP [facility] [phone] (phone) [phone] (fax)  [facility]

## 2023-09-04 NOTE — Assessment & Plan Note (Signed)
Hx most consistent with OM. Will rx with amoxicillin. Pt is advised to book an in person visit if her symptoms worsen or if her symptoms fail to improve.

## 2023-09-10 DIAGNOSIS — H6503 Acute serous otitis media, bilateral: Secondary | ICD-10-CM | POA: Diagnosis not present

## 2023-09-10 DIAGNOSIS — H9203 Otalgia, bilateral: Secondary | ICD-10-CM | POA: Diagnosis not present

## 2023-10-16 ENCOUNTER — Other Ambulatory Visit: Payer: Self-pay | Admitting: Family Medicine

## 2023-10-23 ENCOUNTER — Other Ambulatory Visit (HOSPITAL_BASED_OUTPATIENT_CLINIC_OR_DEPARTMENT_OTHER): Payer: Self-pay

## 2023-10-23 ENCOUNTER — Ambulatory Visit (INDEPENDENT_AMBULATORY_CARE_PROVIDER_SITE_OTHER): Payer: 59 | Admitting: Family

## 2023-10-23 VITALS — BP 138/65 | HR 72 | Resp 16 | Ht 66.0 in | Wt 227.0 lb

## 2023-10-23 DIAGNOSIS — E039 Hypothyroidism, unspecified: Secondary | ICD-10-CM

## 2023-10-23 DIAGNOSIS — J329 Chronic sinusitis, unspecified: Secondary | ICD-10-CM | POA: Diagnosis not present

## 2023-10-23 DIAGNOSIS — R739 Hyperglycemia, unspecified: Secondary | ICD-10-CM

## 2023-10-23 DIAGNOSIS — E782 Mixed hyperlipidemia: Secondary | ICD-10-CM

## 2023-10-23 DIAGNOSIS — I10 Essential (primary) hypertension: Secondary | ICD-10-CM | POA: Diagnosis not present

## 2023-10-23 MED ORDER — AMOXICILLIN-POT CLAVULANATE 875-125 MG PO TABS: 1.0000 | ORAL_TABLET | Freq: Two times a day (BID) | ORAL | 0 refills | Status: DC

## 2023-10-23 MED ORDER — FLUTICASONE PROPIONATE 50 MCG/ACT NA SUSP: 2.0000 | Freq: Every day | NASAL | 6 refills | Status: AC

## 2023-10-23 NOTE — Assessment & Plan Note (Addendum)
 Lab Results  Component Value Date   CHOL 225 (H) 07/26/2021   HDL 44.30 07/26/2021   LDLCALC 88 01/11/2021   LDLDIRECT 130.0 07/26/2021   TRIG 246.0 (H) 07/26/2021   CHOLHDL 5 07/26/2021   Intolerant to statin, repatha  and zetia . Continue low fat/low cholesterol diet.

## 2023-10-23 NOTE — Assessment & Plan Note (Signed)
 New. Recommended augmentin and flonase.

## 2023-10-23 NOTE — Progress Notes (Signed)
 Subjective:     Patient ID: Kaitlyn Bowman, female    DOB: 1961/03/13, 63 y.o.   MRN: [id number]  Chief Complaint  Patient presents with   Hypertension    Here for follow up   Nasal Congestion    Patient complains of having some nasal congestion    HPI  Discussed the use of AI scribe software for clinical note transcription with the patient, who gave verbal consent to proceed.  History of Present Illness   The patient, with a history of hypertension and a heart attack 11 years ago, presents with persistent upper respiratory symptoms for over a month. She describes a dry nose and morning throat discomfort due to sinus drainage, which was initially thick and yellow but has since improved. She also reports intermittent earaches, particularly in the right ear, which have been ongoing for approximately two months. Previous treatment with amoxicillin  provided some relief. She sought care at an urgent care center two months ago due to severe ear pain, where ear irrigation was performed. She has been using Flonase  nasal spray and Claritin  for symptom relief, which she reports as being effective. She also reports using Coricidin and Tylenol  for symptom management. She notes significant frontal and maxillary sinus pressure.        Health Maintenance Due  Topic Date Due   Pneumococcal Vaccine 53-74 Years old (1 of 2 - PCV) Never done   INFLUENZA VACCINE  Never done    Past Medical History:  Diagnosis Date   Abnormal TSH    Alcohol abuse    Borderline diabetes    Cocaine abuse (HCC)    Remote crack cocaine use. UDS negative for cocaine 01/2012   Coronary artery disease     inferior STEMI 01/13/12 s/p PTCA/DES to mid RCA. Initial EF 45% by cath but improved to 55-60% by echo 01/14/12   Dyslipidemia    Trig 201, HDL 36, LDL 197 01/2012   GERD (gastroesophageal reflux disease)    History of MI (myocardial infarction) 01/2012   Hypothyroidism    Myocardial infarction [facility]) 2012   Umbilical  hernia     Past Surgical History:  Procedure Laterality Date   ABDOMINAL HYSTERECTOMY  1981   partial   CARDIAC CATHETERIZATION  2010, 2013   Negative. 2013--100% blockage   HERNIA REPAIR  2017   abd hernia    LEFT HEART CATHETERIZATION WITH CORONARY ANGIOGRAM N/A 01/12/2012   Procedure: LEFT HEART CATHETERIZATION WITH CORONARY ANGIOGRAM;  Surgeon: Lonni JONETTA Cash, MD;  Location: [facility] CATH LAB;  Service: Cardiovascular;  Laterality: N/A;    Family History  Problem Relation Age of Onset   Heart attack Mother 11       prior pacemaker   Heart disease Mother    Diabetes Mother    Heart failure Father        CHF   Heart disease Father    Alcoholism Father    Stroke Brother     Social History   Socioeconomic History   Marital status: Single    Spouse name: Not on file   Number of children: 1   Years of education: Not on file   Highest education level: GED or equivalent  Occupational History   Occupation: Event Organiser: [employer]  Tobacco Use   Smoking status: Former    Current packs/day: 0.00    Types: Cigarettes    Start date: 06/14/1975    Quit date: 06/13/2008    Years since  quitting: 15.3   Smokeless tobacco: Never   Tobacco comments:    None since 2009   Substance and Sexual Activity   Alcohol use: No    Comment: former   Drug use: No    Types: Crack cocaine    Comment: Crack cocaine; none since Jan 2006    Sexual activity: Not on file  Other Topics Concern   Not on file  Social History Narrative   Regular exercise:  Walks 5-7 x weekly   Caffeine use:  1 daily   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   Enjoys reading, sewing, Fill in puzzles, walking   Completed GED.   No pets.               Social Drivers of Corporate Investment Banker Strain: Low Risk  (02/21/2021)   Overall Financial Resource Strain (CARDIA)    Difficulty of Paying Living Expenses: Not very hard   Food Insecurity: No Food Insecurity (02/21/2021)   Hunger Vital Sign    Worried About Running Out of Food in the Last Year: Never true    Ran Out of Food in the Last Year: Never true  Transportation Needs: No Transportation Needs (02/21/2021)   PRAPARE - Administrator, Civil Service (Medical): No    Lack of Transportation (Non-Medical): No  Physical Activity: Insufficiently Active (02/21/2021)   Exercise Vital Sign    Days of Exercise per Week: 4 days    Minutes of Exercise per Session: 20 min  Stress: No Stress Concern Present (02/21/2021)   [employer] of Occupational Health - Occupational Stress Questionnaire    Feeling of Stress : Not at all  Social Connections: Moderately Isolated (02/21/2021)   Social Connection and Isolation Panel [NHANES]    Frequency of Communication with Friends and Family: More than three times a week    Frequency of Social Gatherings with Friends and Family: Twice a week    Attends Religious Services: More than 4 times per year    Active Member of [employer] or Organizations: No    Attends Engineer, Structural: Not on file    Marital Status: Never married  Intimate Partner Violence: Not on file    Outpatient Medications Prior to Visit  Medication Sig Dispense Refill   amLODipine  (NORVASC ) 5 MG tablet Take 1 tablet (5 mg total) by mouth daily. 90 tablet 3   aspirin  EC 81 MG tablet Take 1 tablet (81 mg total) by mouth daily. 90 tablet 3   nitroGLYCERIN  (NITROSTAT ) 0.4 MG SL tablet Place 1 tablet (0.4 mg total) under the tongue every 5 (five) minutes x 3 doses as needed for chest pain. 25 tablet 6   lidocaine  (LIDODERM ) 5 % Place 1 patch onto the skin daily. Remove & Discard patch within 12 hours or as directed by MD. (Patient taking differently: Place 1 patch onto the skin as needed (pain). Remove & Discard patch within 12 hours or as directed by MD.) 30 patch 0   meloxicam  (MOBIC ) 15 MG tablet Take 1 tablet (15 mg total) by mouth daily.  (Patient taking differently: Take 15 mg by mouth as needed for pain.) 30 tablet 2   No facility-administered medications prior to visit.    Allergies  Allergen Reactions   Ciprofloxacin Other (See Comments)    REACTION: nausea, sweating, chest tightness   Atorvastatin  Other (See  Comments)    Myalgia    Crestor  [Rosuvastatin  Calcium ] Nausea Only   Nexlizet  [Bempedoic Acid-Ezetimibe ] Other (See Comments)    myalgia   Livalo  [Pitavastatin ] Other (See Comments)    myalgia   Praluent  [Alirocumab ] Other (See Comments)    Muscle aches    ROS See HPI    Objective:    Physical Exam Constitutional:      General: She is not in acute distress.    Appearance: Normal appearance. She is well-developed.  HENT:     Head: Normocephalic and atraumatic.     Right Ear: Tympanic membrane, ear canal and external ear normal.     Left Ear: Tympanic membrane, ear canal and external ear normal.     Nose: Rhinorrhea present.     Right Turbinates: Enlarged.     Left Turbinates: Enlarged.  Eyes:     General: No scleral icterus. Neck:     Thyroid : No thyromegaly.  Cardiovascular:     Rate and Rhythm: Normal rate and regular rhythm.     Heart sounds: Normal heart sounds. No murmur heard. Pulmonary:     Effort: Pulmonary effort is normal. No respiratory distress.     Breath sounds: Normal breath sounds. No wheezing.  Musculoskeletal:     Cervical back: Neck supple.  Skin:    General: Skin is warm and dry.  Neurological:     Mental Status: She is alert and oriented to person, place, and time.  Psychiatric:        Mood and Affect: Mood normal.        Behavior: Behavior normal.        Thought Content: Thought content normal.        Judgment: Judgment normal.      BP 138/65   Pulse 72   Resp 16   Ht 5' 6 (1.676 m)   Wt 227 lb (103 kg)   SpO2 98%   BMI 36.64 kg/m  Wt Readings from Last 3 Encounters:  10/23/23 227 lb (103 kg)  07/30/23 224 lb 6.4 oz (101.8 kg)  07/23/23 212 lb  (96.2 kg)       Assessment & Plan:   Problem List Items Addressed This Visit       Unprioritized   Sinusitis   New. Recommended augmentin  and flonase .       Relevant Medications   amoxicillin -clavulanate (AUGMENTIN ) 875-125 MG tablet   fluticasone  (FLONASE ) 50 MCG/ACT nasal spray   Hypothyroidism   Not on synthroid . Update TSH.       Relevant Orders   TSH   Hyperlipidemia - Primary   Lab Results  Component Value Date   CHOL 225 (H) 07/26/2021   HDL 44.30 07/26/2021   LDLCALC 88 01/11/2021   LDLDIRECT 130.0 07/26/2021   TRIG 246.0 (H) 07/26/2021   CHOLHDL 5 07/26/2021   Intolerant to statin, repatha  and zetia . Continue low fat/low cholesterol diet.       Relevant Orders   Lipid panel   Comp Met (CMET)   Hyperglycemia   Lab Results  Component Value Date   HGBA1C 6.3 08/01/2022   Update A1C.      Relevant Orders   HgB A1c   Essential hypertension   Maintained on amlodipine  5mg . Initial bp elevated upon arrival but repeat BP looks good. Continue amlodipine .        I have discontinued Aerianna A. Dirosa's lidocaine  and meloxicam . I am also having her start on amoxicillin -clavulanate and fluticasone . Additionally, I am having her  maintain her aspirin  EC, nitroGLYCERIN , and amLODipine .  Meds ordered this encounter  Medications   amoxicillin -clavulanate (AUGMENTIN ) 875-125 MG tablet    Sig: Take 1 tablet by mouth 2 (two) times daily.    Dispense:  14 tablet    Refill:  0    Supervising Provider:   DOMENICA BLACKBIRD A [[id number]]   fluticasone  (FLONASE ) 50 MCG/ACT nasal spray    Sig: Place 2 sprays into both nostrils daily.    Dispense:  16 g    Refill:  6    Supervising Provider:   DOMENICA BLACKBIRD A [[id number]]

## 2023-10-23 NOTE — Assessment & Plan Note (Signed)
 Lab Results  Component Value Date   HGBA1C 6.3 08/01/2022   Update A1C.

## 2023-10-23 NOTE — Assessment & Plan Note (Signed)
 Not on synthroid. Update TSH.

## 2023-10-23 NOTE — Patient Instructions (Signed)
 VISIT SUMMARY:  During today's visit, we discussed your persistent upper respiratory symptoms, including sinus drainage, dry nose, and earaches. We also reviewed your hypertension management.  YOUR PLAN:  -Sinus infection: You have been experiencing symptoms like sinus drainage and pressure as well earaches for over a month. We will start you on a course of Augmentin  to help clear the infection. Continue using Flonase  and Claritin  as needed for symptom relief.  -HYPERTENSION: Your blood pressure was slightly elevated today. Hypertension means high blood pressure, which can increase the risk of heart disease and stroke. You are currently taking Amlodipine  5mg , and we will recheck your blood pressure in 4 months to ensure it is well-controlled.   INSTRUCTIONS:  Please follow up in 4 months or sooner if your symptoms do not improve with the course of Amoxicillin .

## 2023-10-23 NOTE — Assessment & Plan Note (Addendum)
 Maintained on amlodipine 5mg . Initial bp elevated upon arrival but repeat BP looks good. Continue amlodipine.

## 2023-10-24 LAB — COMPREHENSIVE METABOLIC PANEL
ALT: 20 U/L (ref 0–35)
AST: 18 U/L (ref 0–37)
Albumin: 4.3 g/dL (ref 3.5–5.2)
Alkaline Phosphatase: 72 U/L (ref 39–117)
BUN: 10 mg/dL (ref 6–23)
CO2: 26 meq/L (ref 19–32)
Calcium: 9.6 mg/dL (ref 8.4–10.5)
Chloride: 106 meq/L (ref 96–112)
Creatinine, Ser: 0.67 mg/dL (ref 0.40–1.20)
GFR: 93.17 mL/min (ref >60.00)
Glucose, Bld: 77 mg/dL (ref 70–99)
Potassium: 4.2 meq/L (ref 3.5–5.1)
Sodium: 141 meq/L (ref 135–145)
Total Bilirubin: 0.3 mg/dL (ref 0.2–1.2)
Total Protein: 7.2 g/dL (ref 6.0–8.3)

## 2023-10-24 LAB — TSH: TSH: 2.68 u[IU]/mL (ref 0.35–5.50)

## 2023-10-24 LAB — LIPID PANEL
Cholesterol: 286 mg/dL — ABNORMAL HIGH (ref 0–200)
HDL: 40.7 mg/dL (ref >39.00)
LDL Cholesterol: 204 mg/dL — ABNORMAL HIGH (ref 0–99)
NonHDL: 245.18
Total CHOL/HDL Ratio: 7
Triglycerides: 208 mg/dL — ABNORMAL HIGH (ref 0.0–149.0)
VLDL: 41.6 mg/dL — ABNORMAL HIGH (ref 0.0–40.0)

## 2023-10-24 LAB — HEMOGLOBIN A1C: Hgb A1c MFr Bld: 6.4 % (ref 4.6–6.5)

## 2023-11-02 ENCOUNTER — Telehealth: Payer: Self-pay | Admitting: Family

## 2023-11-02 NOTE — Telephone Encounter (Signed)
Notes printed out for patient, lvm for her to be aware this will be at front desk

## 2023-11-02 NOTE — Telephone Encounter (Signed)
Copied from CRM [id number]. Topic: General - Other >> Nov 02, 2023 12:39 PM Tiffany H wrote: Reason for CRM: Patient called to request office visit notes from 09/04/23 and 10/23/23 for insurance. Letter should have date and time listed.   Patient would like letters left at front desk so that she can pick them up on Monday.   Please assist.

## 2023-11-03 ENCOUNTER — Other Ambulatory Visit: Payer: Self-pay

## 2023-11-03 ENCOUNTER — Encounter (HOSPITAL_BASED_OUTPATIENT_CLINIC_OR_DEPARTMENT_OTHER): Payer: Self-pay | Admitting: Emergency Medicine

## 2023-11-03 ENCOUNTER — Emergency Department (HOSPITAL_BASED_OUTPATIENT_CLINIC_OR_DEPARTMENT_OTHER): Payer: Worker's Compensation

## 2023-11-03 ENCOUNTER — Emergency Department (HOSPITAL_BASED_OUTPATIENT_CLINIC_OR_DEPARTMENT_OTHER)
Admission: EM | Admit: 2023-11-03 | Discharge: 2023-11-03 | Disposition: A | Discharge status: Home / Self Care | Payer: Worker's Compensation | Attending: Emergency Medicine | Admitting: Emergency Medicine

## 2023-11-03 DIAGNOSIS — S161XXA Strain of muscle, fascia and tendon at neck level, initial encounter: Secondary | ICD-10-CM | POA: Insufficient documentation

## 2023-11-03 DIAGNOSIS — S0990XA Unspecified injury of head, initial encounter: Secondary | ICD-10-CM | POA: Insufficient documentation

## 2023-11-03 DIAGNOSIS — Z7982 Long term (current) use of aspirin: Secondary | ICD-10-CM | POA: Insufficient documentation

## 2023-11-03 DIAGNOSIS — S199XXA Unspecified injury of neck, initial encounter: Secondary | ICD-10-CM | POA: Diagnosis not present

## 2023-11-03 DIAGNOSIS — W01198A Fall on same level from slipping, tripping and stumbling with subsequent striking against other object, initial encounter: Secondary | ICD-10-CM | POA: Insufficient documentation

## 2023-11-03 DIAGNOSIS — M542 Cervicalgia: Secondary | ICD-10-CM | POA: Diagnosis not present

## 2023-11-03 MED ORDER — KETOROLAC TROMETHAMINE 60 MG/2ML IM SOLN
30.0000 mg | Freq: Once | INTRAMUSCULAR | Status: AC
  Administered 2023-11-03: 30 mg via INTRAMUSCULAR
  Filled 2023-11-03: qty 2

## 2023-11-03 MED ORDER — METHOCARBAMOL 500 MG PO TABS: 500.0000 mg | ORAL_TABLET | Freq: Three times a day (TID) | ORAL | 0 refills | Status: DC | PRN

## 2023-11-03 NOTE — Discharge Instructions (Signed)
As we discussed your CT head and neck were unremarkable.  You likely have neck muscle spasms and strain from the fall  Take Tylenol or Motrin for pain and apply lidocaine patch  I have prescribed Robaxin as needed for muscle spasms  See your doctor for follow-up  Return to ER if you have worse headache or vomiting

## 2023-11-03 NOTE — ED Provider Notes (Signed)
[facility] Provider Note   CSN: [id number] Arrival date & time: 11/03/23  1310     History  Chief Complaint  Patient presents with   Head Injury    Kaitlyn Bowman is a 63 y.o. female here presenting with head injury.  Patient states that she tripped over a rug and hit her head on the door post around 11 AM.  She then had some headache and neck pain.  She states that she may have a brief loss of consciousness.  Denies any vomiting.  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       Home Medications Prior to Admission medications   Medication Sig Start Date End Date Taking? Authorizing Provider  amLODipine (NORVASC) 5 MG tablet Take 1 tablet (5 mg total) by mouth daily. 07/30/23   Kathleene Hazel, MD  amoxicillin-clavulanate (AUGMENTIN) 875-125 MG tablet Take 1 tablet by mouth 2 (two) times daily. 10/23/23   Sandford Craze, NP  aspirin EC 81 MG tablet Take 1 tablet (81 mg total) by mouth daily. 06/16/13   Kathleene Hazel, MD  fluticasone (FLONASE) 50 MCG/ACT nasal spray Place 2 sprays into both nostrils daily. 10/23/23   Sandford Craze, NP  nitroGLYCERIN (NITROSTAT) 0.4 MG SL tablet Place 1 tablet (0.4 mg total) under the tongue every 5 (five) minutes x 3 doses as needed for chest pain. 01/18/15   Kathleene Hazel, MD      Allergies    Ciprofloxacin, Atorvastatin, Crestor [rosuvastatin calcium], Nexlizet [bempedoic acid-ezetimibe], Livalo [pitavastatin], and Praluent [alirocumab]    Review of Systems   Review of Systems  Musculoskeletal:  Positive for neck pain.  All other systems reviewed and are negative.   Physical Exam Updated Vital Signs BP (!) 141/71 (BP Location: Left Arm)   Pulse 66   Temp 98.9 F (37.2 C)   Resp (!) 21   Ht 5\' 6"  (1.676 m)   Wt 103 kg   SpO2 99%   BMI 36.64 kg/m  Physical  Exam Vitals and nursing note reviewed.  Constitutional:      Comments: Slightly anxious  HENT:     Head: Normocephalic.     Comments: Mild tenderness in the frontal scalp but no obvious hematoma    Mouth/Throat:     Mouth: Mucous membranes are moist.  Eyes:     Extraocular Movements: Extraocular movements intact.     Pupils: Pupils are equal, round, and reactive to light.  Neck:     Comments: Patient has no midline tenderness but has bilateral para cervical tenderness.  Patient has normal range of motion of the neck Cardiovascular:     Rate and Rhythm: Normal rate.  Pulmonary:     Effort: Pulmonary effort is normal.     Breath sounds: Normal breath sounds.  Abdominal:     General: Abdomen is flat.     Palpations: Abdomen is soft.  Musculoskeletal:        General: Normal range of motion.  Skin:    General: Skin is warm.     Capillary Refill: Capillary refill takes less than 2 seconds.  Neurological:     General: No focal deficit present.     Mental Status: She is alert.  Psychiatric:        Mood and Affect: Mood normal.     ED Results / Procedures / Treatments  Labs (all labs ordered are listed, but only abnormal results are displayed) Labs Reviewed - No data to display  EKG None  Radiology CT Head Wo Contrast Result Date: 11/03/2023 CLINICAL DATA:  Trauma, fall, head injury EXAM: CT HEAD WITHOUT CONTRAST CT CERVICAL SPINE WITHOUT CONTRAST TECHNIQUE: Multidetector CT imaging of the head and cervical spine was performed following the standard protocol without intravenous contrast. Multiplanar CT image reconstructions of the cervical spine were also generated. RADIATION DOSE REDUCTION: This exam was performed according to the departmental dose-optimization program which includes automated exposure control, adjustment of the mA and/or kV according to patient size and/or use of iterative reconstruction technique. COMPARISON:  None Available. FINDINGS: CT HEAD FINDINGS Brain:  No evidence of acute infarction, hemorrhage, hydrocephalus, extra-axial collection or mass lesion/mass effect. Vascular: No hyperdense vessel or unexpected calcification. Skull: Normal. Negative for fracture or focal lesion. Sinuses/Orbits: No acute finding. Other: None. CT CERVICAL SPINE FINDINGS Alignment: Straightening of the normal cervical lordosis. Skull base and vertebrae: No acute fracture. No primary bone lesion or focal pathologic process. Soft tissues and spinal canal: No prevertebral fluid or swelling. No visible canal hematoma. Disc levels: Disc space height loss and osteophytosis of the lower cervical levels. Upper chest: Negative. Other: None. IMPRESSION: 1. No acute intracranial pathology. 2. No fracture or static subluxation of the cervical spine. 3. Mild cervical disc degenerative disease. Electronically Signed   By: Jearld Lesch M.D.   On: 11/03/2023 14:38   CT Cervical Spine Wo Contrast Result Date: 11/03/2023 CLINICAL DATA:  Trauma, fall, head injury EXAM: CT HEAD WITHOUT CONTRAST CT CERVICAL SPINE WITHOUT CONTRAST TECHNIQUE: Multidetector CT imaging of the head and cervical spine was performed following the standard protocol without intravenous contrast. Multiplanar CT image reconstructions of the cervical spine were also generated. RADIATION DOSE REDUCTION: This exam was performed according to the departmental dose-optimization program which includes automated exposure control, adjustment of the mA and/or kV according to patient size and/or use of iterative reconstruction technique. COMPARISON:  None Available. FINDINGS: CT HEAD FINDINGS Brain: No evidence of acute infarction, hemorrhage, hydrocephalus, extra-axial collection or mass lesion/mass effect. Vascular: No hyperdense vessel or unexpected calcification. Skull: Normal. Negative for fracture or focal lesion. Sinuses/Orbits: No acute finding. Other: None. CT CERVICAL SPINE FINDINGS Alignment: Straightening of the normal cervical  lordosis. Skull base and vertebrae: No acute fracture. No primary bone lesion or focal pathologic process. Soft tissues and spinal canal: No prevertebral fluid or swelling. No visible canal hematoma. Disc levels: Disc space height loss and osteophytosis of the lower cervical levels. Upper chest: Negative. Other: None. IMPRESSION: 1. No acute intracranial pathology. 2. No fracture or static subluxation of the cervical spine. 3. Mild cervical disc degenerative disease. Electronically Signed   By: Jearld Lesch M.D.   On: 11/03/2023 14:38    Procedures Procedures    Medications Ordered in ED Medications  ketorolac (TORADOL) injection 30 mg (has no administration in time range)    ED Course/ Medical Decision Making/ A&P                                 Medical Decision Making Kaitlyn Bowman is a 63 y.o. female here presenting with neck pain and headache after fall.  I reviewed her CT head and cervical spine and and they were unremarkable.  Patient likely has muscle spasms.  Will discharge home with muscle relaxants   Problems Addressed: Injury of head, initial  encounter: acute illness or injury Strain of neck muscle, initial encounter: acute illness or injury  Amount and/or Complexity of Data Reviewed Radiology: ordered and independent interpretation performed. Decision-making details documented in ED Course.  Risk Prescription drug management.    Final Clinical Impression(s) / ED Diagnoses Final diagnoses:  None    Rx / DC Orders ED Discharge Orders     None         Charlynne Pander, MD 11/03/23 1513

## 2023-11-03 NOTE — ED Triage Notes (Signed)
Pt sts she got up out of her chair and fell head first into a wooden door; thinks she was "knocked out for a second"; takes baby ASA; no laceration

## 2023-11-06 ENCOUNTER — Ambulatory Visit (INDEPENDENT_AMBULATORY_CARE_PROVIDER_SITE_OTHER): Payer: 59 | Admitting: Family Medicine

## 2023-11-06 ENCOUNTER — Encounter: Payer: Self-pay | Admitting: Family Medicine

## 2023-11-06 ENCOUNTER — Telehealth: Payer: Self-pay | Admitting: Family

## 2023-11-06 VITALS — BP 134/76 | Ht 66.0 in | Wt 227.0 lb

## 2023-11-06 DIAGNOSIS — M170 Bilateral primary osteoarthritis of knee: Secondary | ICD-10-CM | POA: Diagnosis not present

## 2023-11-06 MED ORDER — KETOROLAC TROMETHAMINE 60 MG/2ML IM SOLN
60.0000 mg | Freq: Once | INTRAMUSCULAR | Status: AC
  Administered 2023-11-06: 60 mg via INTRAMUSCULAR

## 2023-11-06 MED ORDER — METHYLPREDNISOLONE ACETATE 40 MG/ML IJ SUSP
40.0000 mg | Freq: Once | INTRAMUSCULAR | Status: AC
  Administered 2023-11-06: 40 mg via INTRAMUSCULAR

## 2023-11-06 NOTE — Progress Notes (Unsigned)
CHIEF COMPLAINT: No chief complaint on file.  _____________________________________________________________ SUBJECTIVE  HPI  Pt is a 63 y.o. female here for evaluation of Bilateral knee pain  Last seen 07/23/2023 after the week prior falling directly on both of her knees while coming up the stairs, suspected contusions, x-rays were ordered at that time, given IM Toradol and Depo-Medrol at that time, started on meloxicam daily  Ongoing for years Inciting event: no known inciting event Primarily located: around the kneecap Recent trauma 1/25, occurred 11am  Denies impacting/traumatizing her knees during this event Non Radiating, no numbness/tingling Numbness/tingling Exacerbated by:  Walking  Left bothers her going up/down stairs, reporting pressure Therapies tried so far: meloxicam (stated the medication made her feel like she had lockjaw), tylenol (helped), doesn't like ibuprofen (states every time she takes it feels like it gets stuck in her throat)  Works: Equities trader   Bilateral x-rays independently reviewed: -Left knee x-ray demonstrated preserved joint spacing, trace decreased spacing medial knee joint, signs of degenerative changes of the patella, superior patellar enthesopathy, reduced patellofemoral joint spacing, I-S ratio estimated 1.05 -Right knee x-ray demonstrating decreased joint spacing medial knee joint, osteophytosis of the lateral knee joint, superior patella degenerative changes, query patellar hyperostosis, degenerative changes of the patella noted on sunrise view  Medical history includes CAD, dyslipidemia, hypothyroidism, history of myocardial infarction, history of alcohol misuse, remote crack cocaine use ------------------------------------------------------------------------------------------------------ Past Medical History:  Diagnosis Date   Abnormal TSH    Alcohol abuse    Borderline diabetes    Cocaine abuse (HCC)    Remote crack  cocaine use. UDS negative for cocaine 01/2012   Coronary artery disease     inferior STEMI 01/13/12 s/p PTCA/DES to mid RCA. Initial EF 45% by cath but improved to 55-60% by echo 01/14/12   Dyslipidemia    Trig 201, HDL 36, LDL 197 01/2012   GERD (gastroesophageal reflux disease)    History of MI (myocardial infarction) 01/2012   Hypothyroidism    Myocardial infarction [facility]) 2012   Umbilical hernia     Past Surgical History:  Procedure Laterality Date   ABDOMINAL HYSTERECTOMY  1981   partial   CARDIAC CATHETERIZATION  2010, 2013   Negative. 2013--100% blockage   HERNIA REPAIR  2017   abd hernia    LEFT HEART CATHETERIZATION WITH CORONARY ANGIOGRAM N/A 01/12/2012   Procedure: LEFT HEART CATHETERIZATION WITH CORONARY ANGIOGRAM;  Surgeon: Kathleene Hazel, MD;  Location: [facility] CATH LAB;  Service: Cardiovascular;  Laterality: N/A;      Outpatient Encounter Medications as of 11/06/2023  Medication Sig   amLODipine (NORVASC) 5 MG tablet Take 1 tablet (5 mg total) by mouth daily.   amoxicillin-clavulanate (AUGMENTIN) 875-125 MG tablet Take 1 tablet by mouth 2 (two) times daily.   aspirin EC 81 MG tablet Take 1 tablet (81 mg total) by mouth daily.   fluticasone (FLONASE) 50 MCG/ACT nasal spray Place 2 sprays into both nostrils daily.   methocarbamol (ROBAXIN) 500 MG tablet Take 1 tablet (500 mg total) by mouth every 8 (eight) hours as needed for muscle spasms.   nitroGLYCERIN (NITROSTAT) 0.4 MG SL tablet Place 1 tablet (0.4 mg total) under the tongue every 5 (five) minutes x 3 doses as needed for chest pain.   No facility-administered encounter medications on file as of 11/06/2023.    ------------------------------------------------------------------------------------------------------  _____________________________________________________________ OBJECTIVE  PHYSICAL EXAM  There were no vitals filed for this visit. There is no height or weight on file to calculate BMI.  reviewed  General: A+Ox3, no acute distress, well-nourished, appropriate affect CV: pulses 2+ regular, nondiaphoretic, no peripheral edema, cap refill <2sec Lungs: no audible wheezing, non-labored breathing, bilateral chest rise/fall, nontachypneic Skin: warm, well-perfused, non-icteric, no susp lesions or rashes Neuro: *** Sensation intact, muscle tone wnl, no atrophy Psych: no signs of depression or anxiety MSK: ***    *** Knee: No swelling or deformity Neg fluid wave, joint milking ROM Flex {Numbers; 0-100:15068}, Ext {NUMBERS; -10-45 JOINT ROM:10287} NTTP over the quad tendon, medial fem condyle, lat fem condyle, patella, plica, patella tendon, tibial tuberostiy, fibular head, posterior fossa, pes anserine bursa, gerdy's tubercle, medial jt line, lateral jt line Neg anterior and posterior drawer Neg lachman Neg sag sign Negative varus stress Negative valgus stress  B/l joint line, popliteal fossa Patellar grind positive crepitance  Gait {GAIT:23234}  _____________________________________________________________ ASSESSMENT/PLAN There are no diagnoses linked to this encounter.   Not interested in CSI, has had it before and did nto tolerate 40mg  methylpred 60mg  IM toradol Discussed potential side effects of both medications, patient verbalized understanding and would like to proceed with medication as she has had the same therapy before without issue.   Electronically signed by: Burna Forts, MD 11/06/2023 11:57 AM

## 2023-11-06 NOTE — Telephone Encounter (Signed)
Copied from CRM [id number]. Topic: General - Other >> Nov 06, 2023  1:40 PM Kaitlyn Bowman wrote: Reason for CRM: Patient called in stating she Would like 2 office visits notes for 2023 with dates and time listed. Would like someone to give a call once 2023 office notes are printed and ready for pickup

## 2023-11-06 NOTE — Telephone Encounter (Signed)
Records printed out, patient notified to pick up at front desk

## 2023-12-04 ENCOUNTER — Telehealth: Payer: Self-pay | Admitting: Family

## 2023-12-04 NOTE — Telephone Encounter (Signed)
 Patient notified records are at front desk since 11/06/23 when she made the first request

## 2023-12-04 NOTE — Telephone Encounter (Signed)
 Patient Is requesting two letters with the leader head 2023 for insurance purposes patient would like a call back regarding this

## 2023-12-05 ENCOUNTER — Telehealth: Payer: 59 | Admitting: Physician Assistant

## 2023-12-05 DIAGNOSIS — J069 Acute upper respiratory infection, unspecified: Secondary | ICD-10-CM | POA: Diagnosis not present

## 2023-12-05 MED ORDER — BENZONATATE 100 MG PO CAPS: 100.0000 mg | ORAL_CAPSULE | Freq: Three times a day (TID) | ORAL | 0 refills | Status: DC | PRN

## 2023-12-05 MED ORDER — IPRATROPIUM BROMIDE 0.03 % NA SOLN: 2.0000 | Freq: Two times a day (BID) | NASAL | 0 refills | Status: DC

## 2023-12-05 NOTE — Addendum Note (Signed)
 Addended by: Margaretann Loveless on: 12/05/2023 08:23 AM   Modules accepted: Orders

## 2023-12-05 NOTE — Progress Notes (Signed)
 E visit for Flu like symptoms   We are sorry that you are not feeling well.  Here is how we plan to help! Based on what you have shared with me it looks like you may have flu-like symptoms that should be watched but do not seem to indicate anti-viral treatment.  Influenza or "the flu" is   an infection caused by a respiratory virus. The flu virus is highly contagious and persons who did not receive their yearly flu vaccination may "catch" the flu from close contact.  We have anti-viral medications to treat the viruses that cause this infection. They are not a "cure" and only shorten the course of the infection. These prescriptions are most effective when they are given within the first 2 days of "flu" symptoms.   Based upon your symptoms and potential risk factors I recommend that you follow the flu symptoms recommendation that I have listed below.  This is an infection that is most likely caused by a virus. There are no specific treatments other than to help you with the symptoms until the infection runs its course.  We are sorry you are not feeling well.  Here is how we plan to help!  For nasal congestion, you may use an oral decongestants such as Mucinex D or if you have glaucoma or high blood pressure use plain Mucinex.  Saline nasal spray or nasal drops can help and can safely be used as often as needed for congestion. Continue your previously prescribed Flonase nasal spray.  If you do not have a history of heart disease, hypertension, diabetes or thyroid disease, prostate/bladder issues or glaucoma, you may also use Sudafed to treat nasal congestion.  It is highly recommended that you consult with a pharmacist or your primary care physician to ensure this medication is safe for you to take.     If you have a cough, you may use cough suppressants such as Delsym and Robitussin.  If you have glaucoma or high blood pressure, you can also use Coricidin HBP.   For cough I have prescribed for you A  prescription cough medication called Tessalon Perles 100 mg. You may take 1-2 capsules every 8 hours as needed for cough  If you have a sore or scratchy throat, use a saltwater gargle-  to  teaspoon of salt dissolved in a 4-ounce to 8-ounce glass of warm water.  Gargle the solution for approximately 15-30 seconds and then spit.  It is important not to swallow the solution.  You can also use throat lozenges/cough drops and Chloraseptic spray to help with throat pain or discomfort.  Warm or cold liquids can also be helpful in relieving throat pain.  For headache, pain or general discomfort, you can use Ibuprofen or Tylenol as directed.   Some authorities believe that zinc sprays or the use of Echinacea may shorten the course of your symptoms.  HOME CARE Only take medications as instructed by your medical team. Be sure to drink plenty of fluids. Water is fine as well as fruit juices, sodas and electrolyte beverages. You may want to stay away from caffeine or alcohol. If you are nauseated, try taking small sips of liquids. How do you know if you are getting enough fluid? Your urine should be a pale yellow or almost colorless. Get rest. Taking a steamy shower or using a humidifier may help nasal congestion and ease sore throat pain. You can place a towel over your head and breathe in the steam from hot  water coming from a faucet. Using a saline nasal spray works much the same way. Cough drops, hard candies and sore throat lozenges may ease your cough. Avoid close contacts especially the very young and the elderly Cover your mouth if you cough or sneeze Always remember to wash your hands.   ANYONE WHO HAS FLU SYMPTOMS SHOULD: Stay home. The flu is highly contagious and going out or to work exposes others! Be sure to drink plenty of fluids. Water is fine as well as fruit juices, sodas and electrolyte beverages. You may want to stay away from caffeine or alcohol. If you are nauseated, try taking small  sips of liquids. How do you know if you are getting enough fluid? Your urine should be a pale yellow or almost colorless. Get rest. Taking a steamy shower or using a humidifier may help nasal congestion and ease sore throat pain. Using a saline nasal spray works much the same way. Cough drops, hard candies and sore throat lozenges may ease your cough. Line up a caregiver. Have someone check on you regularly.   GET HELP RIGHT AWAY IF: You cannot keep down liquids or your medications. You become short of breath Your fell like you are going to pass out or loose consciousness. Your symptoms persist after you have completed your treatment plan MAKE SURE YOU  Understand these instructions. Will watch your condition. Will get help right away if you are not doing well or get worse.  Your e-visit answers were reviewed by a board certified advanced clinical practitioner to complete your personal care plan.  Depending on the condition, your plan could have included both over the counter or prescription medications.  If there is a problem please reply  once you have received a response from your provider.  Your safety is important to [place].  If you have drug allergies check your prescription carefully.    You can use MyChart to ask questions about today's visit, request a non-urgent call back, or ask for a work or school excuse for 24 hours related to this e-Visit. If it has been greater than 24 hours you will need to follow up with your provider, or enter a new e-Visit to address those concerns.  You will get an e-mail in the next two days asking about your experience.  I hope that your e-visit has been valuable and will speed your recovery. Thank you for using e-visits.   I have spent 5 minutes in review of e-visit questionnaire, review and updating patient chart, medical decision making and response to patient.   Margaretann Loveless, PA-C

## 2024-02-19 ENCOUNTER — Ambulatory Visit (INDEPENDENT_AMBULATORY_CARE_PROVIDER_SITE_OTHER): Payer: 59 | Admitting: Family

## 2024-02-19 ENCOUNTER — Encounter: Payer: Self-pay | Admitting: Family

## 2024-02-19 VITALS — BP 136/65 | HR 87 | Temp 98.4°F | Resp 16 | Ht 66.0 in | Wt 223.0 lb

## 2024-02-19 DIAGNOSIS — F4320 Adjustment disorder, unspecified: Secondary | ICD-10-CM

## 2024-02-19 DIAGNOSIS — Z8639 Personal history of other endocrine, nutritional and metabolic disease: Secondary | ICD-10-CM | POA: Diagnosis not present

## 2024-02-19 DIAGNOSIS — E782 Mixed hyperlipidemia: Secondary | ICD-10-CM | POA: Diagnosis not present

## 2024-02-19 DIAGNOSIS — Z1231 Encounter for screening mammogram for malignant neoplasm of breast: Secondary | ICD-10-CM | POA: Diagnosis not present

## 2024-02-19 DIAGNOSIS — R739 Hyperglycemia, unspecified: Secondary | ICD-10-CM | POA: Diagnosis not present

## 2024-02-19 DIAGNOSIS — I10 Essential (primary) hypertension: Secondary | ICD-10-CM | POA: Diagnosis not present

## 2024-02-19 DIAGNOSIS — K5909 Other constipation: Secondary | ICD-10-CM | POA: Diagnosis not present

## 2024-02-19 NOTE — Progress Notes (Signed)
 Subjective:     Patient ID: Kaitlyn Bowman, female    DOB: 15-Nov-1960, 63 y.o.   MRN: [id number]  Chief Complaint  Patient presents with   Hypertension    Here for follow up    Hypertension    Discussed the use of AI scribe software for clinical note transcription with the patient, who gave verbal consent to proceed.  History of Present Illness  Kaitlyn Bowman is a 63 year old female who presents for follow up.     She experiences constipation with infrequent bowel movements two to three times a week since her hernia surgery eight years ago. Stools are hard and uncomfortable. Cod liver oil provides relief but is slow-acting. She has not used MiraLAX or other laxatives recently and reports associated bloating.  She takes amlodipine  5 mg for hypertension and notices ankle swelling, which she attributes to the medication. Blood pressure readings are 153/65 mmHg initially and 136/65 mmHg upon recheck. She is concerned about the side effects of amlodipine , particularly the swelling.  She manages high cholesterol through diet and exercise after experiencing significant leg pain with Repatha , Zetia , and statins, leading to her discontinuation. She has recently lost two pounds.     Health Maintenance Due  Topic Date Due   Pneumococcal Vaccine 44-66 Years old (1 of 2 - PCV) Never done   MAMMOGRAM  01/11/2024    Past Medical History:  Diagnosis Date   Abnormal TSH    Alcohol abuse    Borderline diabetes    Cocaine abuse (HCC)    Remote crack cocaine use. UDS negative for cocaine 01/2012   Coronary artery disease     inferior STEMI 01/13/12 s/p PTCA/DES to mid RCA. Initial EF 45% by cath but improved to 55-60% by echo 01/14/12   Dyslipidemia    Trig 201, HDL 36, LDL 197 01/2012   GERD (gastroesophageal reflux disease)    History of MI (myocardial infarction) 01/2012   Hypothyroidism    Myocardial infarction [facility]) 2012   Umbilical hernia     Past Surgical History:  Procedure  Laterality Date   ABDOMINAL HYSTERECTOMY  1981   partial   CARDIAC CATHETERIZATION  2010, 2013   Negative. 2013--100% blockage   HERNIA REPAIR  2017   abd hernia    LEFT HEART CATHETERIZATION WITH CORONARY ANGIOGRAM N/A 01/12/2012   Procedure: LEFT HEART CATHETERIZATION WITH CORONARY ANGIOGRAM;  Surgeon: Odie Benne, MD;  Location: [facility] CATH LAB;  Service: Cardiovascular;  Laterality: N/A;    Family History  Problem Relation Age of Onset   Heart attack Mother 64       prior pacemaker   Heart disease Mother    Diabetes Mother    Heart failure Father        CHF   Heart disease Father    Alcoholism Father    Stroke Brother    Diabetes Mellitus II Brother     Social History   Socioeconomic History   Marital status: Single    Spouse name: Not on file   Number of children: 1   Years of education: Not on file   Highest education level: GED or equivalent  Occupational History   Occupation: Event organiser: [employer]  Tobacco Use   Smoking status: Former    Current packs/day: 0.00    Types: Cigarettes    Start date: 06/14/1975    Quit date: 06/13/2008    Years since quitting: 15.6   Smokeless tobacco: Never  Tobacco comments:    None since 2009   Substance and Sexual Activity   Alcohol use: No    Comment: former   Drug use: No    Types: "Crack" cocaine    Comment: Crack cocaine; none since Jan 2006    Sexual activity: Not on file  Other Topics Concern   Not on file  Social History Narrative   Regular exercise:  Walks 5-7 x weekly   Caffeine use:  1 daily   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   Enjoys reading, sewing, Fill in puzzles, walking   Completed GED.   No pets.               Social Drivers of Corporate investment banker Strain: Low Risk  (02/21/2021)   Overall Financial Resource Strain (CARDIA)    Difficulty of Paying Living Expenses: Not very hard  Food  Insecurity: No Food Insecurity (02/21/2021)   Hunger Vital Sign    Worried About Running Out of Food in the Last Year: Never true    Ran Out of Food in the Last Year: Never true  Transportation Needs: No Transportation Needs (02/21/2021)   PRAPARE - Administrator, Civil Service (Medical): No    Lack of Transportation (Non-Medical): No  Physical Activity: Insufficiently Active (02/21/2021)   Exercise Vital Sign    Days of Exercise per Week: 4 days    Minutes of Exercise per Session: 20 min  Stress: No Stress Concern Present (02/21/2021)   [employer] of Occupational Health - Occupational Stress Questionnaire    Feeling of Stress : Not at all  Social Connections: Moderately Isolated (02/21/2021)   Social Connection and Isolation Panel [NHANES]    Frequency of Communication with Friends and Family: More than three times a week    Frequency of Social Gatherings with Friends and Family: Twice a week    Attends Religious Services: More than 4 times per year    Active Member of [employer] or Organizations: No    Attends Engineer, structural: Not on file    Marital Status: Never married  Intimate Partner Violence: Not on file    Outpatient Medications Prior to Visit  Medication Sig Dispense Refill   amLODipine  (NORVASC ) 5 MG tablet Take 1 tablet (5 mg total) by mouth daily. 90 tablet 3   aspirin  EC 81 MG tablet Take 1 tablet (81 mg total) by mouth daily. 90 tablet 3   fluticasone  (FLONASE ) 50 MCG/ACT nasal spray Place 2 sprays into both nostrils daily. 16 g 6   nitroGLYCERIN  (NITROSTAT ) 0.4 MG SL tablet Place 1 tablet (0.4 mg total) under the tongue every 5 (five) minutes x 3 doses as needed for chest pain. 25 tablet 6   benzonatate  (TESSALON ) 100 MG capsule Take 1-2 capsules (100-200 mg total) by mouth 3 (three) times daily as needed. 30 capsule 0   ipratropium (ATROVENT ) 0.03 % nasal spray Place 2 sprays into both nostrils every 12 (twelve) hours. 30 mL 0   methocarbamol   (ROBAXIN ) 500 MG tablet Take 1 tablet (500 mg total) by mouth every 8 (eight) hours as needed for muscle spasms. 15 tablet 0   No facility-administered medications prior to visit.    Allergies  Allergen Reactions   Ciprofloxacin Other (See Comments)    REACTION: nausea, sweating, chest tightness   Atorvastatin  Other (See Comments)  Myalgia    Crestor  [Rosuvastatin  Calcium ] Nausea Only   Nexlizet  [Bempedoic Acid-Ezetimibe ] Other (See Comments)    myalgia   Livalo  [Pitavastatin ] Other (See Comments)    myalgia   Praluent  [Alirocumab ] Other (See Comments)    Muscle aches    ROS    See HPI Objective:     Physical Exam Constitutional:      General: She is not in acute distress.    Appearance: Normal appearance. She is well-developed.  HENT:     Head: Normocephalic and atraumatic.     Right Ear: External ear normal.     Left Ear: External ear normal.  Eyes:     General: No scleral icterus. Neck:     Thyroid : No thyromegaly.  Cardiovascular:     Rate and Rhythm: Normal rate and regular rhythm.     Heart sounds: Normal heart sounds. No murmur heard. Pulmonary:     Effort: Pulmonary effort is normal. No respiratory distress.     Breath sounds: Normal breath sounds. No wheezing.  Musculoskeletal:     Cervical back: Neck supple.  Skin:    General: Skin is warm and dry.  Neurological:     Mental Status: She is alert and oriented to person, place, and time.  Psychiatric:        Mood and Affect: Mood normal.        Behavior: Behavior normal.        Thought Content: Thought content normal.        Judgment: Judgment normal.      BP 136/65   Pulse 87   Temp 98.4 F (36.9 C) (Oral)   Resp 16   Ht 5\' 6"  (1.676 m)   Wt 223 lb (101.2 kg)   SpO2 100%   BMI 35.99 kg/m  Wt Readings from Last 3 Encounters:  02/19/24 223 lb (101.2 kg)  11/06/23 227 lb (103 kg)  11/03/23 227 lb (103 kg)       Assessment & Plan:   Problem List Items Addressed This Visit        Unprioritized   Hyperlipidemia    Intolerant to statin, repatha  and zetia . Continue low fat/low cholesterol diet.       Hyperglycemia   Lab Results  Component Value Date   HGBA1C 6.4 10/23/2023   Borderline DM2- continues to work on [employer], and exercise.      History of hypothyroidism - Primary   Lab Results  Component Value Date   TSH 2.68 10/23/2023   Not on synthroid . Last TSH normal.       Grief reaction   She is grieving the loss of her brother. She is looking forward to having a bbq with her family members in his honor.       Essential hypertension   Initial bp elevated. Repeat bp looked good.  BP Readings from Last 3 Encounters:  02/19/24 136/65  11/06/23 134/76  11/03/23 137/74   Continue amlodipine  5mg .        Chronic constipation    Chronic constipation post-hernia surgery. Cod liver oil provides slow relief. MiraLAX discussed as a tasteless, titratable option. Dulcolax suppository suggested for rectal hard stools. - Recommend MiraLAX, start with a capful, adjust for daily bowel movements. - Consider Dulcolax suppository for rectal hard stools.      Other Visit Diagnoses       Breast cancer screening by mammogram       Relevant Orders   MM 3D SCREENING MAMMOGRAM BILATERAL  BREAST       I have discontinued Latoi A. Wixom's methocarbamol , benzonatate , and ipratropium. I am also having her maintain her aspirin  EC, nitroGLYCERIN , amLODipine , and fluticasone .  No orders of the defined types were placed in this encounter.

## 2024-02-19 NOTE — Assessment & Plan Note (Signed)
  Intolerant to statin, repatha  and zetia . Continue low fat/low cholesterol diet.

## 2024-02-19 NOTE — Assessment & Plan Note (Addendum)
 Lab Results  Component Value Date   HGBA1C 6.4 10/23/2023   Borderline DM2- continues to work on [employer], and exercise.

## 2024-02-19 NOTE — Patient Instructions (Signed)
 VISIT SUMMARY:  Today, we discussed your constipation and hypertension management. We also reviewed your high cholesterol management and noted a benign lipoma. Additionally, we talked about the importance of regular mammograms.  YOUR PLAN:  CONSTIPATION: You have been experiencing chronic constipation since your hernia surgery eight years ago, with infrequent and hard bowel movements. -Start using MiraLAX. Begin with one capful and adjust the dose to achieve daily bowel movements. -Consider using a Dulcolax suppository if you have hard stools in the rectum.  HYPERTENSION: Your blood pressure readings today were 153/65 mmHg initially and 136/65 mmHg upon recheck. You are currently taking amlodipine  5 mg, which may be causing ankle swelling. -Monitor your blood pressure regularly.  GENERAL HEALTH MAINTENANCE: You are due for a mammogram. -Schedule a mammogram. We discussed the benefits of regular screening and 3D imaging.

## 2024-02-19 NOTE — Assessment & Plan Note (Addendum)
 Lab Results  Component Value Date   TSH 2.68 10/23/2023   Not on synthroid . Last TSH normal.

## 2024-02-19 NOTE — Assessment & Plan Note (Signed)
 She is grieving the loss of her brother. She is looking forward to having a bbq with her family members in his honor.

## 2024-02-19 NOTE — Assessment & Plan Note (Addendum)
 Initial bp elevated. Repeat bp looked good.  BP Readings from Last 3 Encounters:  02/19/24 136/65  11/06/23 134/76  11/03/23 137/74   Continue amlodipine  5mg .

## 2024-02-19 NOTE — Assessment & Plan Note (Signed)
  Chronic constipation post-hernia surgery. Cod liver oil provides slow relief. MiraLAX discussed as a tasteless, titratable option. Dulcolax suppository suggested for rectal hard stools. - Recommend MiraLAX, start with a capful, adjust for daily bowel movements. - Consider Dulcolax suppository for rectal hard stools.

## 2024-02-22 ENCOUNTER — Encounter: Payer: Self-pay | Admitting: Cardiovascular Disease

## 2024-02-27 ENCOUNTER — Telehealth: Admitting: Physician Assistant

## 2024-02-27 DIAGNOSIS — R3989 Other symptoms and signs involving the genitourinary system: Secondary | ICD-10-CM | POA: Diagnosis not present

## 2024-02-27 MED ORDER — SULFAMETHOXAZOLE-TRIMETHOPRIM 800-160 MG PO TABS: 1.0000 | ORAL_TABLET | Freq: Two times a day (BID) | ORAL | 0 refills | Status: DC

## 2024-02-27 NOTE — Progress Notes (Signed)
 Virtual Visit Consent   Kaitlyn Bowman, you are scheduled for a virtual visit with a [facility] provider today. Just as with appointments in the office, your consent must be obtained to participate. Your consent will be active for this visit and any virtual visit you may have with one of our providers in the next 365 days. If you have a MyChart account, a copy of this consent can be sent to you electronically.  As this is a virtual visit, video technology does not allow for your provider to perform a traditional examination. This may limit your provider's ability to fully assess your condition. If your provider identifies any concerns that need to be evaluated in person or the need to arrange testing (such as labs, EKG, etc.), we will make arrangements to do so. Although advances in technology are sophisticated, we cannot ensure that it will always work on either your end or our end. If the connection with a video visit is poor, the visit may have to be switched to a telephone visit. With either a video or telephone visit, we are not always able to ensure that we have a secure connection.  By engaging in this virtual visit, you consent to the provision of healthcare and authorize for your insurance to be billed (if applicable) for the services provided during this visit. Depending on your insurance coverage, you may receive a charge related to this service.  I need to obtain your verbal consent now. Are you willing to proceed with your visit today? Kaitlyn Bowman has provided verbal consent on 02/27/2024 for a virtual visit (video or telephone). Kaitlyn Kelp, PA-C  Date: 02/27/2024 7:28 PM   Virtual Visit via Video Note   I, Kaitlyn Bowman, connected with  Kaitlyn Bowman  ([id number], 04-13-1961) on 02/27/24 at  7:15 PM EDT by a video-enabled telemedicine application and verified that I am speaking with the correct person using two identifiers.  Location: Patient: Virtual Visit Location  Patient: Home Provider: Virtual Visit Location Provider: Home Office   I discussed the limitations of evaluation and management by telemedicine and the availability of in person appointments. The patient expressed understanding and agreed to proceed.    History of Present Illness: Kaitlyn Bowman is a 63 y.o. who identifies as a female who was assigned female at birth, and is being seen today for dysuria and urine frequency.  HPI: Urinary Tract Infection  This is a new problem. The problem occurs every urination. The problem has been gradually worsening. The quality of the pain is described as burning. The pain is mild. There has been no fever. She is Not sexually active. There is No history of pyelonephritis. Associated symptoms include frequency, hesitancy and urgency. Pertinent negatives include no chills, discharge, flank pain, hematuria, nausea, sweats or vomiting. Associated symptoms comments: Urine has a foul odor, mild fatigue, cloudy. She has tried increased fluids and acetaminophen  for the symptoms. The treatment provided no relief.     Problems:  Patient Active Problem List   Diagnosis Date Noted   Chronic constipation 02/19/2024   History of umbilical hernia repair 01/23/2023   Dysfunction of both eustachian tubes 11/21/2022   Allergic rhinitis 11/21/2022   Hyperglycemia 05/01/2022   Mass of axillary tail of right breast 11/29/2021   Mass of subcutaneous tissue of back 07/26/2021   Statin myopathy 10/19/2020   Osteopenia 06/15/2020   Essential hypertension 06/15/2020   OA (osteoarthritis) of knee 02/22/2017   Wrist pain, right  09/30/2014   Grief reaction 07/13/2014   Plantar fasciitis 05/11/2014   Left leg pain 05/11/2014   Umbilical hernia 05/20/2012   General medical examination 02/21/2012   CAD (coronary artery disease) 01/30/2012   Seasonal allergies 01/24/2012   Inferior MI-status post stenting 4/13 01/14/2012   Hyperlipidemia 01/14/2012   History of hypothyroidism  01/14/2012   SUBSTANCE ABUSE 07/06/2010    Allergies:  Allergies  Allergen Reactions   Ciprofloxacin Other (See Comments)    REACTION: nausea, sweating, chest tightness   Atorvastatin  Other (See Comments)    Myalgia    Crestor  [Rosuvastatin  Calcium ] Nausea Only   Nexlizet  [Bempedoic Acid-Ezetimibe ] Other (See Comments)    myalgia   Livalo  [Pitavastatin ] Other (See Comments)    myalgia   Praluent  [Alirocumab ] Other (See Comments)    Muscle aches   Medications:  Current Outpatient Medications:    sulfamethoxazole-trimethoprim (BACTRIM DS) 800-160 MG tablet, Take 1 tablet by mouth 2 (two) times daily., Disp: 10 tablet, Rfl: 0   amLODipine  (NORVASC ) 5 MG tablet, Take 1 tablet (5 mg total) by mouth daily., Disp: 90 tablet, Rfl: 3   aspirin  EC 81 MG tablet, Take 1 tablet (81 mg total) by mouth daily., Disp: 90 tablet, Rfl: 3   fluticasone  (FLONASE ) 50 MCG/ACT nasal spray, Place 2 sprays into both nostrils daily., Disp: 16 g, Rfl: 6   nitroGLYCERIN  (NITROSTAT ) 0.4 MG SL tablet, Place 1 tablet (0.4 mg total) under the tongue every 5 (five) minutes x 3 doses as needed for chest pain., Disp: 25 tablet, Rfl: 6  Observations/Objective: Patient is well-developed, well-nourished in no acute distress.  Resting comfortably at home.  Head is normocephalic, atraumatic.  No labored breathing.  Speech is clear and coherent with logical content.  Patient is alert and oriented at baseline.    Assessment and Plan: 1. Suspected UTI (Primary) - sulfamethoxazole-trimethoprim (BACTRIM DS) 800-160 MG tablet; Take 1 tablet by mouth 2 (two) times daily.  Dispense: 10 tablet; Refill: 0  - Worsening symptoms.  - Will treat empirically with Bactrim - May use AZO for bladder spasms - Continue to push fluids.  - Seek in person evaluation for urine culture if symptoms do not improve or if they worsen.    Follow Up Instructions: I discussed the assessment and treatment plan with the patient. The patient  was provided an opportunity to ask questions and all were answered. The patient agreed with the plan and demonstrated an understanding of the instructions.  A copy of instructions were sent to the patient via MyChart unless otherwise noted below.    The patient was advised to call back or seek an in-person evaluation if the symptoms worsen or if the condition fails to improve as anticipated.    Kaitlyn Kelp, PA-C

## 2024-02-27 NOTE — Telephone Encounter (Signed)
 Called patient to discuss MyChart message.   Patient states on 02/19/24 she ate a bag of porkskins and barbecue, afterwards she felt her blood pressure was elevated and she had a headache. On this day she reports BP was 132/70.  She reports she feels better now. Denies any chest pain, SOB, headache, dizziness. She is concerned her dose of amlodipine  is too high at 5mg  daily. She has been taking 2.5 mg since 5/13 and reports BP ranging 130's/60-70's. She did take a whole tablet this morning.  She states she has lost 2 lbs and is eating better. She states she just "doesn't feel right" and is requesting to see Dr. Abel Hoe to discuss. She could not describe how she is feeling and again requested a visit.  Informed patient I will forward message to Dr. Abel Hoe to review and advise if medication adjustment is needed or if she needs to come in for an office visit.   Advised patient on ED precautions if she experiences CP, SOB, or any symptoms similar to her previous heart attack. Patient verbalized understanding.  Patient also requested for Dr. Abel Hoe to review the notes/test from her ED visit on 11/03/23 for a fall she had at work. She states she was told everything is fine, but she has not "felt right" since then.  If office visit is needed she requests to be scheduled on a Monday or Tuesday.

## 2024-02-27 NOTE — Patient Instructions (Signed)
 Kaitlyn Bowman, thank you for joining Angelia Kelp, PA-C for today's virtual visit.  While this provider is not your primary care provider (PCP), if your PCP is located in our provider database this encounter information will be shared with them immediately following your visit.   A [facility] MyChart account gives you access to today's visit and all your visits, tests, and labs performed at [facility] " click here if you don't have a [facility] MyChart account or go to [email or web address]  Consent: (Patient) Kaitlyn Bowman provided verbal consent for this virtual visit at the beginning of the encounter.  Current Medications:  Current Outpatient Medications:    sulfamethoxazole-trimethoprim (BACTRIM DS) 800-160 MG tablet, Take 1 tablet by mouth 2 (two) times daily., Disp: 10 tablet, Rfl: 0   amLODipine  (NORVASC ) 5 MG tablet, Take 1 tablet (5 mg total) by mouth daily., Disp: 90 tablet, Rfl: 3   aspirin  EC 81 MG tablet, Take 1 tablet (81 mg total) by mouth daily., Disp: 90 tablet, Rfl: 3   fluticasone  (FLONASE ) 50 MCG/ACT nasal spray, Place 2 sprays into both nostrils daily., Disp: 16 g, Rfl: 6   nitroGLYCERIN  (NITROSTAT ) 0.4 MG SL tablet, Place 1 tablet (0.4 mg total) under the tongue every 5 (five) minutes x 3 doses as needed for chest pain., Disp: 25 tablet, Rfl: 6   Medications ordered in this encounter:  Meds ordered this encounter  Medications   sulfamethoxazole-trimethoprim (BACTRIM DS) 800-160 MG tablet    Sig: Take 1 tablet by mouth 2 (two) times daily.    Dispense:  10 tablet    Refill:  0    Supervising Provider:   Corine Dice [[id number]]     *If you need refills on other medications prior to your next appointment, please contact your pharmacy*  Follow-Up: Call back or seek an in-person evaluation if the symptoms worsen or if the condition fails to improve as anticipated.  [facility] [phone]  Other Instructions Urinary  Tract Infection, Female A urinary tract infection (UTI) is an infection in your urinary tract. The urinary tract is made up of organs that make, store, and get rid of pee (urine) in your body. These organs include: The kidneys. The ureters. The bladder. The urethra. What are the causes? Most UTIs are caused by germs called bacteria. They may be in or near your genitals. These germs grow and cause swelling in your urinary tract. What increases the risk? You're more likely to get a UTI if: You're a female. The urethra is shorter in females than in males. You have a soft tube called a catheter that drains your pee. You can't control when you pee or poop. You have trouble peeing because of: A kidney stone. A urinary blockage. A nerve condition that affects your bladder. Not getting enough to drink. You're sexually active. You use a birth control inside your vagina, like spermicide. You're pregnant. You have low levels of the hormone estrogen in your body. You're an older adult. You're also more likely to get a UTI if you have other health problems. These may include: Diabetes. A weak immune system. Your immune system is your body's defense system. Sickle cell disease. Injury of the spine. What are the signs or symptoms? Symptoms may include: Needing to pee right away. Peeing small amounts often. Pain or burning when you pee. Blood in your pee. Pee that smells bad or odd. Pain in your belly or lower back. You may also:  Feel confused. This may be the first symptom in older adults. Vomit. Not feel hungry. Feel tired or easily annoyed. Have a fever or chills. How is this diagnosed? A UTI is diagnosed based on your medical history and an exam. You may also have other tests. These may include: Pee tests. Blood tests. Tests for sexually transmitted infections (STIs). If you've had more than one UTI, you may need to have imaging studies done to find out why you keep getting  them. How is this treated? A UTI can be treated by: Taking antibiotics or other medicines. Drinking enough fluid to keep your pee pale yellow. In rare cases, a UTI can cause a very bad condition called sepsis. Sepsis may be treated in the hospital. Follow these instructions at home: Medicines Take your medicines only as told by your health care provider. If you were given antibiotics, take them as told by your provider. Do not stop taking them even if you start to feel better. General instructions Make sure you: Pee often and fully. Do not hold your pee for a long time. Wipe from front to back after you pee or poop. Use each tissue only once when you wipe. Pee after you have sex. Do not douche or use sprays or powders in your genital area. Contact a health care provider if: Your symptoms don't get better after 1-2 days of taking antibiotics. Your symptoms go away and then come back. You have a fever or chills. You vomit or feel like you may vomit. Get help right away if: You have very bad pain in your back or lower belly. You faint. This information is not intended to replace advice given to you by your health care provider. Make sure you discuss any questions you have with your health care provider. Document Revised: 05/03/2023 Document Reviewed: 12/29/2022 Elsevier Patient Education  2024 Elsevier Inc.   If you have been instructed to have an in-person evaluation today at a local Urgent Care facility, please use the link below. It will take you to a list of all of our available [facility], including address, phone number and hours of operation. Please do not delay care.  [facility]  If you or a family member do not have a primary care provider, use the link below to schedule a visit and establish care. When you choose a [facility] primary care physician or advanced practice provider, you gain a long-term partner in health. Find a Primary Care  Provider  Learn more about [facility]'s in-office and virtual care options: [facility] - Get Care Now

## 2024-03-11 ENCOUNTER — Ambulatory Visit (HOSPITAL_BASED_OUTPATIENT_CLINIC_OR_DEPARTMENT_OTHER)
Admission: RE | Admit: 2024-03-11 | Discharge: 2024-03-11 | Disposition: A | Discharge status: Home / Self Care | Source: Ambulatory Visit | Attending: Family | Admitting: Family

## 2024-03-11 ENCOUNTER — Encounter (HOSPITAL_BASED_OUTPATIENT_CLINIC_OR_DEPARTMENT_OTHER): Payer: Self-pay

## 2024-03-11 DIAGNOSIS — Z1231 Encounter for screening mammogram for malignant neoplasm of breast: Secondary | ICD-10-CM | POA: Diagnosis not present

## 2024-03-18 ENCOUNTER — Encounter: Payer: Self-pay | Admitting: Family

## 2024-03-18 ENCOUNTER — Encounter: Payer: Self-pay | Admitting: Family Medicine

## 2024-04-29 ENCOUNTER — Encounter: Payer: Self-pay | Admitting: Cardiovascular Disease

## 2024-05-20 NOTE — Telephone Encounter (Signed)
 Left message for patient to call back, or reply to the mychart reply that was sent to her last week by Dr. Verlin.

## 2024-06-03 ENCOUNTER — Ambulatory Visit (INDEPENDENT_AMBULATORY_CARE_PROVIDER_SITE_OTHER): Admitting: Family

## 2024-06-03 VITALS — BP 140/69 | HR 62 | Temp 97.5°F | Resp 16 | Ht 66.0 in | Wt 221.0 lb

## 2024-06-03 DIAGNOSIS — M8580 Other specified disorders of bone density and structure, unspecified site: Secondary | ICD-10-CM

## 2024-06-03 DIAGNOSIS — M25562 Pain in left knee: Secondary | ICD-10-CM | POA: Diagnosis not present

## 2024-06-03 DIAGNOSIS — M25561 Pain in right knee: Secondary | ICD-10-CM | POA: Insufficient documentation

## 2024-06-03 DIAGNOSIS — I10 Essential (primary) hypertension: Secondary | ICD-10-CM | POA: Diagnosis not present

## 2024-06-03 DIAGNOSIS — E782 Mixed hyperlipidemia: Secondary | ICD-10-CM | POA: Diagnosis not present

## 2024-06-03 DIAGNOSIS — I2119 ST elevation (STEMI) myocardial infarction involving other coronary artery of inferior wall: Secondary | ICD-10-CM

## 2024-06-03 DIAGNOSIS — I2510 Atherosclerotic heart disease of native coronary artery without angina pectoris: Secondary | ICD-10-CM | POA: Diagnosis not present

## 2024-06-03 DIAGNOSIS — R739 Hyperglycemia, unspecified: Secondary | ICD-10-CM

## 2024-06-03 NOTE — Progress Notes (Signed)
 `[  Subjective:     Patient ID: Kaitlyn Bowman, female    DOB: [birth year], 63 y.o.   MRN: [id number]  Chief Complaint  Patient presents with   Hypertension    Here for follow up   Hypothyroidism    Here for follow up   Knee Pain    Patient complains of bilateral knee pain    Hypertension  Knee Pain     Discussed the use of AI scribe software for clinical note transcription with the patient, who gave verbal consent to proceed. Lab Results  Component Value Date   HGBA1C 6.4 10/23/2023    History of Present Illness Kaitlyn Bowman is a 63 year old female with coronary artery disease and hypertension who presents for evaluation of left knee pain and concerns about her cardiac stent.   She experiences persistent pain in her left knee, which is more severe than the right. She has received injections for knee pain in the past and is interested in another injection. She remains physically active, walking at work and using stairs regularly.  She had a cardiac stent placed approximately ten years ago and is concerned it has not been checked recently. She denies chest pain but feels 'weary' and wants to ensure there is no blockage. She takes amlodipine  5 mg daily for blood pressure management.      Health Maintenance Due  Topic Date Due   INFLUENZA VACCINE  05/09/2024    Past Medical History:  Diagnosis Date   Abnormal TSH    Alcohol abuse    Borderline diabetes    Cocaine abuse (HCC)    Remote crack cocaine use. UDS negative for cocaine 01/2012   Coronary artery disease     inferior STEMI 01/13/12 s/p PTCA/DES to mid RCA. Initial EF 45% by cath but improved to 55-60% by echo 01/14/12   Dyslipidemia    Trig 201, HDL 36, LDL 197 01/2012   GERD (gastroesophageal reflux disease)    History of MI (myocardial infarction) 01/2012   Hypothyroidism    Myocardial infarction [facility]) 2012   Umbilical hernia     Past Surgical History:  Procedure Laterality Date   ABDOMINAL HYSTERECTOMY   1981   partial   CARDIAC CATHETERIZATION  2010, 2013   Negative. 2013--100% blockage   HERNIA REPAIR  2017   abd hernia    LEFT HEART CATHETERIZATION WITH CORONARY ANGIOGRAM N/A 01/12/2012   Procedure: LEFT HEART CATHETERIZATION WITH CORONARY ANGIOGRAM;  Surgeon: Lonni JONETTA Cash, MD;  Location: [facility] C[facility] LAB;  Service: Cardiovascular;  Laterality: N/A;    Family History  Problem Relation Age of Onset   Heart attack Mother 35       prior pacemaker   Heart disease Mother    Diabetes Mother    Heart failure Father        CHF   Heart disease Father    Alcoholism Father    Stroke Brother    Diabetes Mellitus II Brother     Social History   Socioeconomic History   Marital status: Single    Spouse name: Not on file   Number of children: 1   Years of education: Not on file   Highest education level: GED or equivalent  Occupational History   Occupation: Event organiser: [employer]  Tobacco Use   Smoking status: Former    Current packs/day: 0.00    Types: Cigarettes    Start date: 06/14/1975    Quit date:  06/13/2008    Years since quitting: 15.9   Smokeless tobacco: Never   Tobacco comments:    None since 2009   Substance and Sexual Activity   Alcohol use: No    Comment: former   Drug use: No    Types: Crack cocaine    Comment: Crack cocaine; none since Jan 2006    Sexual activity: Not on file  Other Topics Concern   Not on file  Social History Narrative   Regular exercise:  Walks 5-7 x weekly   Caffeine use:  1 daily   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   Enjoys reading, sewing, Fill in puzzles, walking   Completed GED.   No pets.               Social Drivers of Corporate investment banker Strain: Low Risk  (02/21/2021)   Overall Financial Resource Strain (CARDIA)    Difficulty of Paying Living Expenses: Not very hard  Food Insecurity: No Food Insecurity (02/21/2021)    Hunger Vital Sign    Worried About Running Out of Food in the Last Year: Never true    Ran Out of Food in the Last Year: Never true  Transportation Needs: No Transportation Needs (02/21/2021)   PRAPARE - Administrator, Civil Service (Medical): No    Lack of Transportation (Non-Medical): No  Physical Activity: Insufficiently Active (02/21/2021)   Exercise Vital Sign    Days of Exercise per Week: 4 days    Minutes of Exercise per Session: 20 min  Stress: No Stress Concern Present (02/21/2021)   [employer] of Occupational Health - Occupational Stress Questionnaire    Feeling of Stress : Not at all  Social Connections: Moderately Isolated (02/21/2021)   Social Connection and Isolation Panel    Frequency of Communication with Friends and Family: More than three times a week    Frequency of Social Gatherings with Friends and Family: Twice a week    Attends Religious Services: More than 4 times per year    Active Member of [employer] or Organizations: No    Attends Engineer, structural: Not on file    Marital Status: Never married  Intimate Partner Violence: Not on file    Outpatient Medications Prior to Visit  Medication Sig Dispense Refill   amLODipine  (NORVASC ) 5 MG tablet Take 1 tablet (5 mg total) by mouth daily. 90 tablet 3   aspirin  EC 81 MG tablet Take 1 tablet (81 mg total) by mouth daily. 90 tablet 3   fluticasone  (FLONASE ) 50 MCG/ACT nasal spray Place 2 sprays into both nostrils daily. 16 g 6   nitroGLYCERIN  (NITROSTAT ) 0.4 MG SL tablet Place 1 tablet (0.4 mg total) under the tongue every 5 (five) minutes x 3 doses as needed for chest pain. 25 tablet 6   sulfamethoxazole -trimethoprim  (BACTRIM  DS) 800-160 MG tablet Take 1 tablet by mouth 2 (two) times daily. 10 tablet 0   No facility-administered medications prior to visit.    Allergies  Allergen Reactions   Ciprofloxacin Other (See Comments)    REACTION: nausea, sweating, chest tightness   Atorvastatin   Other (See Comments)    Myalgia    Crestor  [Rosuvastatin  Calcium ] Nausea Only   Nexlizet  [Bempedoic Acid-Ezetimibe ] Other (See Comments)    myalgia   Livalo  [Pitavastatin ] Other (See Comments)    myalgia   Praluent  [Alirocumab ]  Other (See Comments)    Muscle aches    ROS See HPI    Objective:    Physical Exam Constitutional:      General: She is not in acute distress.    Appearance: Normal appearance. She is well-developed.  HENT:     Head: Normocephalic and atraumatic.     Right Ear: External ear normal.     Left Ear: External ear normal.  Eyes:     General: No scleral icterus. Neck:     Thyroid : No thyromegaly.  Cardiovascular:     Rate and Rhythm: Normal rate and regular rhythm.     Heart sounds: Normal heart sounds. No murmur heard. Pulmonary:     Effort: Pulmonary effort is normal. No respiratory distress.     Breath sounds: Normal breath sounds. No wheezing.  Musculoskeletal:     Cervical back: Neck supple.  Skin:    General: Skin is warm and dry.  Neurological:     Mental Status: She is alert and oriented to person, place, and time.  Psychiatric:        Mood and Affect: Mood normal.        Behavior: Behavior normal.        Thought Content: Thought content normal.        Judgment: Judgment normal.      BP (!) 140/69 (BP Location: Right Arm, Patient Position: Sitting, Cuff Size: Large)   Pulse 62   Temp (!) 97.5 F (36.4 C) (Oral)   Resp 16   Ht 5' 6 (1.676 m)   Wt 221 lb (100.2 kg)   SpO2 99%   BMI 35.67 kg/m  Wt Readings from Last 3 Encounters:  06/03/24 221 lb (100.2 kg)  02/19/24 223 lb (101.2 kg)  11/06/23 227 lb (103 kg)       Assessment & Plan:   Problem List Items Addressed This Visit       Unprioritized   Pain in both knees   Has see Dr. Cleatrice in the past. Uncontrolled. Refer back to sports med.       Relevant Orders   Ambulatory referral to Sports Medicine   Osteopenia   Relevant Orders   DG Bone Density    Hyperlipidemia   Lab Results  Component Value Date   CHOL 286 (H) 10/23/2023   HDL 40.70 10/23/2023   LDLCALC 204 (H) 10/23/2023   LDLDIRECT 130.0 07/26/2021   TRIG 208.0 (H) 10/23/2023   CHOLHDL 7 10/23/2023   Unfortunately she has been intolerant to statins, nexlizet  and repatha . Continue to work on diet/exercise and weight loss.       Hyperglycemia   Lab Results  Component Value Date   HGBA1C 6.4 10/23/2023   Borderline DM- update A1C today.       Relevant Orders   Basic Metabolic Panel (BMET)   HgB A1c   Essential hypertension   BP elevated slightly- recommended increase amlodipine  form 5 mg to 7.5 mg but she declines. Prefers to recheck in 3 months.       CAD (coronary artery disease) - Primary   Clinically stable. Will request routine follow up visit with cardiology.       Relevant Orders   Ambulatory referral to Cardiology    I have discontinued Analys A. Boeder's sulfamethoxazole -trimethoprim . I am also having her maintain her aspirin  EC, nitroGLYCERIN , amLODipine , and fluticasone .  No orders of the defined types were placed in this encounter.

## 2024-06-03 NOTE — Assessment & Plan Note (Addendum)
 BP elevated slightly- recommended increase amlodipine  form 5 mg to 7.5 mg but she declines. Prefers to recheck in 3 months.

## 2024-06-03 NOTE — Assessment & Plan Note (Addendum)
 Lab Results  Component Value Date   CHOL 286 (H) 10/23/2023   HDL 40.70 10/23/2023   LDLCALC 204 (H) 10/23/2023   LDLDIRECT 130.0 07/26/2021   TRIG 208.0 (H) 10/23/2023   CHOLHDL 7 10/23/2023   Unfortunately she has been intolerant to statins, nexlizet  and repatha . Continue to work on diet/exercise and weight loss.

## 2024-06-03 NOTE — Assessment & Plan Note (Signed)
 Has see Dr. Cleatrice in the past. Uncontrolled. Refer back to sports med.

## 2024-06-03 NOTE — Patient Instructions (Signed)
 VISIT SUMMARY:  Today, we discussed your left knee pain, concerns about your cardiac stent, and reviewed your overall health including hypertension, type 2 diabetes, and osteopenia.  YOUR PLAN:  BILATERAL KNEE PAIN: You have chronic pain in both knees, with the left knee being more symptomatic. -You will be referred to Dr. Cleatrice for knee pain management.  HYPERTENSION: Your blood pressure is being managed with amlodipine , but we discussed the possibility of increasing the dosage if needed. -Recheck your blood pressure in 3 months. -Continue with a low sodium diet, aim for weight loss, and maintain regular exercise.  TYPE 2 DIABETES MELLITUS: Your A1c level is 6.5%. We discussed the importance of diet and exercise for managing your blood sugar. -Check your A1c again in 3 months. -Continue to focus on diet and exercise to manage your blood sugar levels.  CORONARY ARTERY DISEASE WITH STENT PLACEMENT: You had a coronary artery stent placed 10 years ago and have no current chest pain. We discussed the possibility of a stress test if symptoms develop. -You will be referred to Dr. Patsie for follow-up on your coronary artery stent.  OSTEOPENIA: You have mild osteopenia and your bone density has not been checked recently. -A bone density test will be ordered.  LIPOMATOUS MASSES: You have lipomatous masses that cause soreness when you sleep on them. -Monitor the soreness and let us  know if it worsens.  GENERAL HEALTH MAINTENANCE: We discussed general health maintenance and you declined the pneumonia vaccination. -Continue regular physical activity. -Consider joining the [facility] for exercise.

## 2024-06-03 NOTE — Assessment & Plan Note (Signed)
 Lab Results  Component Value Date   HGBA1C 6.4 10/23/2023   Borderline DM- update A1C today.

## 2024-06-03 NOTE — Assessment & Plan Note (Signed)
 Clinically stable. Will request routine follow up visit with cardiology.

## 2024-06-04 LAB — BASIC METABOLIC PANEL WITH GFR
BUN: 9 mg/dL (ref 6–23)
CO2: 25 meq/L (ref 19–32)
Calcium: 9.7 mg/dL (ref 8.4–10.5)
Chloride: 103 meq/L (ref 96–112)
Creatinine, Ser: 0.78 mg/dL (ref 0.40–1.20)
GFR: 80.62 mL/min (ref >60.00)
Glucose, Bld: 79 mg/dL (ref 70–99)
Potassium: 4.6 meq/L (ref 3.5–5.1)
Sodium: 140 meq/L (ref 135–145)

## 2024-06-04 LAB — HEMOGLOBIN A1C: Hgb A1c MFr Bld: 6.6 % — ABNORMAL HIGH (ref 4.6–6.5)

## 2024-06-05 ENCOUNTER — Ambulatory Visit: Payer: Self-pay | Admitting: Family

## 2024-06-10 ENCOUNTER — Ambulatory Visit

## 2024-06-23 ENCOUNTER — Ambulatory Visit (INDEPENDENT_AMBULATORY_CARE_PROVIDER_SITE_OTHER): Admitting: Family Medicine

## 2024-06-23 VITALS — BP 122/76 | Ht 66.0 in | Wt 222.0 lb

## 2024-06-23 DIAGNOSIS — M25562 Pain in left knee: Secondary | ICD-10-CM | POA: Diagnosis not present

## 2024-06-23 DIAGNOSIS — M25561 Pain in right knee: Secondary | ICD-10-CM

## 2024-06-23 MED ORDER — METHYLPREDNISOLONE ACETATE 80 MG/ML IJ SUSP
80.0000 mg | Freq: Once | INTRAMUSCULAR | Status: AC
  Administered 2024-06-23: 80 mg via INTRAMUSCULAR

## 2024-06-23 MED ORDER — KETOROLAC TROMETHAMINE 60 MG/2ML IM SOLN
60.0000 mg | Freq: Once | INTRAMUSCULAR | Status: AC
  Administered 2024-06-23: 60 mg via INTRAMUSCULAR

## 2024-06-23 NOTE — Patient Instructions (Signed)
 You were given IM injections of an anti-inflammatory and a steroid today. Your pain is due to arthritis. These are the different medications you can take for this: Tylenol  500mg  1-2 tabs three times a day for pain. Voltaren gel, capsaicin, aspercreme, or biofreeze topically up to four times a day may also help with pain. Some supplements that may help for arthritis: Boswellia extract, curcumin, pycnogenol Aleve  1-2 tabs twice a day with food Cortisone injections are an option. If cortisone injections do not help, there are different types of shots that may help but they take longer to take effect. It's important that you continue to stay active. Straight leg raises, knee extensions 3 sets of 10 once a day (add ankle weight if these become too easy). Consider physical therapy to strengthen muscles around the joint that hurts to take pressure off of the joint itself. Shoe inserts with good arch support may be helpful. Heat or ice 15 minutes at a time 3-4 times a day as needed to help with pain. Water aerobics and cycling with low resistance are the best two types of exercise for arthritis though any exercise is ok as long as it doesn't worsen the pain. Follow up with me as needed.

## 2024-06-23 NOTE — Progress Notes (Cosign Needed)
 PCP: Daryl Setter, NP  Subjective:   HPI: Patient is a 63 y.o. female here for bilateral knee pain.  Patient states she has had bilateral knee pain for many months but it has worsened over the last few weeks because she has started walking more and climbing stairs. The pain is mainly when she is walking and with diffuse anterior palpation of her knees. Patient has been taking Tylenol  and applying a topical cream with some improvement.  She last received IM Toradol  and Depo-Medrol  January 2025 and that relief lasted several months.  She does not desire intra-articular knee injections because she had a lot of pain in the past when she received one. No recent injury.   Past Medical History:  Diagnosis Date   Abnormal TSH    Alcohol abuse    Borderline diabetes    Cocaine abuse (HCC)    Remote crack cocaine use. UDS negative for cocaine 01/2012   Coronary artery disease     inferior STEMI 01/13/12 s/p PTCA/DES to mid RCA. Initial EF 45% by cath but improved to 55-60% by echo 01/14/12   Dyslipidemia    Trig 201, HDL 36, LDL 197 01/2012   GERD (gastroesophageal reflux disease)    History of MI (myocardial infarction) 01/2012   Hypothyroidism    Myocardial infarction [facility]) 2012   Umbilical hernia     Current Outpatient Medications on File Prior to Visit  Medication Sig Dispense Refill   amLODipine  (NORVASC ) 5 MG tablet Take 1 tablet (5 mg total) by mouth daily. 90 tablet 3   aspirin  EC 81 MG tablet Take 1 tablet (81 mg total) by mouth daily. 90 tablet 3   fluticasone  (FLONASE ) 50 MCG/ACT nasal spray Place 2 sprays into both nostrils daily. 16 g 6   nitroGLYCERIN  (NITROSTAT ) 0.4 MG SL tablet Place 1 tablet (0.4 mg total) under the tongue every 5 (five) minutes x 3 doses as needed for chest pain. 25 tablet 6   No current facility-administered medications on file prior to visit.    Past Surgical History:  Procedure Laterality Date   ABDOMINAL HYSTERECTOMY  1981   partial   CARDIAC  CATHETERIZATION  2010, 2013   Negative. 2013--100% blockage   HERNIA REPAIR  2017   abd hernia    LEFT HEART CATHETERIZATION WITH CORONARY ANGIOGRAM N/A 01/12/2012   Procedure: LEFT HEART CATHETERIZATION WITH CORONARY ANGIOGRAM;  Surgeon: Lonni JONETTA Cash, MD;  Location: [facility] CATH LAB;  Service: Cardiovascular;  Laterality: N/A;    Allergies  Allergen Reactions   Ciprofloxacin Other (See Comments)    REACTION: nausea, sweating, chest tightness   Atorvastatin  Other (See Comments)    Myalgia    Crestor  [Rosuvastatin  Calcium ] Nausea Only   Nexlizet  [Bempedoic Acid-Ezetimibe ] Other (See Comments)    myalgia   Livalo  [Pitavastatin ] Other (See Comments)    myalgia   Praluent  [Alirocumab ] Other (See Comments)    Muscle aches    BP 122/76   Ht 5' 6 (1.676 m)   Wt 222 lb (100.7 kg)   BMI 35.83 kg/m       No data to display              No data to display              Objective:  Physical Exam:  Gen: NAD, comfortable in exam room KNEE: No overlying skin changes or swelling. No obvious deformity. Bilateral knees FROM without pain but crepitus noted with flexion and extension bilaterally. TTP over  bilateral patella. No medial or lateral joint line tenderness. Mild pain with valgus stress bilaterally. Walking independently without limp.   Assessment & Plan:  1. Bilateral knee pain secondary to osteoarthritis Suspect exacerbation of OA pain due to more frequent walking and stair climbing. No new injury. No indication for imaging at this time.  - IM Toradol  and depo-medrol , pt declined intraarticular steroid injection - Continue Tylenol /NSAIDs, ice, topical creams  - Continue exercising and focus on knee strengthening (knee extensions, straight leg raises, water aerobics) - Follow up PRN

## 2024-07-14 ENCOUNTER — Ambulatory Visit (HOSPITAL_BASED_OUTPATIENT_CLINIC_OR_DEPARTMENT_OTHER)
Admission: RE | Admit: 2024-07-14 | Discharge: 2024-07-14 | Disposition: A | Discharge status: Home / Self Care | Source: Ambulatory Visit | Attending: Family | Admitting: Family

## 2024-07-14 DIAGNOSIS — M8580 Other specified disorders of bone density and structure, unspecified site: Secondary | ICD-10-CM | POA: Diagnosis present

## 2024-07-15 ENCOUNTER — Encounter: Payer: Self-pay | Admitting: Family

## 2024-08-04 ENCOUNTER — Other Ambulatory Visit: Payer: Self-pay

## 2024-08-06 MED ORDER — AMLODIPINE BESYLATE 5 MG PO TABS: 5.0000 mg | ORAL_TABLET | Freq: Every day | ORAL | 0 refills | Status: DC

## 2024-09-02 ENCOUNTER — Other Ambulatory Visit: Payer: Self-pay | Admitting: Cardiovascular Disease

## 2024-09-02 ENCOUNTER — Ambulatory Visit: Admitting: Family

## 2024-09-24 ENCOUNTER — Other Ambulatory Visit: Payer: Self-pay | Admitting: Cardiovascular Disease

## 2024-09-24 NOTE — Telephone Encounter (Signed)
 PATIENT MUST MAKE APPT FOR FURTHER REFILLS [phone] 1st ATTEMPT

## 2024-10-14 ENCOUNTER — Ambulatory Visit: Admitting: Family

## 2024-11-05 ENCOUNTER — Ambulatory Visit: Admitting: Family

## 2024-12-02 ENCOUNTER — Encounter: Payer: Self-pay | Admitting: Family
